# Patient Record
Sex: Female | Born: 1935 | Race: Black or African American | Hispanic: No | Marital: Single | Smoking: Former smoker
Health system: Southern US, Community
[De-identification: ages and names within clinical notes are randomized; demographics above are authoritative.]

## PROBLEM LIST (undated history)

## (undated) DIAGNOSIS — I10 Essential (primary) hypertension: Secondary | ICD-10-CM

## (undated) DIAGNOSIS — R0602 Shortness of breath: Secondary | ICD-10-CM

## (undated) DIAGNOSIS — Z862 Personal history of diseases of the blood and blood-forming organs and certain disorders involving the immune mechanism: Secondary | ICD-10-CM

## (undated) DIAGNOSIS — N189 Chronic kidney disease, unspecified: Secondary | ICD-10-CM

## (undated) DIAGNOSIS — F0280 Dementia in other diseases classified elsewhere without behavioral disturbance: Secondary | ICD-10-CM

## (undated) DIAGNOSIS — I739 Peripheral vascular disease, unspecified: Secondary | ICD-10-CM

## (undated) DIAGNOSIS — D631 Anemia in chronic kidney disease: Secondary | ICD-10-CM

## (undated) DIAGNOSIS — G309 Alzheimer's disease, unspecified: Secondary | ICD-10-CM

## (undated) DIAGNOSIS — I779 Disorder of arteries and arterioles, unspecified: Secondary | ICD-10-CM

## (undated) DIAGNOSIS — E785 Hyperlipidemia, unspecified: Secondary | ICD-10-CM

## (undated) DIAGNOSIS — N9089 Other specified noninflammatory disorders of vulva and perineum: Secondary | ICD-10-CM

## (undated) DIAGNOSIS — I6381 Other cerebral infarction due to occlusion or stenosis of small artery: Secondary | ICD-10-CM

## (undated) DIAGNOSIS — Z951 Presence of aortocoronary bypass graft: Secondary | ICD-10-CM

## (undated) DIAGNOSIS — N186 End stage renal disease: Secondary | ICD-10-CM

## (undated) DIAGNOSIS — I129 Hypertensive chronic kidney disease with stage 1 through stage 4 chronic kidney disease, or unspecified chronic kidney disease: Secondary | ICD-10-CM

## (undated) DIAGNOSIS — I255 Ischemic cardiomyopathy: Secondary | ICD-10-CM

## (undated) DIAGNOSIS — Z8709 Personal history of other diseases of the respiratory system: Secondary | ICD-10-CM

## (undated) DIAGNOSIS — I2510 Atherosclerotic heart disease of native coronary artery without angina pectoris: Secondary | ICD-10-CM

## (undated) DIAGNOSIS — I340 Nonrheumatic mitral (valve) insufficiency: Secondary | ICD-10-CM

## (undated) DIAGNOSIS — M1990 Unspecified osteoarthritis, unspecified site: Secondary | ICD-10-CM

## (undated) DIAGNOSIS — I219 Acute myocardial infarction, unspecified: Secondary | ICD-10-CM

## (undated) HISTORY — DX: Hyperlipidemia, unspecified: E78.5

## (undated) HISTORY — DX: Personal history of diseases of the blood and blood-forming organs and certain disorders involving the immune mechanism: Z86.2

## (undated) HISTORY — DX: Disorder of arteries and arterioles, unspecified: I77.9

## (undated) HISTORY — DX: End stage renal disease: N18.6

## (undated) HISTORY — PX: OTHER SURGICAL HISTORY: SHX169

## (undated) HISTORY — DX: Atherosclerotic heart disease of native coronary artery without angina pectoris: I25.10

## (undated) HISTORY — DX: Personal history of other diseases of the respiratory system: Z87.09

## (undated) HISTORY — DX: Anemia in chronic kidney disease: D63.1

## (undated) HISTORY — DX: Peripheral vascular disease, unspecified: I73.9

## (undated) HISTORY — PX: CHOLECYSTECTOMY: SHX55

## (undated) HISTORY — DX: Chronic kidney disease, unspecified: N18.9

## (undated) HISTORY — DX: Other cerebral infarction due to occlusion or stenosis of small artery: I63.81

## (undated) HISTORY — DX: Nonrheumatic mitral (valve) insufficiency: I34.0

## (undated) HISTORY — DX: Ischemic cardiomyopathy: I25.5

## (undated) HISTORY — DX: Presence of aortocoronary bypass graft: Z95.1

## (undated) HISTORY — DX: Hypertensive chronic kidney disease with stage 1 through stage 4 chronic kidney disease, or unspecified chronic kidney disease: I12.9

## (undated) HISTORY — DX: Unspecified osteoarthritis, unspecified site: M19.90

---

## 1992-05-13 DIAGNOSIS — I6381 Other cerebral infarction due to occlusion or stenosis of small artery: Secondary | ICD-10-CM

## 1992-05-13 HISTORY — DX: Other cerebral infarction due to occlusion or stenosis of small artery: I63.81

## 1995-05-14 DIAGNOSIS — Z951 Presence of aortocoronary bypass graft: Secondary | ICD-10-CM

## 1995-05-14 DIAGNOSIS — I2510 Atherosclerotic heart disease of native coronary artery without angina pectoris: Secondary | ICD-10-CM

## 1995-05-14 HISTORY — DX: Atherosclerotic heart disease of native coronary artery without angina pectoris: I25.10

## 1995-05-14 HISTORY — PX: CORONARY ARTERY BYPASS GRAFT: SHX141

## 1995-05-14 HISTORY — DX: Presence of aortocoronary bypass graft: Z95.1

## 1995-12-03 DIAGNOSIS — I219 Acute myocardial infarction, unspecified: Secondary | ICD-10-CM

## 1995-12-03 HISTORY — DX: Acute myocardial infarction, unspecified: I21.9

## 1999-05-14 HISTORY — PX: AV FISTULA PLACEMENT: SHX1204

## 2004-08-06 ENCOUNTER — Encounter: Admission: RE | Admit: 2004-08-06 | Discharge: 2004-08-06 | Payer: Self-pay | Admitting: Nephrology

## 2006-10-08 ENCOUNTER — Encounter (HOSPITAL_COMMUNITY): Admission: RE | Admit: 2006-10-08 | Discharge: 2007-01-06 | Payer: Self-pay | Admitting: Nephrology

## 2006-12-03 ENCOUNTER — Ambulatory Visit: Payer: Self-pay | Admitting: Vascular Surgery

## 2006-12-17 ENCOUNTER — Ambulatory Visit (HOSPITAL_COMMUNITY): Admission: RE | Admit: 2006-12-17 | Discharge: 2006-12-17 | Payer: Self-pay | Admitting: Vascular Surgery

## 2006-12-17 ENCOUNTER — Ambulatory Visit: Payer: Self-pay | Admitting: Vascular Surgery

## 2007-01-27 ENCOUNTER — Ambulatory Visit: Payer: Self-pay | Admitting: Vascular Surgery

## 2008-01-24 ENCOUNTER — Inpatient Hospital Stay (HOSPITAL_COMMUNITY): Admission: EM | Admit: 2008-01-24 | Discharge: 2008-01-24 | Payer: Self-pay | Admitting: Emergency Medicine

## 2008-03-10 ENCOUNTER — Encounter: Admission: RE | Admit: 2008-03-10 | Discharge: 2008-03-10 | Payer: Self-pay | Admitting: Cardiovascular Disease

## 2008-03-13 DIAGNOSIS — I739 Peripheral vascular disease, unspecified: Secondary | ICD-10-CM

## 2008-03-13 HISTORY — PX: FEMORAL ARTERY STENT: SHX1583

## 2008-03-13 HISTORY — DX: Peripheral vascular disease, unspecified: I73.9

## 2008-03-29 ENCOUNTER — Inpatient Hospital Stay (HOSPITAL_COMMUNITY): Admission: RE | Admit: 2008-03-29 | Discharge: 2008-03-30 | Payer: Self-pay | Admitting: Cardiology

## 2009-03-16 ENCOUNTER — Ambulatory Visit (HOSPITAL_COMMUNITY): Admission: RE | Admit: 2009-03-16 | Discharge: 2009-03-16 | Payer: Self-pay | Admitting: Nephrology

## 2009-04-13 ENCOUNTER — Ambulatory Visit: Payer: Self-pay | Admitting: Vascular Surgery

## 2009-05-16 ENCOUNTER — Ambulatory Visit (HOSPITAL_COMMUNITY): Admission: RE | Admit: 2009-05-16 | Discharge: 2009-05-16 | Payer: Self-pay | Admitting: Vascular Surgery

## 2009-05-16 ENCOUNTER — Ambulatory Visit: Payer: Self-pay | Admitting: Surgery

## 2009-06-15 ENCOUNTER — Ambulatory Visit: Payer: Self-pay | Admitting: Vascular Surgery

## 2010-01-23 ENCOUNTER — Ambulatory Visit (HOSPITAL_COMMUNITY): Admission: RE | Admit: 2010-01-23 | Discharge: 2010-01-23 | Payer: Self-pay | Admitting: Nephrology

## 2010-03-01 ENCOUNTER — Ambulatory Visit: Payer: Self-pay | Admitting: Vascular Surgery

## 2010-03-13 ENCOUNTER — Ambulatory Visit: Payer: Self-pay | Admitting: Vascular Surgery

## 2010-03-13 ENCOUNTER — Ambulatory Visit (HOSPITAL_COMMUNITY): Admission: RE | Admit: 2010-03-13 | Discharge: 2010-03-13 | Payer: Self-pay | Admitting: Vascular Surgery

## 2010-03-29 ENCOUNTER — Ambulatory Visit: Payer: Self-pay | Admitting: Vascular Surgery

## 2010-05-31 ENCOUNTER — Ambulatory Visit
Admission: RE | Admit: 2010-05-31 | Discharge: 2010-05-31 | Payer: Self-pay | Source: Home / Self Care | Attending: Vascular Surgery | Admitting: Vascular Surgery

## 2010-06-01 NOTE — Assessment & Plan Note (Signed)
OFFICE VISIT  Courtney, [name] DOB:  04-27-1931                                       05/31/2010 ZOXWR#:[id number]  The patient returns for followup today after placement of her left brachiocephalic AV fistula on November 1.  She reports no signs of steal in left arm.  This was originally done in preparation for ligation of the right arm AV fistula which has aneurysmal degeneration.  PHYSICAL EXAM:  Blood pressure is 186/70 in the left arm, temperature is 97, heart rate 61 and regular.  Left upper extremity incision is well- healed.  There is an easily palpable thrill in the fistula.  There is an audible bruit.  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  Overall the left arm AV fistula appears to be maturing well at this point.  She will follow up with [place] in six weeks' time.  If they are successfully cannulating her fistula at that time we will consider ligating the right arm AV fistula.    [name] Garin Mata, MD Electronically Signed  CEF/MEDQ  D:  05/31/2010  T:  06/01/2010  Job:  [id number]  cc:   Terrial Rhodes, M.D.

## 2010-07-24 LAB — POCT I-STAT 4, (NA,K, GLUC, HGB,HCT): Potassium: 3.6 mEq/L (ref 3.5–5.1)

## 2010-07-26 ENCOUNTER — Ambulatory Visit: Payer: Self-pay | Admitting: Vascular Surgery

## 2010-07-29 LAB — POCT I-STAT 4, (NA,K, GLUC, HGB,HCT)
Glucose, Bld: 105 mg/dL — ABNORMAL HIGH (ref 70–99)
HCT: 45 % (ref 36.0–46.0)
Hemoglobin: 15.3 g/dL — ABNORMAL HIGH (ref 12.0–15.0)
Potassium: 4.4 mEq/L (ref 3.5–5.1)

## 2010-09-25 NOTE — Consult Note (Signed)
NAME:  Courtney [name], Courtney [name] NO.:  [id number]   MEDICAL RECORD NO.:  [id number]          PATIENT TYPE:  EMS   LOCATION:  MAJO                         FACILITY:  [facility]   PHYSICIAN:  Renee Ramus, MD       DATE OF BIRTH:  Jan 07, 1931   DATE OF CONSULTATION:  DATE OF DISCHARGE:                                 CONSULTATION   REASON FOR CONSULT:  The patient is a 77 year old female who complained  of some chest pressure that she says was consistent with previous  episodes of chest pressure.  The patient was at a wedding today.  She  did not take my heart pills, which probably means carvedilol.  The  patient had some chest pressure, but she noticed obliquely.  She stated  at the worse it was 4-5/10.  The patient reports that her family made  her go to the emergency department and she wants to go home.  The  patient denies any dyspnea, cough, diaphoresis, fever, chills, night  sweats, or PND.  The patient states that currently her pain is gone and  that she wishes to go home.  Called to see the patient for further  evaluation and treatment.   PAST MEDICAL HISTORY:  1. Chronic renal failure, hemodialysis dependent on Monday, Wednesday,      and Friday.  2. Coronary artery disease, status post three-vessel coronary artery      bypass graft.  3. Hypertension.  4. History of anemia.  5. Secondary hyperparathyroid.  6. Hyperlipidemia.  7. Glaucoma.  8. Gout.   FAMILY HISTORY:  Not available.   SOCIAL HISTORY:  No alcohol or tobacco use.  The patient lives with  family.   DRUG ALLERGIES:  NKDA.   CURRENT MEDICATIONS:  1. Carvedilol 12.5 mg 1 p.o. b.i.d.  2. Amlodipine, which has been discontinued.  3. Colchicine and allopurinol, which she takes p.r.n. only.  4. Crestor 20 mg p.o. daily.   The patient also takes other medications.  When questioned, she admits  to taking;  1. Nephro-Vite.  2. Renagel, but does not admit to taking aspirin.   REVIEW OF SYSTEMS:  All  other comprehensive review of systems are  negative.   PHYSICAL EXAMINATION:  GENERAL:  This is a well-developed, well-  nourished elderly black female currently in no apparent distress.  VITAL SIGNS:  Temperature 97.8, pulse 68, respiratory rate 25, blood  pressure 136/61.  HEENT:  No jugular venous distention or lymphadenopathy.  Oropharynx is  clear.  Mucous membranes pink and moist.  TMs are clear bilaterally.  Pupils equal, reactive to light and accommodation.  Extraocular muscles  are intact.  CARDIOVASCULAR:  Regular rate and rhythms without murmurs, rubs, or  gallops.  PULMONARY:  Lungs are clear to auscultation bilaterally.  ABDOMEN:  Soft, nontender, and nondistended without hepatosplenomegaly.  Bowel sounds are present.  She has no rebound or guarding.  EXTREMITIES:  She has no clubbing, cyanosis, or edema.  She has good  peripheral pulses in dorsalis pedis and radial artery.  She is able to  move all extremities.  NEUROLOGIC:  Cranial nerves  II through XII are grossly intact.  She has  no focal neurological deficits.   STUDIES:  1. EKG shows normal sinus rhythm with right atrial enlargement.  2. Portable chest x-ray shows no acute cardiopulmonary disease with      evidence of previous coronary artery bypass graft.   LABORATORY DATA:  White count 4.4, H&H 17.3 and 51, MCV 87, platelets  155.  Sodium 136, potassium 4.8, chloride 106, bicarb 20, BUN 37,  creatinine 7.8, glucose 115, troponin I 0.07.   ASSESSMENT AND PLAN:  1. Chest pain.  I do not believe this represents acute coronary      syndrome, briefly discussed with Cardiology and the patient is to      return home.  Follow up with primary care for any further risks      stratification.  We will treat this problem medically and if her      symptoms recur in any degree, she will return to the emergency      department.  Discussed this at length with the patient and the      patient's family.  They both agree that  this is the course of      action that they wish to take.  They understand it.  I am not      completely certain that her heart is not involved, but this is my      best estimate and they are very comfortable with that.  2. End-stage renal disease with hemodialysis dependent.  This is      stable.  She will continue with hemodialysis.  3. Coronary artery disease as above.  Would add aspirin 81 mg p.o.      daily to regimen.  4. Hypertension, currently stable.  5. Anemia.  The patient currently has erythrocytosis, which may or may      not be secondary to erythropoietin given dialysis.  She has a      history of anemia and this may or may not be the case.  In any      case, I will defer this to her nephrologist or primary care doctor      for further evaluation and treatment.  6. Secondary hyperparathyroidism.  I believe this is adequately      covered by her nephrologist.  7. Hyperlipidemia.  The patient will continue on Crestor.   DISPOSITION:  Discharged to home.   Consult note was constructed by reviewing past medical history,  confirming with emergency medical room physician, and reviewing the  emergency room medical records.   TIME SPENT:  One hour.      Renee Ramus, MD  Electronically Signed     JF/MEDQ  D:  01/23/2008  T:  01/24/2008  Job:  [id number]

## 2010-09-25 NOTE — Consult Note (Signed)
VASCULAR SURGERY CONSULTATION   Courtney [name], Courtney [name]  DOB:  12-10-1930                                       12/03/2006  LZJQB#:[id number]   I saw the patient in the office today in consultation for hemodialysis  access.   She was referred by Dr. Kathrene Bongo.   This is a pleasant 77 year old, left handed woman, with chronic renal  insufficiency secondary to hypertension.  Her renal function has been  gradually worsening and she was sent for hemodialysis access evaluation.   PAST MEDICAL HISTORY:  1. Hypertension.  2. Anemia.  3. Secondary hyperparathyroidism.   REVIEW OF SYSTEMS:  CARDIAC:  She does admit to some dyspnea on  exertion.  She has had no recent chest pain or chest pressure. Review of  systems is otherwise documented on her medical history form and on  chart.   PHYSICAL EXAMINATION:  Vital signs:  Blood pressure is 171/78 on the  left, 180/79 on the right.  Lungs:  Clear bilaterally to auscultation.  Cardiac:  She has a regular rate and rhythm.  Abdomen:  Soft and  nontender.  She has a palpable brachial and radial pulses bilaterally.   Vein mapping shows that the upper arm cephalic vein on the right is  reasonable size and she may be a candidate for an upper arm fistula.   I have recommended that we explore upper arm cephalic vein on the right.  If this is adequate, we will place an AV fistula.  If this is not  adequate, then we would place an AV graft.  We have discussed the  indications for the procedure and the potential complications including,  but not limited to, failure of the fistula to mature, graft thrombosis,  graft infection, Steel syndrome.  All of her questions were answered and  she is agreeable to proceed.   Her surgery has been scheduled for Wednesday, August 6th.   Di Kindle. Edilia Bo, M.D.  Electronically Signed  CSD/MEDQ  D:  12/03/2006  T:  12/04/2006  Job:  [id number]

## 2010-09-25 NOTE — Procedures (Signed)
CEPHALIC VEIN MAPPING   INDICATION:  End-stage renal disease.   HISTORY:   EXAM:  Map, right upper extremity, for possible AVF.   The right cephalic vein is compressible.   Diameter measurements range from 0.17 to 0.33 cm in the forearm.  The  cephalic vein measures 0.3 cm to 0.33 cm in the upper arm.   The left cephalic vein is not evaluated.  See attached worksheet for all  measurements.   See attached worksheet for all measurements.   IMPRESSION:  Patient right cephalic vein which is of acceptable diameter  for use as a dialysis access site in the upper arm.   ___________________________________________  Di Kindle. Edilia Bo, M.D.   AR/MEDQ  D:  12/03/2006  T:  12/03/2006  Job:  [id number]

## 2010-09-25 NOTE — Assessment & Plan Note (Signed)
OFFICE VISIT   OKSANA, DEBERRY  DOB:  Mar 17, 1931                                       03/29/2010  OZHYQ#:[id number]   The patient returns for followup today.  She had a left brachiocephalic  AV fistula placed on November 1st.  This was done in preparation for a  failing fistula on the right side.  She has had some aneurysmal  degeneration of the right arm AV fistula.  She currently is dialyzing  Monday, Wednesday, and Friday.   PHYSICAL EXAM:  Today, blood pressure 180/75 in the right arm.  Heart  rate is 58 and regular.  She still has an aneurysm approximately 3.5 cm  in the proximal aspect of the right AV fistula.  Skin has not thinned.  There is no ulceration.  They are currently sticking the fistula above  this aneurysmal section.   In the left upper extremity, there is an easily palpable fistula which  is approximately 4 mm in diameter.  There is an easily palpable thrill.  There is no evidence of steal in the left hand.   Overall, the patient is doing well at this point.  The left arm fistula  is not ready for use at this point.  I will see her back in 2 months'  time.  Hopefully, this will continue to develop and be ready for use in  a few months, and then we can consider whether or not the right arm AV  fistula should be ligated.     [name] Fields, MD  Electronically Signed   CEF/MEDQ  D:  03/29/2010  T:  03/30/2010  Job:  [id number]   cc:   Terrial Rhodes, M.D.

## 2010-09-25 NOTE — Op Note (Signed)
NAME:  Courtney [name], Courtney [name]             ACCOUNT NO.:  [id number]   MEDICAL RECORD NO.:  [id number]          PATIENT TYPE:  AMB   LOCATION:  SDS                          FACILITY:  [facility]   PHYSICIAN:  Di Kindle. Edilia Bo, M.D.DATE OF BIRTH:  05-26-1930   DATE OF PROCEDURE:  12/17/2006  DATE OF DISCHARGE:                               OPERATIVE REPORT   PREOPERATIVE DIAGNOSIS:  Chronic renal failure.   POSTOPERATIVE DIAGNOSIS:  Chronic renal failure.   PROCEDURE:  Placement of new right upper arm arteriovenous fistula.   SURGEON:  Di Kindle. Edilia Bo, M.D., nurse assistant.   ANESTHESIA:  Local with sedation.   TECHNIQUE:  The patient was taken to the operating room and sedated by  anesthesia.  The right upper extremity was prepped and draped in usual  sterile fashion.  After the skin was infiltrated with 1% lidocaine, the  incision was made at the antecubital level where the cephalic vein was  dissected free.  It distended up nicely with heparinized saline after it  was ligated distally and divided.  The brachial artery was reasonable  sized.  The patient was heparinized.  The brachial artery was clamped  proximally and distally and a longitudinal arteriotomy was made.  The  vein was mobilized over and sewn end-to-side to the artery using  continuous 6-0 Prolene suture.  At the completion there was an excellent  thrill in the fistula and a palpable radial pulse.  Hemostasis was  obtained.  The wound was closed with deep layer of 3-0 Vicryl.  The skin  closed with 4-0 Vicryl.  Sterile dressing was applied.  The patient  tolerated procedure well was transferred to recovery room in  satisfactory condition.  All needle and sponge counts were correct.      Di Kindle. Edilia Bo, M.D.  Electronically Signed     CSD/MEDQ  D:  12/17/2006  T:  12/17/2006  Job:  [id number]

## 2010-09-25 NOTE — Cardiovascular Report (Signed)
NAME:  Courtney [name], Courtney [name]             ACCOUNT NO.:  [id number]   MEDICAL RECORD NO.:  [id number]          PATIENT TYPE:  INP   LOCATION:  3703                         FACILITY:  [facility]   PHYSICIAN:  Cristy Hilts. Jacinto Halim, MD       DATE OF BIRTH:  15-Nov-1930   DATE OF PROCEDURE:  03/29/2008  DATE OF DISCHARGE:                            CARDIAC CATHETERIZATION   PROCEDURES PERFORMED:  1. Abdominal aortogram.  2. Pelvic aortogram.  3. Abdominal aortogram with bifemoral runoff.  4. Crossover from the left common femoral artery into the right common      femoral artery and placement of catheter into the right common      femoral artery and right femoral arteriogram with distal runoff.  5. Percutaneous transluminal angioplasty and stenting of the right      superficial femoral artery.   INDICATIONS:  Ms. Korina Tretter is a 77 year old female with  hypertension, end-stage renal disease with severe lifestyle-limiting  claudication with severe peripheral arterial disease with ABI of 0.4 on  the right and 0.5 on the left.  She had known long segment superficial  femoral artery disease by catheterization done remotely.  She also has  end-stage renal disease and presently hemodialysis.  She has history of  known coronary disease and is status post CABG.  She is now brought to  the catheterization lab to evaluate her peripheral anatomy.   ABDOMINAL AORTOGRAM:  Abdominal aortogram revealed moderate amount of  calcification throughout the abdominal aorta.  Distal abdominal aorta  showed mild-to-moderate amount of atherosclerotic changes.   The renal arteries showed diffuse disease and right renal artery showed  a 80-90% proximal stenosis.  The renal arteries themselves were atrophic  and renal parenchyma was also atrophic.  The aortoiliac bifurcation was  widely patent with a calcific 30-40% stenosis of the right common iliac  artery.   ABDOMINAL AORTOGRAM WITH BIFEMORAL RUNOFF:  The abdominal  aortogram with  bifemoral runoff revealed the left SFA to show a focal short segment  occlusion and is collateralized.  The proximal segment of the SFA prior  to occlusion has a 40-50% stenosis.  Otherwise, there is mild  calcification noted.   Below the left knee, the anterior tibial artery is occluded and there is  2-vessel runoff noted in the left leg.   The left common and external iliac artery were widely patent.   At the right lower extremity, right common iliac and right external  iliac artery showed mild diffuse luminal irregularity with mild  calcification.   The right superficial femoral artery throughout the Hunter's canal had  high-grade tandem 80-99% stenosis.   Below the right knee, the distal popliteal artery showed a focal 70-80%  stenosis followed by 1-vessel runoff in the form of peroneal artery.  Posterior tibial and anterior tibial artery are occluded and they  reconstitute at the level of the ankle minimally.   INTERVENTION DATA:  Successful PTA and stenting of the right superficial  femoral artery with implantation of a 6.0 x 150 mm and overlapped with a  6.0 x 100 mm SMART self-expanding stent.  Overall,  the stenosis was  reduced from 99% to 0% with brisk flow noted throughout the superficial  femoral artery.  The 1-vessel runoff was well maintained at the end of  the procedure below the right knee.   A total of 135 mL of contrast was utilized for diagnostic and  interventional procedure.   TECHNICAL PROCEDURE:  Under usual sterile precautions using a 5-French  left femoral artery access, a 5-French Omniflush catheter was advanced  to the abdominal and abdominal aortogram was performed.  Then the  abdominal aortogram with bifemoral runoff was also performed.   TECHNIQUE OF INTERVENTION:  Using the Omni flush catheter, I was able to  engage the right common iliac artery from the left and using the  Versacore wire, I was able to cross from the left femoral  artery into  the right common femoral artery.  I placed the catheter into the right  common femoral artery and right femoral arteriogram with distal runoff  was also performed for selective visualization of the right lower  extremity.  Then using a 7-French Brite Tip Pinnacle sheath, I was able  to cross and place the tip of the catheter into the right common femoral  artery.  Rest of the procedure was done using the Versacore and the  distal stenosis in the right superficial femoral artery was unable to be  crossed.  Hence, I had to use a short glide 0.035-inch Glidewire to  cross into the distal superficial femoral artery.  Having performed  this, the Versacore wire was re-advanced back into the popliteal artery  and using a 4.0 x 100 mm Powerflex balloon, multiple inflations were  performed throughout the right SFA.  Then a 6.0 x 150 mm SMART was  placed distally and overlapped with a 6.0 x 100 mm SMART stent  proximally into the right superficial femoral artery.  Having performed  this, the same 4-mm balloon was utilized for post-stent implantation  balloon angioplasty which was performed throughout the stented segment  and after giving 12 mcg of intra-arterial nitroglycerin angiography was  performed.  Excellent results were noted with maintenance of 1-vessel  brisk flow into the right lower extremity.  The pinnacle sheath was then  pulled back into the left common femoral artery and exchanged to a short  sheath.  The patient tolerated the procedure well.  There was no  immediate complication noted.  ACT was maintained at greater than 200  during the procedure.      Cristy Hilts. Jacinto Halim, MD  Electronically Signed     JRG/MEDQ  D:  03/29/2008  T:  03/29/2008  Job:  [id number]   cc:   Ritta Slot, MD  Thayer Headings, M.D.

## 2010-09-25 NOTE — Assessment & Plan Note (Signed)
OFFICE VISIT   KAMALJIT, HIZER  DOB:  20-Dec-1930                                       04/13/2009  ZOXWR#:[id number]   I saw the patient in the office today to evaluate a right upper arm  fistula which has reportedly had decreased flow rates.  She had this  upper arm fistula placed in August of 2008 and it has been working well  until recently when she began having decreasing flow rates.  This  prompted a fistulogram which was done on November 4.  This showed no  evidence of central venous stenosis and no real problems with the  fistula except that the proximal aspect was aneurysmal.  Of note, they  could not evaluate the arterial anastomosis as they were unable to  reflux dye into the artery because of some competing branches  proximally.  She was sent over to have the fistula evaluated.  Of note,  she states that from her standpoint the fistula has been working well  and she had had no pain associated with the fistula.   REVIEW OF SYSTEMS:  She has had no recent chest pain, chest pressure,  palpitations or arrhythmias.   PHYSICAL EXAMINATION:  General:  This is a pleasant 77 year old woman  who appears younger than her stated age.  Vital signs:  Her blood  pressure is 148/70, heart rate is 55.  Temperature is 97.6.  Lungs:  Are  clear bilaterally to auscultation.  Cardiac:  She has a regular rate and  rhythm.  Her fistula has an excellent thrill and is not especially  pulsatile nor is the flow diminished to suggest an anastomotic stenosis.  She does have one possibly two competing branches proximally which when  these are compressed does appear to augment flow within the fistula  somewhat.   I have recommended that we ligate these competing branches as I think  this may help the flow in her fistula.  She does have an aneurysm  proximally but there really are not any great options to address this  without simply plicating the aneurysm which could  potentially cause  problems and I certainly would not recommend placing an interposition  prosthetic graft in this area given that the fistula appears to be  working reasonably well and she has had this since 2008.  She is  somewhat reluctant to proceed with surgery although I explained this is  really quite a minor procedure.  However, she is agreeable to proceed  the first of January and she has been scheduled for January 4 for  ligation of these competing branches.  Hopefully this will improve the  function of her fistula.  Again, her fistulogram did not really show any  other significant problems.   Di Kindle. Edilia Bo, M.D.  Electronically Signed   CSD/MEDQ  D:  04/13/2009  T:  04/14/2009  Job:  [id number]   cc:   Terrial Rhodes, M.D.

## 2010-09-25 NOTE — Assessment & Plan Note (Signed)
OFFICE VISIT   TABATHA, RAZZANO  DOB:  August 03, 1930                                       03/01/2010  HQION#:[id number]   CHIEF COMPLAINT:  Aneurysm right arm AV fistula.   HISTORY OF PRESENT ILLNESS:  The patient is a 77 year old female who  previously had a right brachiocephalic AV fistula placed by Dr. Edilia Bo  in August of 2008.  She had revision of this fistula in January of 2011  with ligation of side branches.  She apparently has developed aneurysmal  degeneration of the proximal aspect of the fistula over the last month  or so.  The patient denies any bleeding episodes from the fistula.  She  said it has been slowly been getting bigger but not acutely.   ROS: denies shortness of breath or chest pain   PE: 2 segments of aneurysmal degeneration right arm no thinned skin  proximal aneurysm 3.5 cm upper 2.5 cm, palpable thrill, no ulcer   Chest clear to auscultation, Cardiac regular rate   I believe the best option at this point would be to place a new left  brachiocephalic AVF since the right will probably continue to grow or  fail.  We will consider ligation of the aneurysmal fistula when the left  is mature.  Risks benefits of left fistula explained to patient.     [name] Fields, MD  Electronically Signed   CEF/MEDQ  D:  03/01/2010  T:  03/02/2010  Job:  [id number]   cc:   Terrial Rhodes, M.D.

## 2010-09-25 NOTE — Assessment & Plan Note (Signed)
OFFICE VISIT   Courtney [name], Courtney [name]  DOB:  05/11/1931                                       06/15/2009  OZHYQ#:[id number]    I saw the patient in the office today for followup after revision of her  right upper arm AV fistula.  On 05/16/2009 I ligated two competing  branches of her fistula and did a bovine pericardial patch with a vein  graft stenosis in the upper end of the graft.  She comes in for a  routine check.   PHYSICAL EXAMINATION:  On examination the fistula has a good thrill and  she states that it has been functioning adequately.  Incisions are  healed well and she has a palpable radial pulse.  Hopefully this will  improve the performance of her fistula.  If there are problems we would  have to consider new access.  We will see her back p.r.n.     Di Kindle. Edilia Bo, M.D.  Electronically Signed   CSD/MEDQ  D:  06/15/2009  T:  06/16/2009  Job:  [id number]

## 2010-09-25 NOTE — Discharge Summary (Signed)
NAME:  Courtney [name], Courtney [name]             ACCOUNT NO.:  [id number]   MEDICAL RECORD NO.:  [id number]          PATIENT TYPE:  INP   LOCATION:  4705                         FACILITY:  [facility]   PHYSICIAN:  Eduard Clos, MDDATE OF BIRTH:  1931/01/02   DATE OF ADMISSION:  01/23/2008  DATE OF DISCHARGE:  01/24/2008                               DISCHARGE SUMMARY   COURSE IN THE HOSPITAL:  A 77 year old female with the history of ESRD  on hemodialysis, CAD status post CABG, and hypertension, who presented  with complaints of chest pressure.  The patient was admitted to a  telemetry floor and serial cardiac enzymes and EKGs were done.  Her  troponin was slightly elevated around 1.6 and repeat one was 0.91.  EKG  did not show any acute finding and was comparable to the old EKG.  Nephrology was consulted for possible dialysis needs.  At this time, the  patient's symptoms have largely resolved and the patient is eager to go  home, and we advised that the patient will need a stress test and the  patient and the patient's family have agreed that they will follow up  with Dr. Aleen Campi, Cardiology, as soon as possible.  He wants to be  discharged tomorrow and call them to get an early appointment within a  week or two.  At this time, this patient is very eager to go home and is  chest pain free.  The patient will be discharged home and was advised to  follow up with the ER, if there is any recurrence of symptoms.   FINAL DIAGNOSES:  1. Chest pain, resolved.  2. History of coronary artery disease status post coronary artery      bypass graft.  3. History of end-stage renal disease on hemodialysis.  4. Hypertension.  5. History of anemia secondary to chronic kidney disease.   MEDICATIONS ON DISCHARGE:  1. Crestor 10 mg p.o. daily.  2. Carvedilol 12.5 mg p.o. b.i.d.  3. Amlodipine 10 mg p.o. daily.  4. Allopurinol.  5. Colchicine as needed.  6. PhosLo 667 mg p.o. t.i.d. with meals.  7.  Rena-Vite 1 tablet p.o. daily.   ALLERGIES:  The patient is allergic to ASPIRIN.   PLAN:  The patient is advised to follow up with her primary care  physician within a week's time to get dialysis screened by nephrologist.  To follow up with her cardiologist with in a week's time and probably  need a stress test.  To be on cardiac healthy, renal diet.  In the event  of any recurrence of symptoms, to go to the nearest ER.     Eduard Clos, MD  Electronically Signed    ANK/MEDQ  D:  01/24/2008  T:  01/25/2008  Job:  [id number]

## 2010-09-25 NOTE — Assessment & Plan Note (Signed)
OFFICE VISIT   Courtney [name], Courtney [name]  DOB:  Jul 29, 1930                                       01/27/2007  ZOXWR#:[id number]   I saw Courtney [name] in the office today for follow-up after placement of  her right upper arm AV fistula on 12/17/06.  She comes in for a routine  follow-up visit.  She has no specific complaints referable to the  fistula.  She has had no right arm pain.   On physical examination blood pressure is 152/61, heart rate is 67.  The  AV fistula in her right upper arm has an excellent thrill and is  enlarging nicely.  She has a palpable radial pulse.  I think this will  be adequate for dialysis if she requires it.  I will be happy to see her  back p.r.n.   Di Kindle. Edilia Bo, M.D.  Electronically Signed   CSD/MEDQ  D:  01/27/2007  T:  01/28/2007  Job:  [id number]

## 2010-09-28 NOTE — Discharge Summary (Signed)
NAME:  Courtney [name], Courtney [name]             ACCOUNT NO.:  [id number]   MEDICAL RECORD NO.:  [id number]          PATIENT TYPE:  INP   LOCATION:  3703                         FACILITY:  [facility]   PHYSICIAN:  Cristy Hilts. Jacinto Halim, MD       DATE OF BIRTH:  1930/06/06   DATE OF ADMISSION:  03/29/2008  DATE OF DISCHARGE:  03/30/2008                               DISCHARGE SUMMARY   DISCHARGE DIAGNOSES:  1. Lower extremity claudication, right greater than left.      a.     Peripheral vascular disease with lower extremity arterial       Dopplers revealing right superficial femoral artery with 70-99%       diameter reduction and left superficial femoral artery mid with       absent flow appearing occluded with reconstitution at the distal       segment.  2. Hypertension.  3. End-stage renal disease with hemodialysis 3 times a week.  4. Coronary artery disease with bypass grafting by Dr. Laneta Simmers, 12      years ago.  5. Dyslipidemia.  6. History of anemia secondary to chronic kidney disease.  7. Normal left ventricular function with moderate tricuspid      regurgitation.  8. Peripheral arterial disease with placement of percutaneous      transluminal angioplasty and stent to the right superficial femoral      artery by Dr. Jacinto Halim.   PROCEDURES:  1. March 29, 2008, peripheral arteriogram by Dr. Yates Decamp.  2. March 29, 2008, PTA and stent deployment to the right SFA      secondary to stenosis and symptomatic claudication.   DISCHARGE MEDICATIONS:  1. Carvedilol 12.5 mg twice daily.  2. Crestor 10 mg daily.  3. Pletal 100 mg twice a day.  4. Plavix 75 mg daily.  5. Lumigan drops 1 drop both sides 0.03% daily.  6. Calcium acetate 667 mg 3 times daily.  7. Allopurinol 100 mg as needed.  8. Colchicine 0.6 mg as needed.  9. Rena-Vite 1 daily.  10.Pepcid 20 mg every evening.   DISCHARGE INSTRUCTIONS:  1. Low-sodium, heart-healthy, renal diet.  2. Wash cath site with soap and water.  Call if any  bleeding,      swelling, or drainage.  3. Increase activity slowly.  No shower.  No lifting for 2 days.  No      driving for 2 days.  4. Follow up with Dr. Lynnea Ferrier on April 18, 2008, at 10:45 a.m.  5. She is scheduled for lower extremity arterial Dopplers at Dr.      Donavan Burnet office on the second floor on Wednesday, April 13, 2008, at 10:30 a.m.  6. Resume dialysis as before.   HISTORY OF PRESENT ILLNESS AND HOSPITAL STAY:  A 77 year old female  patient of Dr. Tresa Endo and Dr. Lynnea Ferrier was seen by Dr. Lynnea Ferrier secondary  to lower extremity claudication and abnormal Doppler studies revealing  proximal right SFA with 70-99% diameter reduction seen at the mid and  distal segments.  Other history includes hypertension, end-stage renal  disease, on  hemodialysis 3 times a week, coronary artery disease with  bypass grafting 12 years ago, dyslipidemia, anemia of chronic disease,  negative nuke study in October 2009, echo in October 2009 with normal LV  function with moderate tricuspid regurg and peripheral vascular disease  as described.  The patient also had been a smoker, but stopped 14 years  ago.   The patient was brought in to the hospital for lower extremity angiogram  and plans were to follow up with dialysis post procedure.  The patient  presented, underwent angiogram, tolerated the procedure, and received  PTA and stent to the right SFA.  She did well with one episode of 11  beats of wide complex tachycardia, ventricular tachycardia with a rate  of 170.  The patient was asymptomatic, though she did feel her heart  race a bit.   She underwent hemodialysis on March 30, 2008.  Dr. Darrick Penna saw her.  He felt she was stable.  She was also seen by Dr. Lynnea Ferrier post PV  procedure on March 30, 2008, and he felt she was stable for discharge  home after dialysis.  She was then discharged and will follow up as  previously stated.   PHYSICAL EXAMINATION AT DISCHARGE:  VITAL SIGNS:   Blood pressure prior  to dialysis was 90/40 and 95/44 prior to discharge.  Heart rate was 65,  temp 98.1, 100% saturation of oxygen on room air.  LUNGS:  Clear.  HEART:  Regular rate and rhythm with a 3/6 murmur.  NEUROLOGIC:  Alert and oriented.  EXTREMITIES:  No edema.  Cath site was stable.   LABORATORY DATA:  Hemoglobin prior to discharge 11.7, hematocrit 35.8,  WBC 4.9, and platelets 176.  Sodium 132, potassium 4.8, chloride 98, CO2  of 22, glucose 118, BUN 41, creatinine 10, magnesium 2.7, and creatinine  7.9.   The patient ambulated after dialysis without problems and was ready for  discharge.  No lightheadedness or dizziness prior to her discharge.      Darcella Gasman. [place], N.P.      Cristy Hilts. Jacinto Halim, MD  Electronically Signed    LRI/MEDQ  D:  05/11/2008  T:  05/12/2008  Job:  [id number]   cc:   Cristy Hilts. Jacinto Halim, MD  Dr. Junie Panning, MD  Dr. Darrick Penna

## 2010-11-11 DIAGNOSIS — I340 Nonrheumatic mitral (valve) insufficiency: Secondary | ICD-10-CM

## 2010-11-11 HISTORY — DX: Nonrheumatic mitral (valve) insufficiency: I34.0

## 2011-02-10 ENCOUNTER — Emergency Department (HOSPITAL_COMMUNITY): Payer: Medicare Other

## 2011-02-10 ENCOUNTER — Emergency Department (HOSPITAL_COMMUNITY)
Admission: EM | Admit: 2011-02-10 | Discharge: 2011-02-10 | Disposition: A | Discharge status: Home / Self Care | Payer: Medicare Other | Attending: Emergency Medicine | Admitting: Emergency Medicine

## 2011-02-10 DIAGNOSIS — I2510 Atherosclerotic heart disease of native coronary artery without angina pectoris: Secondary | ICD-10-CM | POA: Insufficient documentation

## 2011-02-10 DIAGNOSIS — I129 Hypertensive chronic kidney disease with stage 1 through stage 4 chronic kidney disease, or unspecified chronic kidney disease: Secondary | ICD-10-CM | POA: Insufficient documentation

## 2011-02-10 DIAGNOSIS — R5381 Other malaise: Secondary | ICD-10-CM | POA: Insufficient documentation

## 2011-02-10 DIAGNOSIS — I509 Heart failure, unspecified: Secondary | ICD-10-CM | POA: Insufficient documentation

## 2011-02-10 DIAGNOSIS — R5383 Other fatigue: Secondary | ICD-10-CM | POA: Insufficient documentation

## 2011-02-10 DIAGNOSIS — R0602 Shortness of breath: Secondary | ICD-10-CM | POA: Insufficient documentation

## 2011-02-10 DIAGNOSIS — N189 Chronic kidney disease, unspecified: Secondary | ICD-10-CM | POA: Insufficient documentation

## 2011-02-10 DIAGNOSIS — I519 Heart disease, unspecified: Secondary | ICD-10-CM | POA: Insufficient documentation

## 2011-02-10 LAB — DIFFERENTIAL
Basophils Absolute: 0 K/uL (ref 0.0–0.1)
Basophils Relative: 0 % (ref 0–1)
Eosinophils Absolute: 0 K/uL (ref 0.0–0.7)
Eosinophils Relative: 1 % (ref 0–5)
Lymphocytes Relative: 8 % — ABNORMAL LOW (ref 12–46)
Lymphs Abs: 0.4 K/uL — ABNORMAL LOW (ref 0.7–4.0)
Monocytes Absolute: 0.3 K/uL (ref 0.1–1.0)
Monocytes Relative: 6 % (ref 3–12)
Neutro Abs: 4.1 K/uL (ref 1.7–7.7)
Neutrophils Relative %: 85 % — ABNORMAL HIGH (ref 43–77)

## 2011-02-10 LAB — CBC
MCV: 80.6 fL (ref 78.0–100.0)
RBC: 3.56 MIL/uL — ABNORMAL LOW (ref 3.87–5.11)
RDW: 13.8 % (ref 11.5–15.5)

## 2011-02-10 LAB — CK TOTAL AND CKMB (NOT AT ARMC)
CK, MB: 4 ng/mL (ref 0.3–4.0)
Relative Index: 1.4 (ref 0.0–2.5)
Total CK: 294 U/L — ABNORMAL HIGH (ref 7–177)

## 2011-02-10 LAB — POCT I-STAT TROPONIN I: Troponin i, poc: 0.03 ng/mL (ref 0.00–0.08)

## 2011-02-10 LAB — OCCULT BLOOD, POC DEVICE: Fecal Occult Bld: NEGATIVE

## 2011-02-10 LAB — BASIC METABOLIC PANEL
BUN: 91 mg/dL — ABNORMAL HIGH (ref 6–23)
Calcium: 9.1 mg/dL (ref 8.4–10.5)
Chloride: 96 mEq/L (ref 96–112)
Creatinine, Ser: 10.4 mg/dL — ABNORMAL HIGH (ref 0.50–1.10)
GFR calc non Af Amer: 4 mL/min — ABNORMAL LOW (ref >60)
Potassium: 4.8 mEq/L (ref 3.5–5.1)

## 2011-02-12 LAB — CBC
HCT: 35.8 — ABNORMAL LOW
MCV: 86.8
RBC: 4.12
RDW: 19.5 — ABNORMAL HIGH

## 2011-02-12 LAB — RENAL FUNCTION PANEL
Albumin: 2.9 — ABNORMAL LOW
BUN: 41 — ABNORMAL HIGH
Chloride: 98
GFR calc non Af Amer: 4 — ABNORMAL LOW
Glucose, Bld: 118 — ABNORMAL HIGH
Phosphorus: 4.8 — ABNORMAL HIGH
Potassium: 4.8
Sodium: 132 — ABNORMAL LOW

## 2011-02-12 LAB — POCT I-STAT, CHEM 8
Potassium: 4.8
Sodium: 140
TCO2: 28

## 2011-02-13 ENCOUNTER — Encounter: Payer: Self-pay | Admitting: Gastroenterology

## 2011-02-13 LAB — APTT: aPTT: 31

## 2011-02-13 LAB — CBC
HCT: 49.3 — ABNORMAL HIGH
Hemoglobin: 16.1 — ABNORMAL HIGH
RBC: 5.61 — ABNORMAL HIGH

## 2011-02-13 LAB — CARDIAC PANEL(CRET KIN+CKTOT+MB+TROPI)
CK, MB: 5.1 — ABNORMAL HIGH
Relative Index: 2.9 — ABNORMAL HIGH
Relative Index: 3.8 — ABNORMAL HIGH
Total CK: 176
Troponin I: 0.91

## 2011-02-13 LAB — DIFFERENTIAL
Basophils Relative: 1
Eosinophils Absolute: 0.1
Lymphs Abs: 1.2
Monocytes Relative: 13 — ABNORMAL HIGH
Neutro Abs: 2.6
Neutrophils Relative %: 58

## 2011-02-13 LAB — CK TOTAL AND CKMB (NOT AT ARMC)
CK, MB: 8.2 — ABNORMAL HIGH
Relative Index: 4 — ABNORMAL HIGH
Total CK: 203 — ABNORMAL HIGH

## 2011-02-13 LAB — COMPREHENSIVE METABOLIC PANEL
CO2: 28
Calcium: 9.6
Chloride: 99
Creatinine, Ser: 8.04 — ABNORMAL HIGH

## 2011-02-13 LAB — POCT I-STAT, CHEM 8
Calcium, Ion: 1.04 — ABNORMAL LOW
Glucose, Bld: 115 — ABNORMAL HIGH
HCT: 51 — ABNORMAL HIGH
Hemoglobin: 17.3 — ABNORMAL HIGH
Potassium: 4.8

## 2011-02-13 LAB — POCT CARDIAC MARKERS
CKMB, poc: 4.7
Myoglobin, poc: >500
Myoglobin, poc: >500

## 2011-02-13 LAB — PROTIME-INR
INR: 1
Prothrombin Time: 13

## 2011-02-25 LAB — CBC
MCV: 81.4
Platelets: 194
WBC: 5.3

## 2011-02-25 LAB — POCT I-STAT 4, (NA,K, GLUC, HGB,HCT)
Glucose, Bld: 95
Operator id: 206361
Sodium: 140

## 2011-02-25 LAB — BASIC METABOLIC PANEL
BUN: 51 — ABNORMAL HIGH
Calcium: 9
Creatinine, Ser: 5.51 — ABNORMAL HIGH
GFR calc non Af Amer: 8 — ABNORMAL LOW

## 2011-03-12 ENCOUNTER — Encounter: Payer: Self-pay | Admitting: Gastroenterology

## 2011-03-12 ENCOUNTER — Ambulatory Visit (INDEPENDENT_AMBULATORY_CARE_PROVIDER_SITE_OTHER): Payer: Medicare Other | Admitting: Gastroenterology

## 2011-03-12 DIAGNOSIS — N289 Disorder of kidney and ureter, unspecified: Secondary | ICD-10-CM

## 2011-03-12 DIAGNOSIS — R630 Anorexia: Secondary | ICD-10-CM

## 2011-03-12 NOTE — Patient Instructions (Signed)
Your appetite has clearly improved since starting megace. Recent fecal occult blood testing was negative. If your appetite starts to diminish again or you have early satiety please call. We will likely proceed with EGD at that point.

## 2011-03-12 NOTE — Progress Notes (Signed)
HPI: This is a   very pleasant 77 year old woman who is here with her daughter today.  She had loss of appetite several weeks ago but no pain, no early satiety. This seems to be around the same time of the significant CHF exacerbation where her BNP level was 70,000.  Fecal occult blood testing was done one month ago and it was negative. She does have anemia but is not microcytic.  Her appetite has improved lately.   She has been on hemodialysis for 3 years.  She has been told to eat more meat by care providers due to anemia.    She was drinking a lot of boost, was in CHF from fluid overload.   She was put on megace 2 weeks ago, she has been eating great since then.  She has been gaining     Review of systems: Pertinent positive and negative review of systems were noted in the above HPI section. Complete review of systems was performed and was otherwise normal.    Past Medical History  Diagnosis Date  . HTN (hypertension)   . Anemia   . Hyperparathyroidism   . Kidney disease   . Glaucoma   . Hyperlipemia     Past Surgical History  Procedure Date  . Av fistula placement   . Cholecystectomy     Current Outpatient Prescriptions  Medication Sig Dispense Refill  . B Complex-C-Folic Acid (DIALYVITE 800 PO) Take 1 capsule by mouth daily.        . carvedilol (COREG) 12.5 MG tablet One tablet by mouth once daily       . clopidogrel (PLAVIX) 75 MG tablet Take 75 mg by mouth daily.        . fosinopril (MONOPRIL) 20 MG tablet One tablet by mouth once daily      . LUMIGAN 0.03 % ophthalmic solution Use as directed       . megestrol (MEGACE) 20 MG tablet One tablet by mouth once daily       . simvastatin (ZOCOR) 40 MG tablet One tablet by mouth once daily         Allergies as of 03/12/2011 - Review Complete 03/12/2011  Allergen Reaction Noted  . Aspirin  03/12/2011    Family History  Problem Relation Age of Onset  . Colon cancer Neg Hx   . Heart disease Sister     History    Social History  . Marital Status: Single    Spouse Name: N/A    Number of Children: 1  . Years of Education: N/A   Occupational History  . Retired    Social History Main Topics  . Smoking status: Former Games developer  . Smokeless tobacco: Never Used  . Alcohol Use: No  . Drug Use: No  . Sexually Active: Not on file   Other Topics Concern  . Not on file   Social History Narrative  . No narrative on file       Physical Exam: BP 124/62  Pulse 60  Ht 5\' 2"  (1.575 m)  Wt 83 lb (37.649 kg)  BMI 15.18 kg/m2 Constitutional: Very thin, chronically ill-appearing woman Psychiatric: alert and oriented x3 Eyes: extraocular movements intact Mouth: oral pharynx moist, no lesions Neck: supple no lymphadenopathy Cardiovascular: heart regular rate and rhythm Lungs: clear to auscultation bilaterally Abdomen: soft, nontender, nondistended, no obvious ascites, no peritoneal signs, normal bowel sounds Extremities: no lower extremity edema bilaterally Skin: no lesions on visible extremities    Assessment and plan:  77 y.o. female with  resolving anorexia  Her appetite has clearly improved since starting Megace. The problem with her appetite seems to have started around the time of the significant CHF exacerbation when her BNP was 70,000. She has had no abdominal pains, no nausea, no early satiety. Fecal occult blood testing was negative. I think it is unlikely she has any gastrointestinal neoplasm. She knows to call if her symptoms worsen again at that point I would likely proceed with EGD. For now no further testing is recommended. She should just scontinue the Megace.

## 2011-04-11 ENCOUNTER — Encounter: Payer: Self-pay | Admitting: Gynecologic Oncology

## 2011-04-11 ENCOUNTER — Ambulatory Visit: Payer: Medicare Other | Attending: Gynecology | Admitting: Gynecologic Oncology

## 2011-04-11 VITALS — HR 74 | Temp 97.6°F | Resp 12 | Ht 62.01 in | Wt 86.7 lb

## 2011-04-11 DIAGNOSIS — E785 Hyperlipidemia, unspecified: Secondary | ICD-10-CM | POA: Insufficient documentation

## 2011-04-11 DIAGNOSIS — N289 Disorder of kidney and ureter, unspecified: Secondary | ICD-10-CM | POA: Insufficient documentation

## 2011-04-11 DIAGNOSIS — D0730 Carcinoma in situ of unspecified female genital organs: Secondary | ICD-10-CM | POA: Insufficient documentation

## 2011-04-11 DIAGNOSIS — E213 Hyperparathyroidism, unspecified: Secondary | ICD-10-CM | POA: Insufficient documentation

## 2011-04-11 DIAGNOSIS — D649 Anemia, unspecified: Secondary | ICD-10-CM | POA: Insufficient documentation

## 2011-04-11 DIAGNOSIS — R634 Abnormal weight loss: Secondary | ICD-10-CM | POA: Insufficient documentation

## 2011-04-11 DIAGNOSIS — D0590 Unspecified type of carcinoma in situ of unspecified breast: Secondary | ICD-10-CM | POA: Insufficient documentation

## 2011-04-11 DIAGNOSIS — H409 Unspecified glaucoma: Secondary | ICD-10-CM | POA: Insufficient documentation

## 2011-04-11 DIAGNOSIS — D071 Carcinoma in situ of vulva: Secondary | ICD-10-CM | POA: Insufficient documentation

## 2011-04-11 DIAGNOSIS — L293 Anogenital pruritus, unspecified: Secondary | ICD-10-CM | POA: Insufficient documentation

## 2011-04-11 DIAGNOSIS — I10 Essential (primary) hypertension: Secondary | ICD-10-CM | POA: Insufficient documentation

## 2011-04-11 DIAGNOSIS — Z79899 Other long term (current) drug therapy: Secondary | ICD-10-CM | POA: Insufficient documentation

## 2011-04-11 NOTE — Progress Notes (Signed)
Consult Note: Gyn-Onc  Courtney [name] 77 y.o. female  CC:  Chief Complaint  Patient presents with  . Gynecologic Exam    New CIS Vulva    HPI: A consult was requested by Dr. Juliene Pina for evaluation of vulvar carcinoma in situ. Courtney [name] is a 77 year old G1 P1 who noted the presence of a vulvar itching over the period of one year. She states that approximately 6 months ago she noted the presence of a mass on the right vulva. She states a portion of it fell off and that it's regrown in the interval period. She was evaluated by Dr. Mitzi Hansen and on 02/21/2011 a biopsy was obtained. 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 It is [name] believes that it has increased in size since the time of the biopsy she denies any bleeding.   Past GYN history Gravida 1 para 1 menarche at the age of 58 menopause in her early 23s denies any history of abnormal Pap tests. Last Pap test October 2012 that was within normal limits  Review of Systems:  Constitutional  Feels well significant weight loss since the onset of dialysis . Denies adenopathy Skin/Breast  No rash, sores, jaundice, itching,lumps, swelling, breast pain, or nipple discharge. Declines breast examination because of the size small size of her breasts. Cardiovascular  No chest pain, shortness of breath, or edema  Pulmonary  No cough or wheeze.  Gastro Intestinal  No nausea, vomitting, or diarrhoea. No bright red blood per rectum, no abdominal pain, change in bowel movement, or constipation.  Genito Urinary  No frequency, urgency, dysuria.  Right sided vulvar mass with associated pruritis, denies bleeding Musculo Skeletal  No myalgia, arthralgia, joint swelling or pain  Neurologic  No weakness, numbness, change in gait,  Psychology  No depression, anxiety, insomnia.    Current Meds:  Outpatient Encounter Prescriptions as of 04/11/2011  Medication Sig Dispense Refill  . B Complex-C-Folic Acid  (DIALYVITE 800 PO) Take 1 capsule by mouth daily.        . calcium acetate (PHOSLO) 667 MG capsule Take 667 mg by mouth 3 (three) times daily with meals.        . carvedilol (COREG) 12.5 MG tablet One tablet by mouth once daily       . clopidogrel (PLAVIX) 75 MG tablet Take 75 mg by mouth daily.        . fosinopril (MONOPRIL) 20 MG tablet One tablet by mouth once daily      . LUMIGAN 0.03 % ophthalmic solution Use as directed       . megestrol (MEGACE) 20 MG tablet Take 30 mg by mouth daily. One tablet by mouth once daily      . simvastatin (ZOCOR) 40 MG tablet One tablet by mouth once daily         Allergy:  Allergies  Allergen Reactions  . Aspirin     Social Hx:   History   Social History  . Marital Status: Single    Spouse Name: N/A    Number of Children: 1  . Years of Education: N/A   Occupational History  . Retired    Social History Main Topics  . Smoking status: Former Games developer  . Smokeless tobacco: Never Used  . Alcohol Use: No  . Drug Use: No  . Sexually Active: Not on file   Other Topics Concern  . Not on file   Social History Narrative  . No narrative on  file    Past Surgical Hx:  Past Surgical History  Procedure Date  . Av fistula placement   . Cholecystectomy     Past Medical Hx:  Past Medical History  Diagnosis Date  . HTN (hypertension)   . Anemia   . Hyperparathyroidism   . Glaucoma   . Hyperlipemia   . Kidney disease On dialysis for 3 years    Family Hx:  Family History  Problem Relation Age of Onset  . Colon cancer Neg Hx   . Heart disease Sister     Vitals:  Pulse 74, temperature 97.6 F (36.4 C), temperature source Oral, resp. rate 12, height 5' 2.01" (1.575 m), weight 86 lb 11.2 oz (39.327 kg).  Physical Exam: Neck  Supple without any enlargements.  Cardiovascular  Pulse normal rate, regularity and rhythm. S1 and S2 normal, Lungs  Clear to auscultation bilateraly, without wheezes/crackles/rhonchi. Good air movement.  Skin    No rash/lesions/breakdown.  Extremely dry skin  Psychiatry  Alert and oriented to person, place, and time  Abdomen  Soft with notable redundant tissue Normoactive bowel sounds, no evidence of hernia.  Genito Urinary  Vulva/vagina: External genitalia notable for a 2 cm non-fixed semi-pendulous mass on the inferior aspect of the right labia majora. The biopsy site just medial to the lesion is appreciated there is no friability     Bladder/urethra:  No lesions or masses  Cervix: Normal appearing, no lesions.  Uterus: Small, mobile, no parametrial involvement or nodularity.  Adnexa: No palpable masses. Rectal  Good tone, no masses no cul de sac nodularity. Internal and external hemorrhoids appreciated Extremities  No bilateral cyanosis, clubbing or edema. No rash, lesions or petiche.  Lymph node survey No cervical supraclavicular or inguinal adenopathy   Assessment/Plan:  This is a 77 year old with a 2 cm non-fixed vulvar mass with biopsy indicating carcinoma in situ. The patient and her niece who accompanies her are aware that this may be effaced process. The plan is for wide local excision of the mass on a nondialysis day likely Tuesday, December 11 04 the 18th. They are aware that if malignancy is identified in the final specimen a repeat excision of the surgical site with bilateral inguinal lymph node dissection may be indicated.  The risks and benefits of the procedure were discussed with the patient. She is aware that there is a 50% likelihood of wound breakdown. And that this is an outpatient procedure at this time she denies any medications for discomfort associated with the mass.   Laurette Schimke, MD 04/11/2011, 10:34 AM

## 2011-04-11 NOTE — Patient Instructions (Signed)
Wide local excision of the surgical mass. Courtney [name] will be contacted regarding the date of surgery

## 2011-04-17 ENCOUNTER — Encounter (HOSPITAL_COMMUNITY): Payer: Self-pay | Admitting: Pharmacy Technician

## 2011-04-18 ENCOUNTER — Encounter (HOSPITAL_COMMUNITY): Payer: Self-pay

## 2011-04-18 ENCOUNTER — Encounter (HOSPITAL_COMMUNITY)
Admission: RE | Admit: 2011-04-18 | Discharge: 2011-04-18 | Disposition: A | Discharge status: Home / Self Care | Payer: Medicare Other | Source: Ambulatory Visit | Attending: Gynecologic Oncology | Admitting: Gynecologic Oncology

## 2011-04-18 ENCOUNTER — Ambulatory Visit (HOSPITAL_COMMUNITY)
Admission: RE | Admit: 2011-04-18 | Discharge: 2011-04-18 | Disposition: A | Discharge status: Home / Self Care | Payer: Medicare Other | Source: Ambulatory Visit | Attending: Gynecologic Oncology | Admitting: Gynecologic Oncology

## 2011-04-18 DIAGNOSIS — Z01818 Encounter for other preprocedural examination: Secondary | ICD-10-CM | POA: Insufficient documentation

## 2011-04-18 DIAGNOSIS — J4489 Other specified chronic obstructive pulmonary disease: Secondary | ICD-10-CM | POA: Insufficient documentation

## 2011-04-18 DIAGNOSIS — Z01812 Encounter for preprocedural laboratory examination: Secondary | ICD-10-CM | POA: Insufficient documentation

## 2011-04-18 DIAGNOSIS — J449 Chronic obstructive pulmonary disease, unspecified: Secondary | ICD-10-CM | POA: Insufficient documentation

## 2011-04-18 HISTORY — DX: Other specified noninflammatory disorders of vulva and perineum: N90.89

## 2011-04-18 HISTORY — DX: Shortness of breath: R06.02

## 2011-04-18 HISTORY — DX: Peripheral vascular disease, unspecified: I73.9

## 2011-04-18 NOTE — Pre-Procedure Instructions (Signed)
PT 'S EKG AND CARDIOLOGY OFFICE NOTES 12/13/10 - WITH ECHOCARDIOGRAM RESULTS ON CHART  FROM DR. HARDING WITH [facility]. LAST NUCLEAR STRESS TEST WAS 2009--REPORT ON CHART. MORE RECENT EKG REPORT 02/10/11 FROM [facility]/ED ON THIS CHART WITH ER PHYSICIAN DOCUMENTATION SHEET. CXR WAS REPEATED TODAY PREOP 04/18/11 BECAUSE PT'S LAST CXR 02/10/11 AT [facility] WAS ABNORMAL. PT'S PREOP LABS TO BE DRAWN THE DAY OF SURGERY-PT IS ON DIALYSIS M W F PT WAS INSTRUCTED TO STOP HER PLAVIX PER ORDER DR. Nelly Rout.

## 2011-04-18 NOTE — Patient Instructions (Signed)
20 DENEE BOEDER  04/18/2011   Your procedure is scheduled on:  Tuesday 12 / 11 /12  AT 10:45 AM  Report to [facility] at 8:00 AM.  Call this number if you have problems the morning of surgery: [phone]   Remember:   Do not eat food OR DRINK ANYTHING AFTER MIDNIGHT THE NIGHT BEFORE YOUR SURGERY.    Take these medicines the morning of surgery with A SIP OF WATER: COREG   Do not wear jewelry, make-up or nail polish.  Do not wear lotions, powders, or perfumes. You may wear deodorant.  Do not shave 48 hours prior to surgery.  Do not bring valuables to the hospital.  Contacts, dentures or bridgework may not be worn into surgery.  Leave suitcase in the car. After surgery it may be brought to your room.  For patients admitted to the hospital, checkout time is 11:00 AM the day of discharge.   Patients discharged the day of surgery will not be allowed to drive home.    Special Instructions: CHG Shower Use Special Wash: 1/2 bottle night before surgery and 1/2 bottle morning of surgery.   Please read over the following fact sheets that you were given: MRSA Information, INCENTIVE SPIROMETER INSTRUCTIONS.  REMEMBER AFTER SURGERY TO DEEP BREATHE, COUGH AND MOVE ABOUT WHEN IN THE BED--MOVING LEGS UP AND DOWN-TURNING FREQUENTLY.

## 2011-04-23 ENCOUNTER — Encounter (HOSPITAL_COMMUNITY): Payer: Self-pay | Admitting: *Deleted

## 2011-04-23 ENCOUNTER — Ambulatory Visit (HOSPITAL_COMMUNITY): Payer: Medicare Other | Admitting: Certified Registered Nurse Anesthetist

## 2011-04-23 ENCOUNTER — Encounter (HOSPITAL_COMMUNITY): Payer: Self-pay

## 2011-04-23 ENCOUNTER — Encounter (HOSPITAL_COMMUNITY)
Admission: RE | Disposition: A | Discharge status: Home / Self Care | Payer: Self-pay | Source: Ambulatory Visit | Attending: Gynecologic Oncology

## 2011-04-23 ENCOUNTER — Encounter (HOSPITAL_COMMUNITY): Payer: Self-pay | Admitting: Certified Registered Nurse Anesthetist

## 2011-04-23 ENCOUNTER — Other Ambulatory Visit: Payer: Self-pay | Admitting: Gynecologic Oncology

## 2011-04-23 ENCOUNTER — Ambulatory Visit (HOSPITAL_COMMUNITY)
Admission: RE | Admit: 2011-04-23 | Discharge: 2011-04-23 | Disposition: A | Discharge status: Home / Self Care | Payer: Medicare Other | Source: Ambulatory Visit | Attending: Gynecologic Oncology | Admitting: Gynecologic Oncology

## 2011-04-23 DIAGNOSIS — D071 Carcinoma in situ of vulva: Secondary | ICD-10-CM | POA: Insufficient documentation

## 2011-04-23 DIAGNOSIS — L821 Other seborrheic keratosis: Secondary | ICD-10-CM | POA: Insufficient documentation

## 2011-04-23 DIAGNOSIS — H409 Unspecified glaucoma: Secondary | ICD-10-CM | POA: Insufficient documentation

## 2011-04-23 DIAGNOSIS — R599 Enlarged lymph nodes, unspecified: Secondary | ICD-10-CM | POA: Insufficient documentation

## 2011-04-23 DIAGNOSIS — N289 Disorder of kidney and ureter, unspecified: Secondary | ICD-10-CM | POA: Insufficient documentation

## 2011-04-23 DIAGNOSIS — I10 Essential (primary) hypertension: Secondary | ICD-10-CM | POA: Insufficient documentation

## 2011-04-23 DIAGNOSIS — K6289 Other specified diseases of anus and rectum: Secondary | ICD-10-CM | POA: Insufficient documentation

## 2011-04-23 DIAGNOSIS — E785 Hyperlipidemia, unspecified: Secondary | ICD-10-CM | POA: Insufficient documentation

## 2011-04-23 DIAGNOSIS — Z79899 Other long term (current) drug therapy: Secondary | ICD-10-CM | POA: Insufficient documentation

## 2011-04-23 DIAGNOSIS — E213 Hyperparathyroidism, unspecified: Secondary | ICD-10-CM | POA: Insufficient documentation

## 2011-04-23 HISTORY — PX: VULVAR LESION REMOVAL: SHX5391

## 2011-04-23 LAB — CBC
MCH: 28.3 pg (ref 26.0–34.0)
MCHC: 33.4 g/dL (ref 30.0–36.0)
MCV: 84.8 fL (ref 78.0–100.0)
Platelets: 116 10*3/uL — ABNORMAL LOW (ref 150–400)

## 2011-04-23 LAB — BASIC METABOLIC PANEL
BUN: 37 mg/dL — ABNORMAL HIGH (ref 6–23)
CO2: 27 mEq/L (ref 19–32)
Calcium: 9.5 mg/dL (ref 8.4–10.5)
GFR calc non Af Amer: 5 mL/min — ABNORMAL LOW (ref >90)
Glucose, Bld: 83 mg/dL (ref 70–99)

## 2011-04-23 LAB — DIFFERENTIAL
Basophils Relative: 1 % (ref 0–1)
Eosinophils Absolute: 0.4 10*3/uL (ref 0.0–0.7)
Eosinophils Relative: 8 % — ABNORMAL HIGH (ref 0–5)
Monocytes Relative: 12 % (ref 3–12)
Neutrophils Relative %: 55 % (ref 43–77)

## 2011-04-23 SURGERY — VULVAR LESION
Anesthesia: General | Site: Vagina | Wound class: Clean Contaminated

## 2011-04-23 MED ORDER — ONDANSETRON HCL 4 MG/2ML IJ SOLN
INTRAMUSCULAR | Status: DC | PRN
  Administered 2011-04-23: 4 mg via INTRAVENOUS

## 2011-04-23 MED ORDER — LIDOCAINE HCL (CARDIAC) 20 MG/ML IV SOLN
INTRAVENOUS | Status: DC | PRN
  Administered 2011-04-23: 40 mg via INTRAVENOUS

## 2011-04-23 MED ORDER — LACTATED RINGERS IV SOLN: INTRAVENOUS | Status: DC

## 2011-04-23 MED ORDER — FENTANYL CITRATE 0.05 MG/ML IJ SOLN
INTRAMUSCULAR | Status: DC | PRN
  Administered 2011-04-23 (×3): 25 ug via INTRAVENOUS

## 2011-04-23 MED ORDER — CEFAZOLIN SODIUM 1-5 GM-% IV SOLN
INTRAVENOUS | Status: DC | PRN
  Administered 2011-04-23: 1 g via INTRAVENOUS

## 2011-04-23 MED ORDER — FENTANYL CITRATE 0.05 MG/ML IJ SOLN
25.0000 ug | INTRAMUSCULAR | Status: DC | PRN
  Administered 2011-04-23: 25 ug via INTRAVENOUS

## 2011-04-23 MED ORDER — SODIUM CHLORIDE 0.9 % IV SOLN
INTRAVENOUS | Status: DC
  Administered 2011-04-23: 1000 mL via INTRAVENOUS

## 2011-04-23 MED ORDER — CEFAZOLIN SODIUM 1-5 GM-% IV SOLN
INTRAVENOUS | Status: AC
  Filled 2011-04-23: qty 50

## 2011-04-23 MED ORDER — FENTANYL CITRATE 0.05 MG/ML IJ SOLN
INTRAMUSCULAR | Status: AC
  Filled 2011-04-23: qty 2

## 2011-04-23 MED ORDER — TRAMADOL HCL 50 MG PO TABS: 50.0000 mg | ORAL_TABLET | Freq: Four times a day (QID) | ORAL | Status: DC | PRN

## 2011-04-23 MED ORDER — PROMETHAZINE HCL 25 MG/ML IJ SOLN: 6.2500 mg | INTRAMUSCULAR | Status: DC | PRN

## 2011-04-23 MED ORDER — SODIUM CHLORIDE 0.9 % IV SOLN
INTRAVENOUS | Status: DC | PRN
  Administered 2011-04-23: 10:00:00 via INTRAVENOUS

## 2011-04-23 MED ORDER — ACETIC ACID 5 % SOLN
Status: DC | PRN
  Administered 2011-04-23: 1 via TOPICAL

## 2011-04-23 MED ORDER — ETOMIDATE 2 MG/ML IV SOLN
INTRAVENOUS | Status: DC | PRN
  Administered 2011-04-23: 12 mg via INTRAVENOUS

## 2011-04-23 MED ORDER — ACETIC ACID 5 % SOLN
Status: AC
  Filled 2011-04-23: qty 500

## 2011-04-23 SURGICAL SUPPLY — 29 items
APPLICATOR COTTON TIP 6IN STRL (MISCELLANEOUS) ×4 IMPLANT
BLADE SURG 15 STRL LF DISP TIS (BLADE) ×1 IMPLANT
BLADE SURG 15 STRL SS (BLADE) ×2
BLADE SURG SZ11 CARB STEEL (BLADE) IMPLANT
CANISTER SUCTION 2500CC (MISCELLANEOUS) ×2 IMPLANT
CATH ROBINSON RED A/P 16FR (CATHETERS) ×1 IMPLANT
CLOTH BEACON ORANGE TIMEOUT ST (SAFETY) ×2 IMPLANT
COVER SURGICAL LIGHT HANDLE (MISCELLANEOUS) ×3 IMPLANT
DRESSING TELFA 8X3 (GAUZE/BANDAGES/DRESSINGS) ×2 IMPLANT
ELECT NDL TIP 2.8 STRL (NEEDLE) ×1 IMPLANT
ELECT NEEDLE TIP 2.8 STRL (NEEDLE) ×2 IMPLANT
ELECT REM PT RETURN 9FT ADLT (ELECTROSURGICAL) ×2
ELECTRODE REM PT RTRN 9FT ADLT (ELECTROSURGICAL) ×1 IMPLANT
GLOVE BIO SURGEON STRL SZ7.5 (GLOVE) ×6 IMPLANT
GLOVE INDICATOR 8.0 STRL GRN (GLOVE) ×2 IMPLANT
NEEDLE HYPO 22GX1.5 SAFETY (NEEDLE) ×2 IMPLANT
NS IRRIG 1000ML POUR BTL (IV SOLUTION) ×2 IMPLANT
PACK ICE MAXI GEL EZY WRAP (MISCELLANEOUS) ×12 IMPLANT
PACK MINOR VAGINAL W LONG (CUSTOM PROCEDURE TRAY) ×2 IMPLANT
PENCIL BUTTON HOLSTER BLD 10FT (ELECTRODE) ×2 IMPLANT
SPONGE LAP 18X18 X RAY DECT (DISPOSABLE) ×2 IMPLANT
SUT MNCRL AB 4-0 PS2 18 (SUTURE) ×1 IMPLANT
SUT VIC AB 2-0 CT2 27 (SUTURE) ×4 IMPLANT
SUT VIC AB 2-0 SH 27 (SUTURE)
SUT VIC AB 2-0 SH 27X BRD (SUTURE) ×4 IMPLANT
SUT VIC AB 3-0 SH 27 (SUTURE) ×4
SUT VIC AB 3-0 SH 27X BRD (SUTURE) IMPLANT
SYR CONTROL 10ML LL (SYRINGE) ×2 IMPLANT
UNDERPAD 30X30 INCONTINENT (UNDERPADS AND DIAPERS) ×2 IMPLANT

## 2011-04-23 NOTE — Op Note (Signed)
Preoperative Diagnosis: Vulvar carcinoma in situ  Postoperative Diagnosis: Vulvar carcinoma in situ  Procedure(s) Performed: 5 cm x 4 cm excision of inferior right labia majora     1 x 1 cm right perianal excision  Surgeon: Maryclare Labrador.  Nelly Rout, M.D. PhD   Assistant: Telford Nab RN, MSN  Specimens: Right inferior labia majora excision, right perianal excision  Estimated Blood Loss: Minimal mL. Blood Replacement: None   Urine Output: None  Fluid: 75 cc  Complications: None.   Indication for Procedure: This is a 77 year old who notes the recent presence of a mass of the right labia majora. 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 3 l right perianal lesions approximately 3 mm each   Procedure: Patient was taken to operating room and placed under general endotracheal anesthesia without any difficulty. She was placed in the dorsal lithotomy position and prepped and draped in usual sterile fashion. With the surgical pen a 1 cm border was drawn in elliptical fashion around the right labia majora lesion and the right perianal lesions. The skin was excised with a scalpel and the Bovie used to level of the subcutaneous plane. This was excised and mounted onto cork and sent to pathology. The excision site was irrigated and hemostasis was achieved. The lesion was closed in 2 layers the first with interrupted 2-0 Vicryl sutures. The skin was closed with a running 4-0 Monocryl subcuticular closure. The right lesions were excised to the level subcutaneous tissue. 2 interrupted sutures were used to close the defect. The entire area was irrigated and hemostasis was assured.  Sponge, lap and needle counts were correct x 3.    The patient had sequential compression devices for VTE prophylaxis.         Disposition: PACU - hemodynamically stable.         Condition: stable

## 2011-04-23 NOTE — Anesthesia Preprocedure Evaluation (Signed)
Anesthesia Evaluation  Patient identified by MRN, date of birth, ID band Patient awake  General Assessment Comment:Increased CV risk  Reviewed: Allergy & Precautions, H&P , NPO status , Patient's Chart, lab work & pertinent test results, reviewed documented beta blocker date and time   Airway Mallampati: II TM Distance: >3 FB Neck ROM: Full    Dental  (+) Upper Dentures and Lower Dentures   Pulmonary neg pulmonary ROS,  clear to auscultation        Cardiovascular hypertension, Pt. on medications + CAD and + Peripheral Vascular Disease Regular Normal+ Systolic murmurs S/p CABG ,currently asymptomatic. Carotid artery Dz-rt Stents to femoral artery-rt   Neuro/Psych Negative Neurological ROS  Negative Psych ROS   GI/Hepatic negative GI ROS, Neg liver ROS,   Endo/Other  Negative Endocrine ROS  Renal/GU CRFRenal diseaseDialysis dependent-MWF   Vulvar lesion     Musculoskeletal negative musculoskeletal ROS (+)   Abdominal   Peds negative pediatric ROS (+)  Hematology Anemia, Hgb 10.8   Anesthesia Other Findings   Reproductive/Obstetrics negative OB ROS                           Anesthesia Physical Anesthesia Plan  ASA: III  Anesthesia Plan: General   Post-op Pain Management:    Induction: Intravenous  Airway Management Planned: LMA  Additional Equipment:   Intra-op Plan:   Post-operative Plan: Extubation in OR  Informed Consent: I have reviewed the patients History and Physical, chart, labs and discussed the procedure including the risks, benefits and alternatives for the proposed anesthesia with the patient or authorized representative who has indicated his/her understanding and acceptance.     Plan Discussed with: CRNA and Surgeon  Anesthesia Plan Comments:         Anesthesia Quick Evaluation

## 2011-04-23 NOTE — Anesthesia Procedure Notes (Signed)
Date/Time: 04/23/2011 10:22 AM Performed by: [place], Pierce Biagini C Pre-anesthesia Checklist: Patient identified, Emergency Drugs available, Suction available, Patient being monitored and Timeout performed Patient Re-evaluated:Patient Re-evaluated prior to inductionOxygen Delivery Method: Circle System Utilized Preoxygenation: Pre-oxygenation with 100% oxygen Intubation Type: IV induction LMA: LMA inserted LMA Size: 4.0 Number of attempts: 1 Tube secured with: Tape Dental Injury: Teeth and Oropharynx as per pre-operative assessment

## 2011-04-23 NOTE — Progress Notes (Signed)
Iced peri pads and peri bottle given to pt for home use. Instructed to cleanse area well after voiding and bowel movements.

## 2011-04-23 NOTE — Interval H&P Note (Signed)
History and Physical Interval Note:  04/23/2011 10:06 AM  Courtney [name]  has presented today for surgery, with the diagnosis of vulvar lesion   The various methods of treatment have been discussed with the patient and family. After consideration of risks, benefits and other options for treatment, the patient has consented to  Procedure(s): EXCISION OF VULVAR LESION as a surgical intervention .  The patients' history has been reviewed, patient examined, no change in status, stable for surgery.  I have reviewed the patients' chart and labs.  Questions were answered to the patient's satisfaction.     [name]

## 2011-04-23 NOTE — Anesthesia Postprocedure Evaluation (Signed)
  Anesthesia Post-op Note  Patient: Courtney [name]  Procedure(s) Performed:  VULVAR LESION - Wide Local Excision Of Vulvar Lesion   Patient Location: PACU  Anesthesia Type: General  Level of Consciousness: oriented and sedated  Airway and Oxygen Therapy: Patient Spontanous Breathing and Patient connected to nasal cannula oxygen  Post-op Pain: mild  Post-op Assessment: Post-op Vital signs reviewed, Patient's Cardiovascular Status Stable, Respiratory Function Stable and Patent Airway  Post-op Vital Signs: stable  Complications: No apparent anesthesia complications

## 2011-04-23 NOTE — H&P (View-Only) (Signed)
Consult Note: Gyn-Onc  Courtney [name] 77 y.o. female  CC:  Chief Complaint  Patient presents with  . Gynecologic Exam    New CIS Vulva    HPI: A consult was requested by Dr. Mody for evaluation of vulvar carcinoma in situ. Courtney [name] is a 77-year-old G1 P1 who noted the presence of a vulvar itching over the period of one year. She states that approximately 6 months ago she noted the presence of a mass on the right vulva. She states a portion of it fell off and that it's regrown in the interval period. She was evaluated by Dr. Moody and on 02/21/2011 a biopsy was obtained. 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 It is Courtney [name] believes that it has increased in size since the time of the biopsy she denies any bleeding.   Past GYN history Gravida 1 para 1 menarche at the age of 18 menopause in her early 50s denies any history of abnormal Pap tests. Last Pap test October 2012 that was within normal limits  Review of Systems:  Constitutional  Feels well significant weight loss since the onset of dialysis . Denies adenopathy Skin/Breast  No rash, sores, jaundice, itching,lumps, swelling, breast pain, or nipple discharge. Declines breast examination because of the size small size of her breasts. Cardiovascular  No chest pain, shortness of breath, or edema  Pulmonary  No cough or wheeze.  Gastro Intestinal  No nausea, vomitting, or diarrhoea. No bright red blood per rectum, no abdominal pain, change in bowel movement, or constipation.  Genito Urinary  No frequency, urgency, dysuria.  Right sided vulvar mass with associated pruritis, denies bleeding Musculo Skeletal  No myalgia, arthralgia, joint swelling or pain  Neurologic  No weakness, numbness, change in gait,  Psychology  No depression, anxiety, insomnia.    Current Meds:  Outpatient Encounter Prescriptions as of 04/11/2011  Medication Sig Dispense Refill  . B Complex-C-Folic Acid  (DIALYVITE 800 PO) Take 1 capsule by mouth daily.        . calcium acetate (PHOSLO) 667 MG capsule Take 667 mg by mouth 3 (three) times daily with meals.        . carvedilol (COREG) 12.5 MG tablet One tablet by mouth once daily       . clopidogrel (PLAVIX) 75 MG tablet Take 75 mg by mouth daily.        . fosinopril (MONOPRIL) 20 MG tablet One tablet by mouth once daily      . LUMIGAN 0.03 % ophthalmic solution Use as directed       . megestrol (MEGACE) 20 MG tablet Take 30 mg by mouth daily. One tablet by mouth once daily      . simvastatin (ZOCOR) 40 MG tablet One tablet by mouth once daily         Allergy:  Allergies  Allergen Reactions  . Aspirin     Social Hx:   History   Social History  . Marital Status: Single    Spouse Name: N/A    Number of Children: 1  . Years of Education: N/A   Occupational History  . Retired    Social History Main Topics  . Smoking status: Former Smoker  . Smokeless tobacco: Never Used  . Alcohol Use: No  . Drug Use: No  . Sexually Active: Not on file   Other Topics Concern  . Not on file   Social History Narrative  . No narrative on   file    Past Surgical Hx:  Past Surgical History  Procedure Date  . Av fistula placement   . Cholecystectomy     Past Medical Hx:  Past Medical History  Diagnosis Date  . HTN (hypertension)   . Anemia   . Hyperparathyroidism   . Glaucoma   . Hyperlipemia   . Kidney disease On dialysis for 3 years    Family Hx:  Family History  Problem Relation Age of Onset  . Colon cancer Neg Hx   . Heart disease Sister     Vitals:  Pulse 74, temperature 97.6 F (36.4 C), temperature source Oral, resp. rate 12, height 5' 2.01" (1.575 m), weight 86 lb 11.2 oz (39.327 kg).  Physical Exam: Neck  Supple without any enlargements.  Cardiovascular  Pulse normal rate, regularity and rhythm. S1 and S2 normal, Lungs  Clear to auscultation bilateraly, without wheezes/crackles/rhonchi. Good air movement.  Skin    No rash/lesions/breakdown.  Extremely dry skin  Psychiatry  Alert and oriented to person, place, and time  Abdomen  Soft with notable redundant tissue Normoactive bowel sounds, no evidence of hernia.  Genito Urinary  Vulva/vagina: External genitalia notable for a 2 cm non-fixed semi-pendulous mass on the inferior aspect of the right labia majora. The biopsy site just medial to the lesion is appreciated there is no friability     Bladder/urethra:  No lesions or masses  Cervix: Normal appearing, no lesions.  Uterus: Small, mobile, no parametrial involvement or nodularity.  Adnexa: No palpable masses. Rectal  Good tone, no masses no cul de sac nodularity. Internal and external hemorrhoids appreciated Extremities  No bilateral cyanosis, clubbing or edema. No rash, lesions or petiche.  Lymph node survey No cervical supraclavicular or inguinal adenopathy   Assessment/Plan:  This is a 77-year-old with a 2 cm non-fixed vulvar mass with biopsy indicating carcinoma in situ. The patient and her niece who accompanies her are aware that this may be effaced process. The plan is for wide local excision of the mass on a nondialysis day likely Tuesday, December 11 04 the 18th. They are aware that if malignancy is identified in the final specimen a repeat excision of the surgical site with bilateral inguinal lymph node dissection may be indicated.  The risks and benefits of the procedure were discussed with the patient. She is aware that there is a 50% likelihood of wound breakdown. And that this is an outpatient procedure at this time she denies any medications for discomfort associated with the mass.   Courtney Fertig, MD 04/11/2011, 10:34 AM   

## 2011-04-23 NOTE — Transfer of Care (Signed)
Immediate Anesthesia Transfer of Care Note  Patient: Courtney [name]  Procedure(s) Performed:  VULVAR LESION - Wide Local Excision Of Vulvar Lesion   Patient Location: PACU  Anesthesia Type: General  Level of Consciousness: awake and alert   Airway & Oxygen Therapy: Patient Spontanous Breathing and Patient connected to face mask oxygen  Post-op Assessment: Report given to PACU RN and Post -op Vital signs reviewed and stable  Post vital signs: Reviewed and stable  Complications: No apparent anesthesia complications

## 2011-04-25 ENCOUNTER — Encounter (HOSPITAL_COMMUNITY): Payer: Self-pay | Admitting: Gynecologic Oncology

## 2011-05-21 ENCOUNTER — Encounter: Payer: Self-pay | Admitting: Gynecologic Oncology

## 2011-05-22 ENCOUNTER — Encounter: Payer: Self-pay | Admitting: Gynecologic Oncology

## 2011-05-22 ENCOUNTER — Ambulatory Visit: Payer: Medicare Other | Attending: Gynecologic Oncology | Admitting: Gynecologic Oncology

## 2011-05-22 VITALS — HR 60 | Resp 14 | Ht 64.45 in | Wt 86.6 lb

## 2011-05-22 DIAGNOSIS — Z951 Presence of aortocoronary bypass graft: Secondary | ICD-10-CM | POA: Insufficient documentation

## 2011-05-22 DIAGNOSIS — L821 Other seborrheic keratosis: Secondary | ICD-10-CM | POA: Insufficient documentation

## 2011-05-22 DIAGNOSIS — D071 Carcinoma in situ of vulva: Secondary | ICD-10-CM | POA: Insufficient documentation

## 2011-05-22 DIAGNOSIS — E785 Hyperlipidemia, unspecified: Secondary | ICD-10-CM | POA: Insufficient documentation

## 2011-05-22 DIAGNOSIS — I6529 Occlusion and stenosis of unspecified carotid artery: Secondary | ICD-10-CM | POA: Insufficient documentation

## 2011-05-22 DIAGNOSIS — N189 Chronic kidney disease, unspecified: Secondary | ICD-10-CM | POA: Insufficient documentation

## 2011-05-22 DIAGNOSIS — I2510 Atherosclerotic heart disease of native coronary artery without angina pectoris: Secondary | ICD-10-CM | POA: Insufficient documentation

## 2011-05-22 DIAGNOSIS — Z79899 Other long term (current) drug therapy: Secondary | ICD-10-CM | POA: Insufficient documentation

## 2011-05-22 DIAGNOSIS — I129 Hypertensive chronic kidney disease with stage 1 through stage 4 chronic kidney disease, or unspecified chronic kidney disease: Secondary | ICD-10-CM | POA: Insufficient documentation

## 2011-05-22 NOTE — Patient Instructions (Signed)
Followup with GYN oncology in 6 months. Followup with Dr. Mitzi Hansen in 12 months for subsequent gynecologic care

## 2011-05-22 NOTE — Progress Notes (Signed)
Follow-up Note: Gyn-Onc   Courtney [name] 78 y.o. female  CC:  Chief Complaint  Patient presents with  . VIN III    Follow up    HPI:Courtney [name] is a 78 year old G1 P1 who noted the presence of a vulvar itching over the period of one year. She states that approximately 6 months ago she noted the presence of a mass on the right vulva. She states a portion of it fell off and that it's regrown in the interval period. She was evaluated by Dr. Mitzi Hansen and on 02/21/2011 a biopsy was obtained. 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   Interval History:  04/23/2011 she underwent wide local excision of the site of vulvar lesion an additional lesion near the buttocks. Pathology demonstrated1. Vulva, excision, lesion - SEVERE SQUAMOUS DYSPLASIA, VIN-III. MARGINS NOT INVOLVED. 2. Skin , right buttocks lesion - SEBORRHEIC KERATOSIS. NO EVIDENCE OF MALIGNANCY.     Current Meds:  Outpatient Encounter Prescriptions as of 05/22/2011  Medication Sig Dispense Refill  . B Complex-C-Folic Acid (DIALYVITE 800 PO) Take 1 capsule by mouth daily.       . calcium acetate (PHOSLO) 667 MG capsule Take 667 mg by mouth 3 (three) times daily with meals.       . carvedilol (COREG) 12.5 MG tablet Take 12.5 mg by mouth 2 (two) times daily with a meal. One tablet by mouth once daily      . clopidogrel (PLAVIX) 75 MG tablet Take 75 mg by mouth every morning.       . fosinopril (MONOPRIL) 20 MG tablet Take 20 mg by mouth at bedtime. One tablet by mouth once daily      . LUMIGAN 0.03 % ophthalmic solution Place 1 drop into both eyes at bedtime. Use as directed      . megestrol (MEGACE) 20 MG tablet Take 30 mg by mouth daily. One tablet by mouth once daily      . simvastatin (ZOCOR) 40 MG tablet Take 40 mg by mouth at bedtime. One tablet by mouth once daily      . traMADol (ULTRAM) 50 MG tablet Take 1 tablet (50 mg total) by mouth every 6 (six) hours as needed for pain. Maximum dose= 8  tablets per day  20 tablet  0    Allergy:  Allergies  Allergen Reactions  . Aspirin Itching  . Pletal (Cilostazol)     dizziness    Social Hx:   History   Social History  . Marital Status: Single    Spouse Name: N/A    Number of Children: 1  . Years of Education: N/A   Occupational History  . Retired    Social History Main Topics  . Smoking status: Former Games developer  . Smokeless tobacco: Never Used  . Alcohol Use: No  . Drug Use: No  . Sexually Active: No   Other Topics Concern  . Not on file   Social History Narrative  . No narrative on file    Past Surgical Hx:  Past Surgical History  Procedure Date  . Av fistula placement   . Cholecystectomy   . Coronary artery bypass graft 1997  . Vulvar lesion removal 04/23/2011    Procedure: VULVAR LESION;  Surgeon: Laurette Schimke, MD;  Location: [facility];  Service: Gynecology;  Laterality: N/A;  Wide Local Excision Of Vulvar Lesion     Past Medical Hx:  Past Medical History  Diagnosis Date  . HTN (hypertension)   .  Hyperparathyroidism   . Glaucoma   . Hyperlipemia   . Coronary artery disease   . Peripheral vascular disease     CAROTID ARTERY STENOSIS   . Anemia     DUE TO CHRONIC RENAL INSUFFIENCY  . 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 Vulvar lesion   . Shortness of breath     RELATED TO DIALYSIS -TOO MUCH FLUID    Family Hx:  Family History  Problem Relation Age of Onset  . Colon cancer Neg Hx   . Heart disease Sister     Review of Systems:  Constitutional  Feels well Genito Urinary  No frequency, urgency, dysuria, no pruritus bleeding or discomfort in the perineal area   Vitals:  Pulse 60, resp. rate 14, height 5' 4.45" (1.637 m), weight 86 lb 9.6 oz (39.282 kg).  Physical  Exam: WD female in no acute distress in very good spirits Genito Urinary  Vulva/vagina: Normal external female genitalia.  No lesions. Surgical site healing very well no erythema tenderness  Extremities  No bilateral cyanosis, clubbing or edema.    Assessment/Plan:  This is a 78 y.o. year old vessels wide local excision of a VIN-3 lesion. She was counseled that routine gynecologic evaluation will be necessary. Facet follow up with GYN oncology in 6 months and if that exam is unremarkable she will continue evaluation under Dr. Camillia Herter care   Laurette Schimke, MD., PhD. 05/22/2011, 12:37 PM

## 2011-07-10 ENCOUNTER — Encounter: Payer: Self-pay | Admitting: Vascular Surgery

## 2011-07-11 ENCOUNTER — Encounter: Payer: Self-pay | Admitting: Vascular Surgery

## 2011-07-11 ENCOUNTER — Ambulatory Visit (INDEPENDENT_AMBULATORY_CARE_PROVIDER_SITE_OTHER): Payer: Medicare Other | Admitting: Vascular Surgery

## 2011-07-11 VITALS — BP 147/61 | HR 72 | Resp 16 | Ht 62.0 in | Wt 88.0 lb

## 2011-07-11 DIAGNOSIS — N186 End stage renal disease: Secondary | ICD-10-CM

## 2011-07-11 DIAGNOSIS — T82898A Other specified complication of vascular prosthetic devices, implants and grafts, initial encounter: Secondary | ICD-10-CM | POA: Insufficient documentation

## 2011-07-11 DIAGNOSIS — M79606 Pain in leg, unspecified: Secondary | ICD-10-CM

## 2011-07-11 DIAGNOSIS — M79609 Pain in unspecified limb: Secondary | ICD-10-CM

## 2011-07-11 NOTE — Progress Notes (Addendum)
[facility] [place] HISTORY AND PHYSICAL   History of Present Illness:  Patient is a 78 y.o. year old female who presents for evaluation of 2 problems.  First, the patient has aneurysmal right brachiocephalic AV fistula. She had a left brachiocephalic AV fistula placed with the plan to ligate her right arm fistula. However the aneurysm has not degenerated further. She has had no problems from this. Secondly, she complains of left lower extremity claudication. She has a previous history of PAD. She previously had a right superficial femoral artery stent placed by Dr. Nadara Eaton several years ago. The patient currently denies rest pain. However she develops a cramping sensation in her left calf after walking approximately one block. She denies rest pain. She has had no previous ulcers on the left foot. She is a former smoker she denies any right leg symptoms.  Other medical problems include hypertension, hyperlipidemia, coronary disease, fluid overload secondary to renal failure, end-stage renal disease requiring dialysis Monday Wednesday and Friday.  These medical problems are currently stable.  She does take Plavix for antiplatelet therapy.  Past Medical History  Diagnosis Date  . HTN (hypertension)   . Hyperparathyroidism   . Glaucoma   . Hyperlipemia   . Coronary artery disease   . Peripheral vascular disease     CAROTID ARTERY STENOSIS   . Anemia     DUE TO CHRONIC RENAL INSUFFIENCY  . 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 Vulvar lesion   . Shortness of breath     RELATED TO DIALYSIS -TOO MUCH FLUID    Past Surgical History  Procedure Date  . Av fistula placement   . Cholecystectomy   . Coronary artery bypass graft 1997  . Vulvar lesion removal  04/23/2011    Procedure: VULVAR LESION;  Surgeon: Laurette Schimke, MD;  Location: [facility];  Service: Gynecology;  Laterality: N/A;  Wide Local Excision Of Vulvar Lesion      Social History History  Substance Use Topics  . Smoking status: Former Games developer  . Smokeless tobacco: Never Used  . Alcohol Use: No    Family History Family History  Problem Relation Age of Onset  . Colon cancer Neg Hx   . Heart disease Sister     Allergies  Allergies  Allergen Reactions  . Aspirin Itching  . Pletal (Cilostazol)     dizziness     Current Outpatient Prescriptions  Medication Sig Dispense Refill  . B Complex-C-Folic Acid (DIALYVITE 800 PO) Take 1 capsule by mouth daily.       . calcium acetate (PHOSLO) 667 MG capsule Take 667 mg by mouth 3 (three) times daily with meals.       . carvedilol (COREG) 12.5 MG tablet Take 12.5 mg by mouth 2 (two) times daily with a meal. One tablet by mouth once daily      . clopidogrel (PLAVIX) 75 MG tablet Take 75 mg by mouth every morning.       . fosinopril (MONOPRIL) 20 MG tablet Take 20 mg by mouth at bedtime. One tablet by mouth once daily      . LUMIGAN 0.03 % ophthalmic solution Place 1 drop into both eyes at bedtime. Use as directed      .  megestrol (MEGACE) 20 MG tablet Take 30 mg by mouth daily. One tablet by mouth once daily      . simvastatin (ZOCOR) 40 MG tablet Take 40 mg by mouth at bedtime. One tablet by mouth once daily      . traMADol (ULTRAM) 50 MG tablet Take 1 tablet (50 mg total) by mouth every 6 (six) hours as needed for pain. Maximum dose= 8 tablets per day  20 tablet  0    ROS:   General:  No weight loss, Fever, chills  HEENT: No recent headaches, no nasal bleeding, no visual changes, no sore throat  Neurologic: No dizziness, blackouts, seizures. No recent symptoms of stroke or mini- stroke. No recent episodes of slurred speech, or temporary blindness.  Cardiac: No recent episodes of chest pain/pressure, no shortness of breath at  rest.  No shortness of breath with exertion.  Denies history of atrial fibrillation or irregular heartbeat  Vascular: No history of rest pain in feet.  + history of claudication.  No history of non-healing ulcer, No history of DVT   Pulmonary: No home oxygen, no productive cough, no hemoptysis,  No asthma or wheezing  Musculoskeletal:  [ ]  Arthritis, [ ]  Low back pain,  [ ]  Joint pain  Hematologic:No history of hypercoagulable state.  No history of easy bleeding.  No history of anemia  Gastrointestinal: No hematochezia or melena,  No gastroesophageal reflux, no trouble swallowing  Urinary: [x ] chronic Kidney disease, [ ]  on HD - [x ] MWF or [ ]  TTHS, [ ]  Burning with urination, [ ]  Frequent urination, [ ]  Difficulty urinating;   Skin: No rashes  Psychological: No history of anxiety,  No history of depression   Physical Examination  Filed Vitals:   07/11/11 1501  BP: 147/61  Pulse: 72  Resp: 16  Height: 5\' 2"  (1.575 m)  Weight: 88 lb (39.917 kg)  SpO2: 97%    Body mass index is 16.10 kg/(m^2).  General:  Alert and oriented, no acute distress HEENT: Normal Neck: No bruit or JVD Pulmonary: Clear to auscultation bilaterally Cardiac: Regular Rate and Rhythm without murmur Abdomen: Soft, non-tender, non-distended, no mass Skin: No rash Extremity Pulses:  2+ radial bilateral, bilateral upper arm fistulas left side has palpable thrill and is easily palpable throughout its course. Right side is aneurysmally degenerated but no skin thinning no ulceration, 2+ femoral right side , one plus femoral left side absent dorsalis pedis, posterior tibial pulses bilaterally, left femoral artery has palpable calcification Musculoskeletal: No deformity or edema  Neurologic: Upper and lower extremity motor 5/5 and symmetric  DATA: ABIs and duplex scan from [facility] heart dated 06/18/2011 were reviewed. ABIs were not performed due to calcification. The patient had a patent right superficial  femoral artery stent with one-vessel runoff via the peroneal on the right side. On the left side she had monophasic waveforms in the external iliac and common femoral artery suggesting inflow stenosis. She also had a suggestion of a superficial femoral artery stenosis in the mid thigh. There appeared to be two-vessel runoff to the left foot.   ASSESSMENT: Claudication left lower extremity most likely external iliac common femoral and superficial femoral disease with mild tibial disease.  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   PLAN:  Aortogram with bilateral lower extremity runoff possible intervention. To avoid her dialysis have scheduled this for my partner Dr. Imogene Burn on a Thursday. If she requires operative intervention I will schedule that for her after  evaluation for a cardiac standpoint. However if Dr. Imogene Burn is able to do a percutaneous procedure to improve her flow we'll do that at the time of arteriogram. Risks benefits possible complications and procedure details were explained the patient today including but limited to bleeding infection vessel injury contrast reaction she understands and agrees to proceed.  As far as her right upper arm AV fistula aneurysm is concerned, as long as this does not continue to expand or have thin skin or ulceration we will continue to observe this for now.  Fabienne Bruns, MD [facility] [place] Office: [phone] Pager: [phone]

## 2011-07-12 ENCOUNTER — Other Ambulatory Visit: Payer: Self-pay

## 2011-07-17 ENCOUNTER — Encounter (HOSPITAL_COMMUNITY): Payer: Self-pay | Admitting: Pharmacy Technician

## 2011-07-17 MED ORDER — SODIUM CHLORIDE 0.9 % IJ SOLN: 3.0000 mL | INTRAMUSCULAR | Status: DC | PRN

## 2011-07-18 ENCOUNTER — Ambulatory Visit (HOSPITAL_COMMUNITY)
Admission: RE | Admit: 2011-07-18 | Discharge: 2011-07-18 | Disposition: A | Discharge status: Home / Self Care | Payer: Medicare Other | Source: Ambulatory Visit | Attending: Vascular Surgery | Admitting: Vascular Surgery

## 2011-07-18 ENCOUNTER — Encounter (HOSPITAL_COMMUNITY)
Admission: RE | Disposition: A | Discharge status: Home / Self Care | Payer: Self-pay | Source: Ambulatory Visit | Attending: Vascular Surgery

## 2011-07-18 DIAGNOSIS — I70219 Atherosclerosis of native arteries of extremities with intermittent claudication, unspecified extremity: Secondary | ICD-10-CM

## 2011-07-18 DIAGNOSIS — D631 Anemia in chronic kidney disease: Secondary | ICD-10-CM | POA: Insufficient documentation

## 2011-07-18 DIAGNOSIS — E785 Hyperlipidemia, unspecified: Secondary | ICD-10-CM | POA: Insufficient documentation

## 2011-07-18 DIAGNOSIS — I129 Hypertensive chronic kidney disease with stage 1 through stage 4 chronic kidney disease, or unspecified chronic kidney disease: Secondary | ICD-10-CM | POA: Insufficient documentation

## 2011-07-18 DIAGNOSIS — N039 Chronic nephritic syndrome with unspecified morphologic changes: Secondary | ICD-10-CM | POA: Insufficient documentation

## 2011-07-18 DIAGNOSIS — Z992 Dependence on renal dialysis: Secondary | ICD-10-CM | POA: Insufficient documentation

## 2011-07-18 DIAGNOSIS — I2510 Atherosclerotic heart disease of native coronary artery without angina pectoris: Secondary | ICD-10-CM | POA: Insufficient documentation

## 2011-07-18 DIAGNOSIS — N189 Chronic kidney disease, unspecified: Secondary | ICD-10-CM | POA: Insufficient documentation

## 2011-07-18 HISTORY — PX: ABDOMINAL AORTAGRAM: SHX5454

## 2011-07-18 LAB — POCT I-STAT, CHEM 8
Calcium, Ion: 1.23 mmol/L (ref 1.12–1.32)
Chloride: 104 mEq/L (ref 96–112)
HCT: 35 % — ABNORMAL LOW (ref 36.0–46.0)
Hemoglobin: 11.9 g/dL — ABNORMAL LOW (ref 12.0–15.0)
TCO2: 29 mmol/L (ref 0–100)

## 2011-07-18 SURGERY — ABDOMINAL AORTAGRAM
Anesthesia: LOCAL

## 2011-07-18 MED ORDER — OXYCODONE-ACETAMINOPHEN 5-325 MG PO TABS: 1.0000 | ORAL_TABLET | ORAL | Status: DC | PRN

## 2011-07-18 MED ORDER — ACETAMINOPHEN 325 MG PO TABS: 650.0000 mg | ORAL_TABLET | ORAL | Status: DC | PRN

## 2011-07-18 MED ORDER — HEPARIN SODIUM (PORCINE) 1000 UNIT/ML IJ SOLN
INTRAMUSCULAR | Status: AC
  Filled 2011-07-18: qty 1

## 2011-07-18 MED ORDER — HEPARIN (PORCINE) IN NACL 2-0.9 UNIT/ML-% IJ SOLN
INTRAMUSCULAR | Status: AC
  Filled 2011-07-18: qty 1000

## 2011-07-18 MED ORDER — SODIUM CHLORIDE 0.9 % IV SOLN: 250.0000 mL | INTRAVENOUS | Status: DC | PRN

## 2011-07-18 MED ORDER — ONDANSETRON HCL 4 MG/2ML IJ SOLN: 4.0000 mg | Freq: Four times a day (QID) | INTRAMUSCULAR | Status: DC | PRN

## 2011-07-18 MED ORDER — LIDOCAINE HCL (PF) 1 % IJ SOLN
INTRAMUSCULAR | Status: AC
  Filled 2011-07-18: qty 30

## 2011-07-18 MED ORDER — SODIUM CHLORIDE 0.9 % IJ SOLN: 3.0000 mL | Freq: Two times a day (BID) | INTRAMUSCULAR | Status: DC

## 2011-07-18 MED ORDER — MIDAZOLAM HCL 2 MG/2ML IJ SOLN
INTRAMUSCULAR | Status: AC
  Filled 2011-07-18: qty 2

## 2011-07-18 MED ORDER — SODIUM CHLORIDE 0.9 % IJ SOLN: 3.0000 mL | INTRAMUSCULAR | Status: DC | PRN

## 2011-07-18 MED ORDER — FENTANYL CITRATE 0.05 MG/ML IJ SOLN
INTRAMUSCULAR | Status: AC
  Filled 2011-07-18: qty 2

## 2011-07-18 MED ORDER — MORPHINE SULFATE 4 MG/ML IJ SOLN: 2.0000 mg | INTRAMUSCULAR | Status: DC | PRN

## 2011-07-18 NOTE — H&P (View-Only) (Signed)
[facility] [place] HISTORY AND PHYSICAL   History of Present Illness:  Patient is a 78 y.o. year old female who presents for evaluation of 2 problems.  First, the patient has aneurysmal right brachiocephalic AV fistula. She had a left brachiocephalic AV fistula placed with the plan to ligate her right arm fistula. However the aneurysm has not degenerated further. She has had no problems from this. Secondly, she complains of left lower extremity claudication. She has a previous history of PAD. She previously had a right superficial femoral artery stent placed by Dr. Gangi several years ago. The patient currently denies rest pain. However she develops a cramping sensation in her left calf after walking approximately one block. She denies rest pain. She has had no previous ulcers on the left foot. She is a former smoker she denies any right leg symptoms.  Other medical problems include hypertension, hyperlipidemia, coronary disease, fluid overload secondary to renal failure, end-stage renal disease requiring dialysis Monday Wednesday and Friday.  These medical problems are currently stable.  She does take Plavix for antiplatelet therapy.  Past Medical History  Diagnosis Date  . HTN (hypertension)   . Hyperparathyroidism   . Glaucoma   . Hyperlipemia   . Coronary artery disease   . Peripheral vascular disease     CAROTID ARTERY STENOSIS   . Anemia     DUE TO CHRONIC RENAL INSUFFIENCY  . 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 Vulvar lesion   . Shortness of breath     RELATED TO DIALYSIS -TOO MUCH FLUID    Past Surgical History  Procedure Date  . Av fistula placement   . Cholecystectomy   . Coronary artery bypass graft 1997  . Vulvar lesion removal  04/23/2011    Procedure: VULVAR LESION;  Surgeon: Wendy Brewster, MD;  Location: [facility];  Service: Gynecology;  Laterality: N/A;  Wide Local Excision Of Vulvar Lesion      Social History History  Substance Use Topics  . Smoking status: Former Smoker  . Smokeless tobacco: Never Used  . Alcohol Use: No    Family History Family History  Problem Relation Age of Onset  . Colon cancer Neg Hx   . Heart disease Sister     Allergies  Allergies  Allergen Reactions  . Aspirin Itching  . Pletal (Cilostazol)     dizziness     Current Outpatient Prescriptions  Medication Sig Dispense Refill  . B Complex-C-Folic Acid (DIALYVITE 800 PO) Take 1 capsule by mouth daily.       . calcium acetate (PHOSLO) 667 MG capsule Take 667 mg by mouth 3 (three) times daily with meals.       . carvedilol (COREG) 12.5 MG tablet Take 12.5 mg by mouth 2 (two) times daily with a meal. One tablet by mouth once daily      . clopidogrel (PLAVIX) 75 MG tablet Take 75 mg by mouth every morning.       . fosinopril (MONOPRIL) 20 MG tablet Take 20 mg by mouth at bedtime. One tablet by mouth once daily      . LUMIGAN 0.03 % ophthalmic solution Place 1 drop into both eyes at bedtime. Use as directed      .   megestrol (MEGACE) 20 MG tablet Take 30 mg by mouth daily. One tablet by mouth once daily      . simvastatin (ZOCOR) 40 MG tablet Take 40 mg by mouth at bedtime. One tablet by mouth once daily      . traMADol (ULTRAM) 50 MG tablet Take 1 tablet (50 mg total) by mouth every 6 (six) hours as needed for pain. Maximum dose= 8 tablets per day  20 tablet  0    ROS:   General:  No weight loss, Fever, chills  HEENT: No recent headaches, no nasal bleeding, no visual changes, no sore throat  Neurologic: No dizziness, blackouts, seizures. No recent symptoms of stroke or mini- stroke. No recent episodes of slurred speech, or temporary blindness.  Cardiac: No recent episodes of chest pain/pressure, no shortness of breath at  rest.  No shortness of breath with exertion.  Denies history of atrial fibrillation or irregular heartbeat  Vascular: No history of rest pain in feet.  + history of claudication.  No history of non-healing ulcer, No history of DVT   Pulmonary: No home oxygen, no productive cough, no hemoptysis,  No asthma or wheezing  Musculoskeletal:  [ ] Arthritis, [ ] Low back pain,  [ ] Joint pain  Hematologic:No history of hypercoagulable state.  No history of easy bleeding.  No history of anemia  Gastrointestinal: No hematochezia or melena,  No gastroesophageal reflux, no trouble swallowing  Urinary: [x ] chronic Kidney disease, [ ] on HD - [x ] MWF or [ ] TTHS, [ ] Burning with urination, [ ] Frequent urination, [ ] Difficulty urinating;   Skin: No rashes  Psychological: No history of anxiety,  No history of depression   Physical Examination  Filed Vitals:   07/11/11 1501  BP: 147/61  Pulse: 72  Resp: 16  Height: 5' 2" (1.575 m)  Weight: 88 lb (39.917 kg)  SpO2: 97%    Body mass index is 16.10 kg/(m^2).  General:  Alert and oriented, no acute distress HEENT: Normal Neck: No bruit or JVD Pulmonary: Clear to auscultation bilaterally Cardiac: Regular Rate and Rhythm without murmur Abdomen: Soft, non-tender, non-distended, no mass Skin: No rash Extremity Pulses:  2+ radial bilateral, bilateral upper arm fistulas left side has palpable thrill and is easily palpable throughout its course. Right side is aneurysmally degenerated but no skin thinning no ulceration, 2+ femoral right side , one plus femoral left side absent dorsalis pedis, posterior tibial pulses bilaterally, left femoral artery has palpable calcification Musculoskeletal: No deformity or edema  Neurologic: Upper and lower extremity motor 5/5 and symmetric  DATA: ABIs and duplex scan from [facility] dated 06/18/2011 were reviewed. ABIs were not performed due to calcification. The patient had a patent right superficial  femoral artery stent with one-vessel runoff via the peroneal on the right side. On the left side she had monophasic waveforms in the external iliac and common femoral artery suggesting inflow stenosis. She also had a suggestion of a superficial femoral artery stenosis in the mid thigh. There appeared to be two-vessel runoff to the left foot.   ASSESSMENT: Claudication left lower extremity most likely external iliac common femoral and superficial femoral disease with mild tibial disease.  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   PLAN:  Aortogram with bilateral lower extremity runoff possible intervention. To avoid her dialysis have scheduled this for my partner Dr. Chen on a Thursday. If she requires operative intervention I will schedule that for her after   evaluation for a cardiac standpoint. However if Dr. Chen is able to do a percutaneous procedure to improve her flow we'll do that at the time of arteriogram. Risks benefits possible complications and procedure details were explained the patient today including but limited to bleeding infection vessel injury contrast reaction she understands and agrees to proceed.  As far as her right upper arm AV fistula aneurysm is concerned, as long as this does not continue to expand or have thin skin or ulceration we will continue to observe this for now.  Teleshia Lemere, MD [facility] [place] Office: [phone] Pager: [phone]  

## 2011-07-18 NOTE — Progress Notes (Signed)
Pt's daughter is working until 1630 and will not have anyone at home with her.  She will stay and be discharged at 1630 with daughter who will then be able to stay with her for 24 hrs.

## 2011-07-18 NOTE — Interval H&P Note (Signed)
[facility] [place]  History and Physical Update  The patient was interviewed and re-examined.  The patient's History and Physical has been reviewed and is unchanged.  There is no change in the plan of care.  Leonides Sake, MD [facility] [place] Office: [phone] Pager: [phone]  07/18/2011, 7:08 AM

## 2011-07-18 NOTE — Op Note (Signed)
OPERATIVE NOTE   PROCEDURE: 1.  Right common femoral artery cannulation under ultrasound guidance 2.  Aortogram 3.  Left external iliac artery stenting and angioplasty (7 mm x 22 mm) 4.  Left leg runoff 5.  Right leg runoff  PRE-OPERATIVE DIAGNOSIS: Left > right leg claudication  POST-OPERATIVE DIAGNOSIS: same as above   SURGEON: Leonides Sake, MD  ANESTHESIA: conscious sedation  ESTIMATED BLOOD LOSS: 30 cc  CONTRAST: 150 cc  FINDING(S):  Aorta: patent but diseased  Superior mesenteric artery: not seen Celiac artery: not seen  Right Left  RA Patent, diseased, no nephrogram Patent, diseased, no nephrogram  CIA Patent Patent  EIA Patent Patent but >90% stenosis: resolved after stenting and angioplasty  IIA Patent Patent but severe orifical stenosis  CFA Patent Patent  SFA Patent, SFA stents patent with some in-stent restenosis Patent proximal, occludes mid-segment  PFA Patent Patent  Pop Patent Patent  Trif Patent but Heavily diseased Patent but diseased  AT [place] Occludes shortly after takeoff  Pero Patent, only runoff to foot Patent: dominant runoff to foot  PT Patent, miniscule runoff to foot Patent and small: runoff to foot   SPECIMEN(S):  none  INDICATIONS:   Courtney [name] is a 78 y.o. female who presents with bilateral claudication, Left > right.  The patient presents for diagnostic angiogram and possible intervention.  I discussed with the patient the nature of angiographic procedures, especially the limited patencies of any endovascular intervention.  The patient is aware of that the risks of an angiographic procedure include but are not limited to: bleeding, infection, access site complications, renal failure, embolization, rupture of vessel, dissection, possible need for emergent surgical intervention, possible need for surgical procedures to treat the patient's pathology, and stroke and death.  The patient is aware of the risks and agrees to  proceed.  DESCRIPTION: After full informed consent was obtained from the patient, the patient was brought back to the angiography suite.  The patient was placed supine upon the angiography table and connected to monitoring equipment.  The patient was then given conscious sedation, the amounts of which are documented in the patient's chart.  The patient was prepped and drape in the standard fashion for an angiographic procedure.  At this point, attention was turned to the right groin.  Under ultrasound guidance, the right common femoral artery was cannulated with a micropuncture needle.  The microwire was advanced into the iliac arterial system.  The needle was exchanged for a microsheath, which was loaded into the common femoral artery over the wire.  The microwire was exchanged for a [facility] wire which was advanced into the aorta.  The microsheath was then exchanged for a 5-Fr sheath which was loaded into the common femoral artery.  The Omniflush catheter was then loaded over the wire up to the level of L1.  The catheter was connected to the power injector circuit.  After de-airring and de-clotting the circuit, a power injector aortogram was completed.  Based on these images, I felt that intervention on the left external iliac artery was necessary prior to further diagnostic imaging.  The [facility] wire was replaced in the catheter, and using the [place] and Omniflush catheter, the left common iliac artery was selected.  The catheter and wire were advanced into the external iliac artery.  The catheter was exchanged for an end hole catheter and the wire was exchanged for an [employer].  The wire was exchanged for a Rosen wire.  The patient's right femoral  sheath was exchanged for a 7-Fr Pinnacle sheath, which was lodged in the left common iliac artery.  The patient was given 5000 units of Heparin intravenously to obtain therapeutic anticoagulation.  The dilator was removed.  A hand injection under magnified view  verified the location of the external iliac artery stenosis.  I selected a 7 mm x 22 mm iCast covered stent and placed it center around this external iliac artery stenosis.  It was deployed by inflating the balloon to profile.  There remained a waist, so I obtained a 8 mm x 20 mm angioplasty balloon and inflated this to profile.  This eliminated the waist in the stent and opposed the stent fully to the wall.  The balloon was deployed and removed.  The sheath was connected to the power injector circuit after a de-airring and de-clotting maneuver.  An automated left leg runoff was completed.  The findings of which are listed above.  I did not think a subintimal angioplasty was going to be successful in this case due to the extensive collateralization in this patient.  I was also concerned that a subintimal dissection might compromise the collateral reconstitution of the left above the knee popliteal artery.  I then pulled the sheath back to the right external iliac artery and then an automated right leg runoff was completed.  The sheath was aspirated.  No clots were present and the sheath was reloaded with heparinized saline.  The plan is to pull this sheath in the holding area.  COMPLICATIONS: none  CONDITION: stable   Leonides Sake, MD [facility] [place] Office: [phone] Pager: [phone]  07/18/2011, 9:46 AM

## 2011-07-19 LAB — POCT ACTIVATED CLOTTING TIME
Activated Clotting Time: 226 seconds
Activated Clotting Time: 188 seconds

## 2011-08-21 ENCOUNTER — Encounter: Payer: Self-pay | Admitting: Vascular Surgery

## 2011-08-22 ENCOUNTER — Other Ambulatory Visit: Payer: Self-pay

## 2011-08-22 ENCOUNTER — Encounter: Payer: Self-pay | Admitting: Vascular Surgery

## 2011-08-22 ENCOUNTER — Ambulatory Visit (INDEPENDENT_AMBULATORY_CARE_PROVIDER_SITE_OTHER): Payer: Medicare Other | Admitting: Vascular Surgery

## 2011-08-22 VITALS — BP 126/70 | HR 67 | Resp 18 | Ht 61.0 in | Wt 90.4 lb

## 2011-08-22 DIAGNOSIS — N186 End stage renal disease: Secondary | ICD-10-CM

## 2011-08-28 ENCOUNTER — Encounter (HOSPITAL_COMMUNITY): Payer: Self-pay | Admitting: Pharmacy Technician

## 2011-08-29 NOTE — Progress Notes (Signed)
Patient sent for evaluation today for 2 separate problems.  First she is sent for evaluation of low flow in the left brachiocephalic AV fistula. The flow so low that they occasionally her having to use her right arm aneurysmal fistula. The patient denies any steal symptoms in her hand. The fistula was originally placed in November of 2001 by myself.  She dialyzes on Monday Wednesday and Friday.  Next, the patient is also here for followup after her left external iliac artery stent. She currently does not complain of any claudication symptoms.  The iliac stent was placed on 07/18/2011 by Dr. Imogene Burn.    Review of systems: The patient denies any shortness of breath. Patient denies any chest pain.  Physical exam:  Filed Vitals:   08/22/11 1332  BP: 126/70  Pulse: 67  Resp: 18  Height: 5\' 1"  (1.549 m)  Weight: 90 lb 6.4 oz (41.005 kg)    Lower extremities: 2+ left femoral pulse with no ulcerations on the feet Right arm aneurysmal fistula with palpable thrill, left arm AV fistula with audible bruit Chest clear to auscultation Cardiac: Regular rate and rhythm  Assessment: #1 patent external iliac artery stent with resolution of claudication symptoms. #2 poor flow left arm AV fistula. We will schedule her for a fistulogram in the near future.

## 2011-09-05 ENCOUNTER — Encounter (HOSPITAL_COMMUNITY)
Admission: RE | Disposition: A | Discharge status: Home / Self Care | Payer: Self-pay | Source: Ambulatory Visit | Attending: Vascular Surgery

## 2011-09-05 ENCOUNTER — Ambulatory Visit (HOSPITAL_COMMUNITY)
Admission: RE | Admit: 2011-09-05 | Discharge: 2011-09-07 | Disposition: A | Discharge status: Home / Self Care | Payer: Medicare Other | Source: Ambulatory Visit | Attending: Vascular Surgery | Admitting: Vascular Surgery

## 2011-09-05 DIAGNOSIS — T82898A Other specified complication of vascular prosthetic devices, implants and grafts, initial encounter: Secondary | ICD-10-CM

## 2011-09-05 DIAGNOSIS — T82598A Other mechanical complication of other cardiac and vascular devices and implants, initial encounter: Secondary | ICD-10-CM | POA: Insufficient documentation

## 2011-09-05 DIAGNOSIS — N186 End stage renal disease: Secondary | ICD-10-CM | POA: Insufficient documentation

## 2011-09-05 DIAGNOSIS — Z992 Dependence on renal dialysis: Secondary | ICD-10-CM | POA: Insufficient documentation

## 2011-09-05 DIAGNOSIS — D631 Anemia in chronic kidney disease: Secondary | ICD-10-CM | POA: Insufficient documentation

## 2011-09-05 DIAGNOSIS — Y849 Medical procedure, unspecified as the cause of abnormal reaction of the patient, or of later complication, without mention of misadventure at the time of the procedure: Secondary | ICD-10-CM | POA: Insufficient documentation

## 2011-09-05 DIAGNOSIS — I120 Hypertensive chronic kidney disease with stage 5 chronic kidney disease or end stage renal disease: Secondary | ICD-10-CM | POA: Insufficient documentation

## 2011-09-05 HISTORY — PX: SHUNTOGRAM: SHX5510

## 2011-09-05 LAB — POCT I-STAT, CHEM 8
BUN: 25 mg/dL — ABNORMAL HIGH (ref 6–23)
Chloride: 101 mEq/L (ref 96–112)
Sodium: 142 mEq/L (ref 135–145)

## 2011-09-05 SURGERY — SHUNTOGRAM
Anesthesia: LOCAL

## 2011-09-05 MED ORDER — SODIUM CHLORIDE 0.9 % IJ SOLN: 3.0000 mL | INTRAMUSCULAR | Status: DC | PRN

## 2011-09-05 MED ORDER — LIDOCAINE HCL (PF) 1 % IJ SOLN
INTRAMUSCULAR | Status: AC
  Filled 2011-09-05: qty 30

## 2011-09-05 MED ORDER — HEPARIN (PORCINE) IN NACL 2-0.9 UNIT/ML-% IJ SOLN
INTRAMUSCULAR | Status: AC
  Filled 2011-09-05: qty 1000

## 2011-09-05 NOTE — Discharge Instructions (Signed)
  CALL FOR ANY UNCONTROLLED BLEEDING.  CALL FOR ANY SIGNS OR SYMPTOMS OF INFECTION: FEVER, REDNESS, OOZING.

## 2011-09-05 NOTE — H&P (Signed)
[facility]   Brief Access History and Physical  History of Present Illness  Courtney [name] is a 78 y.o. female who presents with chief complaint: malfunctioning L BC AVF.  The patient presents today for L arm fistulogram, possible itnervention.    Past Medical History  Diagnosis Date  . HTN (hypertension)   . Hyperparathyroidism   . Glaucoma   . Hyperlipemia   . Coronary artery disease   . Peripheral vascular disease     CAROTID ARTERY STENOSIS   . Anemia     DUE TO CHRONIC RENAL INSUFFIENCY  . 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 Vulvar lesion   . Shortness of breath     RELATED TO DIALYSIS -TOO MUCH FLUID    Past Surgical History  Procedure Date  . Av fistula placement   . Cholecystectomy   . Coronary artery bypass graft 1997  . Vulvar lesion removal 04/23/2011    Procedure: VULVAR LESION;  Surgeon: Wendy Brewster, MD;  Location: [facility];  Service: Gynecology;  Laterality: N/A;  Wide Local Excision Of Vulvar Lesion   . Iliac artery stenting and angioplasty     History   Social History  . Marital Status: Single    Spouse Name: N/A    Number of Children: 1  . Years of Education: N/A   Occupational History  . Retired    Social History Main Topics  . Smoking status: Former Smoker  . Smokeless tobacco: Never Used  . Alcohol Use: No  . Drug Use: No  . Sexually Active: No   Other Topics Concern  . Not on file   Social History Narrative  . No narrative on file    Family History  Problem Relation Age of Onset  . Colon cancer Neg Hx   . Heart disease Sister     No current facility-administered medications on file prior to encounter.   Current Outpatient Prescriptions on File Prior to Encounter    Medication Sig Dispense Refill  . B Complex-C-Folic Acid (DIALYVITE 800 PO) Take 1 capsule by mouth daily.       . calcium acetate (PHOSLO) 667 MG capsule Take 667 mg by mouth 3 (three) times daily with meals.       . carvedilol (COREG) 12.5 MG tablet Take 12.5 mg by mouth 2 (two) times daily with a meal.       . clopidogrel (PLAVIX) 75 MG tablet Take 75 mg by mouth every morning.       . fosinopril (MONOPRIL) 20 MG tablet Take 20 mg by mouth at bedtime.       . LUMIGAN 0.03 % ophthalmic solution Place 1 drop into both eyes at bedtime.       . megestrol (MEGACE) 20 MG tablet Take 30 mg by mouth daily.       . polyvinyl alcohol (LIQUIFILM TEARS) 1.4 % ophthalmic solution Place 2 drops into both eyes daily as needed. For dry eyes.      . simvastatin (ZOCOR) 40 MG tablet Take 40 mg by mouth at bedtime.         Allergies  Allergen Reactions  . Aspirin Itching  . Pletal (Cilostazol)     dizziness      Review of Systems: Kidney Disease, As listed above, otherwise negative.  Physical Examination  Filed Vitals:   09/05/11 0550  BP: 141/73  Pulse: 66  Temp: 97.1 F (36.2 C)  TempSrc: Oral  Resp: 18  Height: 5' 1.5" (1.562 m)  Weight: 90 lb (40.824 kg)  SpO2: 97%   Body mass index is 16.73 kg/(m^2).  General: A&O x 3, WDWN, cachectic  Pulmonary: Sym exp, good air movt, CTAB, no rales, rhonchi, & wheezing  Cardiac: RRR, Nl S1, S2, no Murmurs, rubs or gallops  Gastrointestinal: soft, NTND, -G/R, - HSM, - masses, - CVAT B  Musculoskeletal: M/S 5/5 throughout , Extremities without ischemic changes , L BC AVF with palpable thrill, +bruit Laboratory See iStat  Medical Decision Making  Courtney [name] is a 78 y.o. female who presents with: malfunctioning L BC AVF.   The patient is scheduled for: L arm fistulogram, possible intervention I discussed with the patient the nature of angiographic procedures, especially the limited patencies of any endovascular intervention.  The  patient is aware of that the risks of an angiographic procedure include but are not limited to: bleeding, infection, access site complications, renal failure, embolization, rupture of vessel, dissection, possible need for emergent surgical intervention, possible need for surgical procedures to treat the patient's pathology, and stroke and death.    The patient is aware of the risks and agrees to proceed.  [name], MD [facility] [place] Office: [phone] Pager: [phone]  09/05/2011, 7:04 AM    

## 2011-09-05 NOTE — Op Note (Signed)
OPERATIVE NOTE   PROCEDURE: 1. left brachiocephalic arteriovenous fistula cannulation under ultrasound guidance 2. left arm fistulogram  PRE-OPERATIVE DIAGNOSIS: Malfunctioning left arteriovenous fistula  POST-OPERATIVE DIAGNOSIS: same as above   SURGEON: Leonides Sake, MD  ANESTHESIA: local  ESTIMATED BLOOD LOSS: 5 cc  FINDING(S): 1. Widely patent venous outflow without any central venous stenosis 2. High grade stenosis near arterial anastomosis, better revised surgically  SPECIMEN(S):  None  CONTRAST: 25 cc  INDICATIONS: Courtney [name] is a 78 y.o. female who  presents with malfunctioning left brachiocephalic arteriovenous fistula.  The patient is scheduled for left arm fistulogram.  The patient is aware the risks include but are not limited to: bleeding, infection, thrombosis of the cannulated access, and possible anaphylactic reaction to the contrast.  The patient is aware of the risks of the procedure and elects to proceed forward.  DESCRIPTION: After full informed written consent was obtained, the patient was brought back to the angiography suite and placed supine upon the angiography table.  The patient was connected to monitoring equipment.  The left arm was prepped and draped in the standard fashion for a left arm fistulogram.  Under ultrasound guidance, the left brachiocephalic arteriovenous fistula was cannulated with a micropuncture needle.  The microwire was advanced into the fistula and the needle was exchanged for the a microsheath, which was lodged 2 cm into the access.  The wire was removed and the sheath was connected to the IV extension tubing.  Hand injections were completed to image the access from the antecubitum up to the level of axilla.  The central venous structures were also imaged by hand injections.  Based on the images, this patient will need: surgical revision of the arterial anastomosis.  Given the cephalic vein is widely patent, I believe surgical  revision of the arterial anastomosis will result in longer long-term patency.  A 4-0 Monocryl purse-string suture was sewn around the sheath.  The sheath was removed while tying down the suture.  A sterile bandage was applied to the puncture site.  COMPLICATIONS: none  CONDITION: stable  Leonides Sake, MD [facility] [place] Office: [phone] Pager: [phone]  09/05/2011 7:27 AM

## 2011-09-17 ENCOUNTER — Encounter (HOSPITAL_COMMUNITY): Payer: Self-pay | Admitting: Pharmacy Technician

## 2011-09-19 ENCOUNTER — Other Ambulatory Visit: Payer: Self-pay

## 2011-09-23 ENCOUNTER — Encounter (HOSPITAL_COMMUNITY): Payer: Self-pay | Admitting: *Deleted

## 2011-09-23 MED ORDER — CEFAZOLIN SODIUM 1-5 GM-% IV SOLN
1.0000 g | Freq: Once | INTRAVENOUS | Status: AC
  Administered 2011-09-24: 1 g via INTRAVENOUS
  Filled 2011-09-23: qty 50

## 2011-09-24 ENCOUNTER — Encounter (HOSPITAL_COMMUNITY): Payer: Self-pay | Admitting: *Deleted

## 2011-09-24 ENCOUNTER — Encounter (HOSPITAL_COMMUNITY): Payer: Self-pay | Admitting: Anesthesiology

## 2011-09-24 ENCOUNTER — Encounter (HOSPITAL_COMMUNITY)
Admission: RE | Disposition: A | Discharge status: Home / Self Care | Payer: Self-pay | Source: Ambulatory Visit | Attending: Vascular Surgery

## 2011-09-24 ENCOUNTER — Telehealth: Payer: Self-pay | Admitting: Vascular Surgery

## 2011-09-24 ENCOUNTER — Ambulatory Visit (HOSPITAL_COMMUNITY)
Admission: RE | Admit: 2011-09-24 | Discharge: 2011-09-24 | Disposition: A | Discharge status: Home / Self Care | Payer: Medicare Other | Source: Ambulatory Visit | Attending: Vascular Surgery | Admitting: Vascular Surgery

## 2011-09-24 ENCOUNTER — Ambulatory Visit (HOSPITAL_COMMUNITY): Payer: Medicare Other | Admitting: Anesthesiology

## 2011-09-24 DIAGNOSIS — I120 Hypertensive chronic kidney disease with stage 5 chronic kidney disease or end stage renal disease: Secondary | ICD-10-CM | POA: Insufficient documentation

## 2011-09-24 DIAGNOSIS — T82898A Other specified complication of vascular prosthetic devices, implants and grafts, initial encounter: Secondary | ICD-10-CM | POA: Insufficient documentation

## 2011-09-24 DIAGNOSIS — E785 Hyperlipidemia, unspecified: Secondary | ICD-10-CM | POA: Insufficient documentation

## 2011-09-24 DIAGNOSIS — Z8673 Personal history of transient ischemic attack (TIA), and cerebral infarction without residual deficits: Secondary | ICD-10-CM | POA: Insufficient documentation

## 2011-09-24 DIAGNOSIS — D638 Anemia in other chronic diseases classified elsewhere: Secondary | ICD-10-CM | POA: Insufficient documentation

## 2011-09-24 DIAGNOSIS — I2510 Atherosclerotic heart disease of native coronary artery without angina pectoris: Secondary | ICD-10-CM | POA: Insufficient documentation

## 2011-09-24 DIAGNOSIS — Y832 Surgical operation with anastomosis, bypass or graft as the cause of abnormal reaction of the patient, or of later complication, without mention of misadventure at the time of the procedure: Secondary | ICD-10-CM | POA: Insufficient documentation

## 2011-09-24 DIAGNOSIS — R0602 Shortness of breath: Secondary | ICD-10-CM | POA: Insufficient documentation

## 2011-09-24 DIAGNOSIS — H409 Unspecified glaucoma: Secondary | ICD-10-CM | POA: Insufficient documentation

## 2011-09-24 DIAGNOSIS — N186 End stage renal disease: Secondary | ICD-10-CM

## 2011-09-24 DIAGNOSIS — I252 Old myocardial infarction: Secondary | ICD-10-CM | POA: Insufficient documentation

## 2011-09-24 DIAGNOSIS — I739 Peripheral vascular disease, unspecified: Secondary | ICD-10-CM | POA: Insufficient documentation

## 2011-09-24 HISTORY — DX: Acute myocardial infarction, unspecified: I21.9

## 2011-09-24 LAB — POCT I-STAT 4, (NA,K, GLUC, HGB,HCT): Potassium: 3.5 mEq/L (ref 3.5–5.1)

## 2011-09-24 LAB — SURGICAL PCR SCREEN
MRSA, PCR: NEGATIVE
Staphylococcus aureus: NEGATIVE

## 2011-09-24 SURGERY — REVISON OF ARTERIOVENOUS FISTULA
Anesthesia: Monitor Anesthesia Care | Site: Arm Upper | Laterality: Left | Wound class: Clean

## 2011-09-24 MED ORDER — MIDAZOLAM HCL 2 MG/2ML IJ SOLN: 1.0000 mg | INTRAMUSCULAR | Status: DC | PRN

## 2011-09-24 MED ORDER — BUPIVACAINE HCL (PF) 0.5 % IJ SOLN
INTRAMUSCULAR | Status: DC | PRN
  Administered 2011-09-24: 25 mL

## 2011-09-24 MED ORDER — THROMBIN 20000 UNITS EX KIT
PACK | CUTANEOUS | Status: DC | PRN
  Administered 2011-09-24: 09:00:00 via TOPICAL

## 2011-09-24 MED ORDER — 0.9 % SODIUM CHLORIDE (POUR BTL) OPTIME
TOPICAL | Status: DC | PRN
  Administered 2011-09-24: 1000 mL

## 2011-09-24 MED ORDER — LORAZEPAM 2 MG/ML IJ SOLN: 1.0000 mg | Freq: Once | INTRAMUSCULAR | Status: DC | PRN

## 2011-09-24 MED ORDER — PROPOFOL 10 MG/ML IV EMUL
INTRAVENOUS | Status: DC | PRN
  Administered 2011-09-24: 25 ug/kg/min via INTRAVENOUS

## 2011-09-24 MED ORDER — FENTANYL CITRATE 0.05 MG/ML IJ SOLN: 50.0000 ug | INTRAMUSCULAR | Status: DC | PRN

## 2011-09-24 MED ORDER — OXYCODONE HCL 5 MG PO TABS: 5.0000 mg | ORAL_TABLET | ORAL | Status: AC | PRN

## 2011-09-24 MED ORDER — MIDAZOLAM HCL 5 MG/5ML IJ SOLN
INTRAMUSCULAR | Status: DC | PRN
  Administered 2011-09-24: .5 mg via INTRAVENOUS

## 2011-09-24 MED ORDER — SODIUM CHLORIDE 0.9 % IR SOLN
Status: DC | PRN
  Administered 2011-09-24: 08:00:00

## 2011-09-24 MED ORDER — FENTANYL CITRATE 0.05 MG/ML IJ SOLN
INTRAMUSCULAR | Status: DC | PRN
  Administered 2011-09-24: 50 ug via INTRAVENOUS
  Administered 2011-09-24: 25 ug via INTRAVENOUS

## 2011-09-24 MED ORDER — FENTANYL CITRATE 0.05 MG/ML IJ SOLN
25.0000 ug | INTRAMUSCULAR | Status: DC | PRN
  Administered 2011-09-24: 25 ug via INTRAVENOUS

## 2011-09-24 MED ORDER — MUPIROCIN 2 % EX OINT
TOPICAL_OINTMENT | CUTANEOUS | Status: AC
  Administered 2011-09-24: 1 via NASAL
  Filled 2011-09-24: qty 22

## 2011-09-24 MED ORDER — SODIUM CHLORIDE 0.9 % IV SOLN
INTRAVENOUS | Status: DC | PRN
  Administered 2011-09-24 (×2): via INTRAVENOUS

## 2011-09-24 MED ORDER — SODIUM CHLORIDE 0.9 % IV SOLN: INTRAVENOUS | Status: DC

## 2011-09-24 MED ORDER — LIDOCAINE-EPINEPHRINE (PF) 1 %-1:200000 IJ SOLN
INTRAMUSCULAR | Status: DC | PRN
  Administered 2011-09-24: 25 mL

## 2011-09-24 SURGICAL SUPPLY — 40 items
ADH SKN CLS APL DERMABOND .7 (GAUZE/BANDAGES/DRESSINGS) ×1
CANISTER SUCTION 2500CC (MISCELLANEOUS) ×2 IMPLANT
CLIP TI MEDIUM 6 (CLIP) ×2 IMPLANT
CLIP TI WIDE RED SMALL 6 (CLIP) ×2 IMPLANT
CLOTH BEACON ORANGE TIMEOUT ST (SAFETY) ×2 IMPLANT
COVER PROBE W GEL 5X96 (DRAPES) IMPLANT
COVER SURGICAL LIGHT HANDLE (MISCELLANEOUS) ×4 IMPLANT
DECANTER SPIKE VIAL GLASS SM (MISCELLANEOUS) ×2 IMPLANT
DERMABOND ADVANCED (GAUZE/BANDAGES/DRESSINGS) ×1
DERMABOND ADVANCED .7 DNX12 (GAUZE/BANDAGES/DRESSINGS) ×1 IMPLANT
DRAIN PENROSE 1/2X12 LTX STRL (WOUND CARE) IMPLANT
ELECT REM PT RETURN 9FT ADLT (ELECTROSURGICAL) ×2
ELECTRODE REM PT RTRN 9FT ADLT (ELECTROSURGICAL) ×1 IMPLANT
GLOVE BIO SURGEON STRL SZ 6.5 (GLOVE) ×1 IMPLANT
GLOVE BIO SURGEON STRL SZ7 (GLOVE) ×2 IMPLANT
GLOVE BIO SURGEON STRL SZ7.5 (GLOVE) ×1 IMPLANT
GLOVE BIOGEL PI IND STRL 6.5 (GLOVE) IMPLANT
GLOVE BIOGEL PI IND STRL 7.0 (GLOVE) IMPLANT
GLOVE BIOGEL PI IND STRL 7.5 (GLOVE) ×1 IMPLANT
GLOVE BIOGEL PI INDICATOR 6.5 (GLOVE) ×1
GLOVE BIOGEL PI INDICATOR 7.0 (GLOVE) ×2
GLOVE BIOGEL PI INDICATOR 7.5 (GLOVE) ×1
GLOVE ECLIPSE 6.5 STRL STRAW (GLOVE) ×1 IMPLANT
GOWN PREVENTION PLUS XXLARGE (GOWN DISPOSABLE) ×1 IMPLANT
GOWN STRL NON-REIN LRG LVL3 (GOWN DISPOSABLE) ×4 IMPLANT
KIT BASIN OR (CUSTOM PROCEDURE TRAY) ×2 IMPLANT
KIT ROOM TURNOVER OR (KITS) ×2 IMPLANT
NS IRRIG 1000ML POUR BTL (IV SOLUTION) ×2 IMPLANT
PACK CV ACCESS (CUSTOM PROCEDURE TRAY) ×2 IMPLANT
PAD ARMBOARD 7.5X6 YLW CONV (MISCELLANEOUS) ×4 IMPLANT
SPONGE SURGIFOAM ABS GEL 100 (HEMOSTASIS) IMPLANT
SUT MNCRL AB 4-0 PS2 18 (SUTURE) ×2 IMPLANT
SUT PROLENE 6 0 BV (SUTURE) ×1 IMPLANT
SUT PROLENE 7 0 BV 1 (SUTURE) ×2 IMPLANT
SUT VIC AB 3-0 SH 27 (SUTURE) ×2
SUT VIC AB 3-0 SH 27X BRD (SUTURE) ×1 IMPLANT
TOWEL OR 17X24 6PK STRL BLUE (TOWEL DISPOSABLE) ×3 IMPLANT
TOWEL OR 17X26 10 PK STRL BLUE (TOWEL DISPOSABLE) ×2 IMPLANT
UNDERPAD 30X30 INCONTINENT (UNDERPADS AND DIAPERS) ×2 IMPLANT
WATER STERILE IRR 1000ML POUR (IV SOLUTION) ×2 IMPLANT

## 2011-09-24 NOTE — Interval H&P Note (Signed)
[facility] [place]  History and Physical Update  The patient was interviewed and re-examined.  The patient's previous History and Physical has been reviewed and is unchanged except for: interval fistulogram.  The plan is revision of L arm fistulogram anastomosis.  Leonides Sake, MD [facility] [place] Office: [phone] Pager: [phone]  09/24/2011, 7:31 AM

## 2011-09-24 NOTE — Transfer of Care (Signed)
Immediate Anesthesia Transfer of Care Note  Patient: Courtney [name]  Procedure(s) Performed: Procedure(s) (LRB): REVISON OF ARTERIOVENOUS FISTULA (Left) LIGATION OF COMPETING BRANCHES OF ARTERIOVENOUS FISTULA (Left)  Patient Location: PACU  Anesthesia Type: MAC  Level of Consciousness: awake, alert  and oriented  Airway & Oxygen Therapy: Patient Spontanous Breathing and Patient connected to nasal cannula oxygen  Post-op Assessment: Report given to PACU RN, Post -op Vital signs reviewed and stable and Patient moving all extremities  Post vital signs: Reviewed and stable  Complications: No apparent anesthesia complications

## 2011-09-24 NOTE — H&P (View-Only) (Signed)
[facility] [place]  Brief Access History and Physical  History of Present Illness  Courtney [name] is a 78 y.o. female who presents with chief complaint: malfunctioning L BC AVF.  The patient presents today for L arm fistulogram, possible itnervention.    Past Medical History  Diagnosis Date  . HTN (hypertension)   . Hyperparathyroidism   . Glaucoma   . Hyperlipemia   . Coronary artery disease   . Peripheral vascular disease     CAROTID ARTERY STENOSIS   . Anemia     DUE TO CHRONIC RENAL INSUFFIENCY  . 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 Vulvar lesion   . Shortness of breath     RELATED TO DIALYSIS -TOO MUCH FLUID    Past Surgical History  Procedure Date  . Av fistula placement   . Cholecystectomy   . Coronary artery bypass graft 1997  . Vulvar lesion removal 04/23/2011    Procedure: VULVAR LESION;  Surgeon: Laurette Schimke, MD;  Location: [facility];  Service: Gynecology;  Laterality: N/A;  Wide Local Excision Of Vulvar Lesion   . Iliac artery stenting and angioplasty     History   Social History  . Marital Status: Single    Spouse Name: N/A    Number of Children: 1  . Years of Education: N/A   Occupational History  . Retired    Social History Main Topics  . Smoking status: Former Games developer  . Smokeless tobacco: Never Used  . Alcohol Use: No  . Drug Use: No  . Sexually Active: No   Other Topics Concern  . Not on file   Social History Narrative  . No narrative on file    Family History  Problem Relation Age of Onset  . Colon cancer Neg Hx   . Heart disease Sister     No current facility-administered medications on file prior to encounter.   Current Outpatient Prescriptions on File Prior to Encounter    Medication Sig Dispense Refill  . B Complex-C-Folic Acid (DIALYVITE 800 PO) Take 1 capsule by mouth daily.       . calcium acetate (PHOSLO) 667 MG capsule Take 667 mg by mouth 3 (three) times daily with meals.       . carvedilol (COREG) 12.5 MG tablet Take 12.5 mg by mouth 2 (two) times daily with a meal.       . clopidogrel (PLAVIX) 75 MG tablet Take 75 mg by mouth every morning.       . fosinopril (MONOPRIL) 20 MG tablet Take 20 mg by mouth at bedtime.       Marland Kitchen LUMIGAN 0.03 % ophthalmic solution Place 1 drop into both eyes at bedtime.       . megestrol (MEGACE) 20 MG tablet Take 30 mg by mouth daily.       . polyvinyl alcohol (LIQUIFILM TEARS) 1.4 % ophthalmic solution Place 2 drops into both eyes daily as needed. For dry eyes.      . simvastatin (ZOCOR) 40 MG tablet Take 40 mg by mouth at bedtime.         Allergies  Allergen Reactions  . Aspirin Itching  . Pletal (Cilostazol)     dizziness  Review of Systems: Kidney Disease, As listed above, otherwise negative.  Physical Examination  Filed Vitals:   09/05/11 0550  BP: 141/73  Pulse: 66  Temp: 97.1 F (36.2 C)  TempSrc: Oral  Resp: 18  Height: 5' 1.5" (1.562 m)  Weight: 90 lb (40.824 kg)  SpO2: 97%   Body mass index is 16.73 kg/(m^2).  General: A&O x 3, WDWN, cachectic  Pulmonary: Sym exp, good air movt, CTAB, no rales, rhonchi, & wheezing  Cardiac: RRR, Nl S1, S2, no Murmurs, rubs or gallops  Gastrointestinal: soft, NTND, -G/R, - HSM, - masses, - CVAT B  Musculoskeletal: M/S 5/5 throughout , Extremities without ischemic changes , L BC AVF with palpable thrill, +bruit Laboratory See iStat  Medical Decision Making  Courtney [name] is a 78 y.o. female who presents with: malfunctioning L BC AVF.   The patient is scheduled for: L arm fistulogram, possible intervention I discussed with the patient the nature of angiographic procedures, especially the limited patencies of any endovascular intervention.  The  patient is aware of that the risks of an angiographic procedure include but are not limited to: bleeding, infection, access site complications, renal failure, embolization, rupture of vessel, dissection, possible need for emergent surgical intervention, possible need for surgical procedures to treat the patient's pathology, and stroke and death.    The patient is aware of the risks and agrees to proceed.  Leonides Sake, MD [facility] [place] Office: [phone] Pager: [phone]  09/05/2011, 7:04 AM

## 2011-09-24 NOTE — Op Note (Signed)
OPERATIVE NOTE   PROCEDURE: 1. Revision of left brachiocephalic arteriovenous fistula anastomosis  2. Ligation of competing side branch  PRE-OPERATIVE DIAGNOSIS: Anastomotic stenosis  POST-OPERATIVE DIAGNOSIS: same as above   SURGEON: Leonides Sake, MD  ASSISTANT(S): Della Goo, [facility]   ANESTHESIA: local and MAC  ESTIMATED BLOOD LOSS: 30 cc  FINDING(S): 1. Stenosis near anastomosis found to be due to valves 2. Arteriovenous fistula is actually a radiocephalic due to high brachial bifurcation 3. Dopplerable radial signal at end of case.  Stronger ulnar signal at end of case. 4. Palpable thrill in fistula at end of case  SPECIMEN(S):  none  INDICATIONS:   Courtney [name] is a 78 y.o. female who presents with anastomotic stenosis in left brachiocephalic arteriovenous fistula on fistulogram.   Based on the these images, I felt this was an anastomotic issue with the vein, possibly a sclerotic valve so surgical revision would have longer term patency.  The patient is scheduled for left brachiocephalic arteriovenous fistula anastomosis revision.  The patient is aware the risks include but are not limited to: bleeding, infection, steal syndrome, nerve damage, ischemic monomelic neuropathy, failure to mature, and need for additional procedures.  The patient is aware of the risks of the procedure and elects to proceed forward.  DESCRIPTION: After full informed written consent was obtained from the patient, the patient was brought back to the operating room and placed supine upon the operating table.  Prior to induction, the patient received IV antibiotics.   After obtaining adequate anesthesia, the patient was then prepped and draped in the standard fashion for a left arm access procedure.  At this point, I injected local anesthetic at the area of the anastomosis to obtain a field block of the antecubitum.  In total, I injected about 20 mL of a 1:1 mixture of 0.5% Marcaine without epinephrine  and 1% lidocaine with epinephrine.  I made a longitudinal incision at the over the anastomosis and dissected through the subcutaneous tissue and fascia to gain exposure of the brachial artery.  With additional dissection, I found that the anastomosis was in fact to the radial artery and this patient has a high brachial bifurcation.  I dissected out the anastomosis completely and then placed the radial artery under tension proximally and distally with vessel loops.  I clamped the cephalic vein and then sharply took the vein off the artery, leaving a small cuff of vein on the artery.  Inspecting the vein verified the presence of multiple sclerotic valves.  I transected the distal vein to removed this segment of vein.  I inspected the artery and it appeared widely patent without evidence of neointimal hyperplasia.  I then instilled the heparinized saline into the vein and clamped it.  I injected heparinized saline proximal and distal to this arteriotomy.  The vein was then sewn to the radial artery or vein cuff (depending of adequacy of vein cuff) in an end-to-side configuration with a running stitch of 6-0 Prolene.  I Prior to completing this anastomosis, I allowed the vein and artery to backbleed.  There was no evidence of clot from any vessels.  I completed the anastomosis in the usual fashion and then released all vessel loops and clamps.  There was a weakly palpable  thrill in the venous outflow, and there was a dopplerable radial signal which did not augment with compression of venous outflow.  There was also a strong ulnar signal.  I placed thrombin and gelfoam in the incision.  I turned  my attention to a large sidebranch in the upper arm.  After injecting 2 cc of the local anesthetic mixture, I made a stab incision with a 11-blade.  I dissected out the side branch and ligated it was 3-0 Silk and transected it.  This incision was repaired with a 4-0 Monocryl.  I turned my attention back to the anastomosis  exposure.  I irrigated out the surgical wound.  Bleeding points were controlled with electrocautery.  I applied another round of thrombin and gelfoam.  After waiting a few minutes, there was no further active bleeding.  The subcutaneous tissue was reapproximated with a running stitch of 3-0 Vicryl.  The skin was then reapproximated with a running subcuticular stitch of 4-0 Vicryl.  The skin was then cleaned, dried, and reinforced with Dermabond.  The patient tolerated this procedure well.   COMPLICATIONS: none  CONDITION: stable  Leonides Sake, MD [facility] [place] Office: [phone] Pager: [phone]  09/24/2011, 9:17 AM

## 2011-09-24 NOTE — Telephone Encounter (Addendum)
Message copied by Shari Prows on Tue Sep 24, 2011 10:00 AM ------      Message from: Melene Plan      Created: Tue Sep 24, 2011  9:53 AM                   ----- Message -----         From: Fransisco Hertz, MD         Sent: 09/24/2011   9:38 AM           To: Almetta Lovely, RN            SHAKITA KEIR      [id number]      1930/06/29            PROCEDURE:      1. Revision of left brachiocephalic arteriovenous fistula anastomosis        2. Ligation of competing side branch            Asst: Della Goo, [facility]            Follow-up: 4 weeks I scheduled an appt for pt on Friday 10/25/11 at 9am. I left message at pt's hm ph # and mailed appt letter. Jacklyn Shell

## 2011-09-24 NOTE — Anesthesia Postprocedure Evaluation (Signed)
  Anesthesia Post-op Note  Patient: Courtney [name]  Procedure(s) Performed: Procedure(s) (LRB): REVISON OF ARTERIOVENOUS FISTULA (Left) LIGATION OF COMPETING BRANCHES OF ARTERIOVENOUS FISTULA (Left)  Patient Location: PACU  Anesthesia Type: MAC  Level of Consciousness: awake  Airway and Oxygen Therapy: Patient Spontanous Breathing  Post-op Pain: mild  Post-op Assessment: Post-op Vital signs reviewed, Patient's Cardiovascular Status Stable, Respiratory Function Stable, Patent Airway, No signs of Nausea or vomiting and Pain level controlled  Post-op Vital Signs: stable  Complications: No apparent anesthesia complications

## 2011-09-24 NOTE — Preoperative (Signed)
Beta Blockers   Reason not to administer Beta Blockers:Not Applicable 

## 2011-09-24 NOTE — Discharge Instructions (Signed)
Instructions Following General Anesthetic, Adult A nurse specialized in giving anesthesia (anesthetist) or a doctor specialized in giving anesthesia (anesthesiologist) gave you a medicine that made you sleep while a procedure was performed. For as long as 24 hours following this procedure, you may feel:  Dizzy.   Weak.   Drowsy.  AFTER THE PROCEDURE After surgery, you will be taken to the recovery area where a nurse will monitor your progress. You will be allowed to go home when you are awake, stable, taking fluids well, and without complications. For the first 24 hours following an anesthetic:  Have a responsible person with you.   Do not drive a car. If you are alone, do not take public transportation.   Do not drink alcohol.   Do not take medicine that has not been prescribed by your caregiver.   Do not sign important papers or make important decisions.   You may resume normal diet and activities as directed.   Change bandages (dressings) as directed.   Only take over-the-counter or prescription medicines for pain, discomfort, or fever as directed by your caregiver.  If you have questions or problems that seem related to the anesthetic, call the hospital and ask for the anesthetist or anesthesiologist on call. SEEK IMMEDIATE MEDICAL CARE IF:   You develop a rash.   You have difficulty breathing.   You have chest pain.   You develop any allergic problems.  Document Released: 08/05/2000 Document Revised: 04/18/2011 Document Reviewed: 03/16/2007 Exit[employer] Patient Information 2012 [employer]. 

## 2011-09-24 NOTE — Anesthesia Preprocedure Evaluation (Addendum)
Anesthesia Evaluation  Patient identified by MRN, date of birth, ID band Patient awake    Reviewed: Allergy & Precautions, H&P , NPO status , Patient's Chart, lab work & pertinent test results  Airway Mallampati: I TM Distance: >3 FB Neck ROM: Full    Dental  (+) Dental Advidsory Given   Pulmonary shortness of breath,      rales    Cardiovascular hypertension, + CAD and + Past MI     Neuro/Psych CVA    GI/Hepatic   Endo/Other    Renal/GU ESRFRenal disease     Musculoskeletal   Abdominal   Peds  Hematology   Anesthesia Other Findings cachectic  Reproductive/Obstetrics Vulvar neoplasm                          Anesthesia Physical Anesthesia Plan  ASA: IV  Anesthesia Plan: MAC   Post-op Pain Management:    Induction: Intravenous  Airway Management Planned: Simple Face Mask  Additional Equipment:   Intra-op Plan:   Post-operative Plan:   Informed Consent: I have reviewed the patients History and Physical, chart, labs and discussed the procedure including the risks, benefits and alternatives for the proposed anesthesia with the patient or authorized representative who has indicated his/her understanding and acceptance.   Dental Advisory Given  Plan Discussed with: CRNA, Surgeon and Anesthesiologist  Anesthesia Plan Comments:        Anesthesia Quick Evaluation

## 2011-09-24 NOTE — Addendum Note (Signed)
Addendum  created 09/24/11 1012 by Carmela Rima, CRNA   Modules edited:Anesthesia Medication Administration

## 2011-09-24 NOTE — Progress Notes (Signed)
Rt wrist PIV removed with no complications, dry drsg placed

## 2011-09-24 NOTE — Addendum Note (Signed)
Addendum  created 09/24/11 1012 by Naydeen Speirs F Tyrique Sporn, CRNA   Modules edited:Anesthesia Medication Administration    

## 2011-10-25 ENCOUNTER — Ambulatory Visit: Payer: Medicare Other | Admitting: Vascular Surgery

## 2011-10-31 ENCOUNTER — Encounter: Payer: Self-pay | Admitting: Vascular Surgery

## 2011-11-01 ENCOUNTER — Encounter: Payer: Self-pay | Admitting: Vascular Surgery

## 2011-11-01 ENCOUNTER — Ambulatory Visit (INDEPENDENT_AMBULATORY_CARE_PROVIDER_SITE_OTHER): Payer: Medicare Other | Admitting: Vascular Surgery

## 2011-11-01 VITALS — BP 137/62 | HR 68 | Temp 98.4°F | Resp 16 | Ht 62.0 in | Wt 91.6 lb

## 2011-11-01 DIAGNOSIS — N186 End stage renal disease: Secondary | ICD-10-CM

## 2011-11-01 DIAGNOSIS — T82898A Other specified complication of vascular prosthetic devices, implants and grafts, initial encounter: Secondary | ICD-10-CM

## 2011-11-01 NOTE — Progress Notes (Signed)
[facility] [place]  Postoperative Access Visit  History of Present Illness  Courtney [name] is a 78 y.o. year old female who presents for postoperative follow-up for: revision of L BC AVF anastomosis, ligation of side branch  (Date: 09/24/11).  The patient's wounds are healed.  The patient notes no steal symptoms.  The patient is able to complete their activities of daily living.  The patient's current symptoms are: none.  HD is currently via a very aneurysmal R BC AVF.  Physical Examination  Filed Vitals:   11/01/11 1511  BP: 137/62  Pulse: 68  Temp: 98.4 F (36.9 C)  Resp: 16   LUE: Incisions are healed, skin feels warm, hand grip is 5/5, sensation in digits is intact, palpable thrill, bruit can be auscultated, fistula is obvious  Medical Decision Making  Courtney [name] is a 78 y.o. year old female who presents s/p revision of L BC AVF anastomosis, ligation of side branch .   The patient's access is ready for use.  Once the pt is successfully undergoing hemodialysis via the left [facility] AVF we can consider either ligating the R BC AVF or plicating the pseudoaneurysms in this access.  Thank you for allowing [place] to participate in this patient's care.  Leonides Sake, MD [facility] [place] Office: [phone] Pager: [phone]

## 2011-11-28 ENCOUNTER — Encounter: Payer: Self-pay | Admitting: Gynecologic Oncology

## 2011-11-28 ENCOUNTER — Ambulatory Visit: Payer: Medicare Other | Attending: Gynecologic Oncology | Admitting: Gynecologic Oncology

## 2011-11-28 VITALS — HR 60 | Temp 98.1°F | Resp 14 | Ht 61.18 in | Wt 92.8 lb

## 2011-11-28 DIAGNOSIS — Z951 Presence of aortocoronary bypass graft: Secondary | ICD-10-CM | POA: Insufficient documentation

## 2011-11-28 DIAGNOSIS — N189 Chronic kidney disease, unspecified: Secondary | ICD-10-CM | POA: Insufficient documentation

## 2011-11-28 DIAGNOSIS — I129 Hypertensive chronic kidney disease with stage 1 through stage 4 chronic kidney disease, or unspecified chronic kidney disease: Secondary | ICD-10-CM | POA: Insufficient documentation

## 2011-11-28 DIAGNOSIS — I252 Old myocardial infarction: Secondary | ICD-10-CM | POA: Insufficient documentation

## 2011-11-28 DIAGNOSIS — E785 Hyperlipidemia, unspecified: Secondary | ICD-10-CM | POA: Insufficient documentation

## 2011-11-28 DIAGNOSIS — D071 Carcinoma in situ of vulva: Secondary | ICD-10-CM | POA: Insufficient documentation

## 2011-11-28 DIAGNOSIS — I6529 Occlusion and stenosis of unspecified carotid artery: Secondary | ICD-10-CM | POA: Insufficient documentation

## 2011-11-28 DIAGNOSIS — H409 Unspecified glaucoma: Secondary | ICD-10-CM | POA: Insufficient documentation

## 2011-11-28 DIAGNOSIS — Z992 Dependence on renal dialysis: Secondary | ICD-10-CM | POA: Insufficient documentation

## 2011-11-28 NOTE — Patient Instructions (Addendum)
  F/U in 12 weeks. Will contact with the results of the Bx  Thank you very much Ms. [name] for allowing me to provide care for you today.  I appreciate your confidence in choosing our Gynecologic Oncology team.  If you have any questions about your visit today please call our office and we will get back to you as soon as possible.  Maryclare Labrador. Lorrie Gargan MD., PhD Gynecologic Oncology

## 2011-11-28 NOTE — Progress Notes (Signed)
Follow-up Note: Gyn-Onc   Courtney [name] 78 y.o. female  CC:  Chief Complaint  Patient presents with  . VIN III    Follow up    HPI:Courtney [name] is a 78 year old G1 P1 who noted the presence of a vulvar itching over the period of one year. She states that approximately 6 months ago she noted the presence of a mass on the right vulva. She states a portion of it fell off and that it's regrown in the interval period. She was evaluated by Dr. Mitzi Hansen and on 02/21/2011 a biopsy was obtained. 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   04/23/2011 she underwent wide local excision of the site of vulvar lesion an additional lesion near the buttocks. Pathology demonstrated1. Vulva, excision, lesion - SEVERE SQUAMOUS DYSPLASIA, VIN-III. MARGINS NOT INVOLVED. 2. Skin , right buttocks lesion - SEBORRHEIC KERATOSIS. NO EVIDENCE OF MALIGNANCY.   Interval History: Reports occasional periurethreal itching  Allergy:  Allergies  Allergen Reactions  . Aspirin Itching  . Pletal (Cilostazol)     dizziness    Social Hx:  Dialysis three times per week. Her daughter lives with her.  Past Surgical Hx:  Past Surgical History  Procedure Date  . Av fistula placement   . Cholecystectomy   . Coronary artery bypass graft 1997  . Vulvar lesion removal 04/23/2011    Procedure: VULVAR LESION;  Surgeon: Laurette Schimke, MD;  Location: [facility];  Service: Gynecology;  Laterality: N/A;  Wide Local Excision Of Vulvar Lesion   . Iliac artery stenting and angioplasty   . Moles      from thigh s removed.    Past Medical Hx:  Past Medical History  Diagnosis Date  . HTN (hypertension)   . Hyperparathyroidism   . Glaucoma   . Hyperlipemia   . Coronary artery disease   . Peripheral vascular disease     CAROTID ARTERY STENOSIS   . Anemia     DUE TO CHRONIC RENAL INSUFFIENCY  . Kidney disease On dialysis for 3 years    [facility]    using ffistula  in left upper arm--also has functioning  fisutla in rt upper arm--which has aneurysm and not being used--pt states blood draws from rt hand and b/p's in legs  . Vulvar lesion   . Shortness of breath     RELATED TO DIALYSIS -TOO MUCH FLUID  . Myocardial infarction   . Stroke     mini - years ago  . Arthritis     Family Hx:  Family History  Problem Relation Age of Onset  . Colon cancer Neg Hx   . Anesthesia problems Neg Hx   . Heart disease Sister     Review of Systems:  Constitutional  Feels well Genito Urinary  No frequency, urgency, dysuria, reports occasional  pruritus on the right.  Reports occasional urination.  No vulvar bleeding  Vitals:  Pulse 60, temperature 98.1 F (36.7 C), temperature source Oral, resp. rate 14, height 5' 1.18" (1.554 m), weight 92 lb 12.8 oz (42.094 kg).  Physical Exam: WD female in no acute distress in very good spirits Genito Urinary  Vulva/vagina: Normal external female genitalia.  With colposcopic examination of the vulva  a 7mm x 8mm acetowhite lesion was noted.  Area prepped anesthetized and biopsied Abdomen:  Soft NT no masses.  2cm reducible left inguinal hernia appreciated. Extremities  No bilateral cyanosis, clubbing or edema. Shunts palpable  in BUE   Assessment/Plan:  This is a 78 y.o. 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  Bx collected.  Patient declines surgical intervention if necessary.  If the biopsy demonstrates VIN  will Rx with Aldara x3/week for 12 weeks, reassessment then 4 additional weeks of Rx. The effected area was shown to the patient using a mirror.  He feels comfortable she will be able to access the area to provide treatment.  F/U in 12 weeks. Will contact with the results of the Bx  Laurette Schimke, MD., PhD. 11/28/2011, 10:04 AM

## 2011-12-04 ENCOUNTER — Telehealth: Payer: Self-pay | Admitting: Gynecologic Oncology

## 2011-12-04 ENCOUNTER — Other Ambulatory Visit: Payer: Self-pay | Admitting: Gynecologic Oncology

## 2011-12-04 DIAGNOSIS — D071 Carcinoma in situ of vulva: Secondary | ICD-10-CM

## 2011-12-04 MED ORDER — IMIQUIMOD 5 % EX CREA: TOPICAL_CREAM | CUTANEOUS | Status: AC

## 2011-12-04 NOTE — Telephone Encounter (Signed)
Spoke with patient about biopsy results and aldara cream.  Instructed to use the cream three times a week to the area biopsied on the vulva.  Instructed to wash the cream off in the am.  Reportable signs and symptoms reviewed.  Follow up appointment with Dr. Nelly Rout on Feb 20, 2012.  Instructed to call for any questions or concerns.

## 2011-12-04 NOTE — Telephone Encounter (Signed)
Message left with family member to have patient call back to the clinic to discuss aldara use and biopsy results.

## 2012-02-20 ENCOUNTER — Ambulatory Visit: Payer: Medicare Other | Admitting: Gynecologic Oncology

## 2012-02-25 ENCOUNTER — Encounter: Payer: Self-pay | Admitting: Gynecologic Oncology

## 2012-02-25 ENCOUNTER — Ambulatory Visit: Payer: Medicare Other | Attending: Gynecologic Oncology | Admitting: Gynecologic Oncology

## 2012-02-25 VITALS — HR 80 | Temp 98.3°F | Resp 16 | Ht 61.18 in | Wt 90.9 lb

## 2012-02-25 DIAGNOSIS — I10 Essential (primary) hypertension: Secondary | ICD-10-CM | POA: Insufficient documentation

## 2012-02-25 DIAGNOSIS — D071 Carcinoma in situ of vulva: Secondary | ICD-10-CM

## 2012-02-25 DIAGNOSIS — Z951 Presence of aortocoronary bypass graft: Secondary | ICD-10-CM | POA: Insufficient documentation

## 2012-02-25 DIAGNOSIS — D0739 Carcinoma in situ of other female genital organs: Secondary | ICD-10-CM | POA: Insufficient documentation

## 2012-02-25 DIAGNOSIS — H409 Unspecified glaucoma: Secondary | ICD-10-CM | POA: Insufficient documentation

## 2012-02-25 DIAGNOSIS — E213 Hyperparathyroidism, unspecified: Secondary | ICD-10-CM | POA: Insufficient documentation

## 2012-02-25 DIAGNOSIS — I2510 Atherosclerotic heart disease of native coronary artery without angina pectoris: Secondary | ICD-10-CM | POA: Insufficient documentation

## 2012-02-25 DIAGNOSIS — I739 Peripheral vascular disease, unspecified: Secondary | ICD-10-CM | POA: Insufficient documentation

## 2012-02-25 DIAGNOSIS — I252 Old myocardial infarction: Secondary | ICD-10-CM | POA: Insufficient documentation

## 2012-02-25 DIAGNOSIS — N289 Disorder of kidney and ureter, unspecified: Secondary | ICD-10-CM | POA: Insufficient documentation

## 2012-02-25 DIAGNOSIS — E785 Hyperlipidemia, unspecified: Secondary | ICD-10-CM | POA: Insufficient documentation

## 2012-02-25 DIAGNOSIS — Z992 Dependence on renal dialysis: Secondary | ICD-10-CM | POA: Insufficient documentation

## 2012-02-25 NOTE — Progress Notes (Signed)
Follow-up Note: Courtney [name]   Courtney [name] 78 y.o. female  CC:  Chief Complaint  Patient presents with  . VIN III    Follow up    HPI:[name] is a 78 year old G1 P1 who noted the presence of a vulvar itching over the period of one year. She states that approximately 6 months ago she noted the presence of a mass on the right vulva. She states a portion of it fell off and that it's regrown in the interval period. She was evaluated by Dr. Mitzi [name] and on 02/21/2011 a biopsy was obtained. 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   04/23/2011 she underwent wide local excision of the site of vulvar lesion an additional lesion near the buttocks. Pathology demonstrated1. Vulva, excision, lesion - SEVERE SQUAMOUS DYSPLASIA, VIN-III. MARGINS NOT INVOLVED. 2. Skin , right buttocks lesion - SEBORRHEIC KERATOSIS. NO EVIDENCE OF MALIGNANCY.   Interval History:  Patient seen 11/28/2011 with c/o occasional periurethreal itching.   With colposcopic examination of the vulva  a 7mm x 8mm acetowhite lesion was noted. Biopsy was c/w VIN III.  Patient declined surgical intervention if necessary. Was Rx with Aldara x3/week for 2-3 months.  Patient denies any itching.  Presents today for colposcopic examination of the vulva.     Allergy:  Allergies  Allergen Reactions  . Aspirin Itching  . Pletal (Cilostazol)     dizziness    Social Hx:  Dialysis three times per week. Her daughter lives with her.  Past Surgical Hx:  Past Surgical History  Procedure Date  . Av fistula placement   . Cholecystectomy   . Coronary artery bypass graft 1997  . Vulvar lesion removal 04/23/2011    Procedure: VULVAR LESION;  Surgeon: Courtney Schimke, MD;  Location: [facility];  Service: Gynecology;  Laterality: N/A;  Wide Local Excision Of Vulvar Lesion   . Iliac artery stenting and angioplasty   . Moles      from thigh s removed.    Past Medical Hx:  Past Medical History  Diagnosis Date    . HTN (hypertension)   . Hyperparathyroidism   . Glaucoma(365)   . Hyperlipemia   . Coronary artery disease   . Peripheral vascular disease     CAROTID ARTERY STENOSIS   . Anemia     DUE TO CHRONIC RENAL INSUFFIENCY  . 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 Vulvar lesion   . Shortness of breath     RELATED TO DIALYSIS -TOO MUCH FLUID  . Myocardial infarction   . Stroke     mini - years ago  . Arthritis     Family Hx:  Family History  Problem Relation Age of Onset  . Colon cancer Neg Hx   . Anesthesia problems Neg Hx   . Heart disease Sister     Review of Systems:  Constitutional  Feels well Genito Urinary  No frequency, urgency, dysuria,no pruritis or and the vulvar bleeding  Vitals:  Pulse 80, temperature 98.3 F (36.8 C), temperature source Oral, resp. rate 16, height 5' 1.18" (1.554 m), weight 90 lb 14.4 oz (41.232 kg).  Physical Exam: WD female in no acute distress in very good spirits Genito Urinary  Vulva/vagina: Normal external female genitalia.  With colposcopic examination of the vulva no lesions were identified.   No bilateral cyanosis, clubbing or edema.    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  Rx with Aldara x3/week for less than three mon patient's her that the standard treatment recommendation 16 weeks. At this time she wishes to proceed only with observation. I've asked her to  followup in 6 monthsths. Courtney Schimke, MD., PhD. 02/25/2012, 3:00 PM

## 2012-02-25 NOTE — Patient Instructions (Addendum)
VIN III No evidence of disease  Followup in 6 months   Thank you very much Ms. [name] for allowing me to provide care for you today.  I appreciate your confidence in choosing our Gynecologic Oncology team.  If you have any questions about your visit today please call our office and we will get back to you as soon as possible.  Maryclare Labrador. Fredonia Casalino MD., PhD Gynecologic Oncology

## 2012-06-11 ENCOUNTER — Other Ambulatory Visit: Payer: Self-pay

## 2012-06-17 ENCOUNTER — Other Ambulatory Visit (HOSPITAL_COMMUNITY): Payer: Self-pay | Admitting: Cardiology

## 2012-06-17 DIAGNOSIS — I739 Peripheral vascular disease, unspecified: Secondary | ICD-10-CM

## 2012-06-17 DIAGNOSIS — R0989 Other specified symptoms and signs involving the circulatory and respiratory systems: Secondary | ICD-10-CM

## 2012-06-18 ENCOUNTER — Encounter (HOSPITAL_COMMUNITY): Admission: RE | Payer: Self-pay | Source: Ambulatory Visit

## 2012-06-18 ENCOUNTER — Ambulatory Visit (HOSPITAL_COMMUNITY): Admission: RE | Admit: 2012-06-18 | Payer: Medicare Other | Source: Ambulatory Visit | Admitting: Surgery

## 2012-06-18 SURGERY — ASSESSMENT, SHUNT FUNCTION, WITH CONTRAST RADIOGRAPHIC STUDY
Anesthesia: LOCAL | Laterality: Left

## 2012-07-14 ENCOUNTER — Encounter (HOSPITAL_COMMUNITY): Payer: Medicare Other

## 2012-07-16 ENCOUNTER — Encounter: Payer: Self-pay | Admitting: Cardiology

## 2012-08-05 ENCOUNTER — Encounter: Payer: Self-pay | Admitting: Cardiology

## 2012-08-13 ENCOUNTER — Ambulatory Visit (HOSPITAL_COMMUNITY)
Admission: RE | Admit: 2012-08-13 | Discharge: 2012-08-13 | Disposition: A | Discharge status: Home / Self Care | Payer: Medicare Other | Source: Ambulatory Visit | Attending: Cardiology | Admitting: Cardiology

## 2012-08-13 DIAGNOSIS — I739 Peripheral vascular disease, unspecified: Secondary | ICD-10-CM | POA: Insufficient documentation

## 2012-08-13 DIAGNOSIS — R0989 Other specified symptoms and signs involving the circulatory and respiratory systems: Secondary | ICD-10-CM

## 2012-08-13 NOTE — Progress Notes (Signed)
Aorta Duplex Completed. Courtney [name] D  

## 2012-08-13 NOTE — Progress Notes (Signed)
Carotid Duplex Completed. Shanik Brookshire D  

## 2012-08-13 NOTE — Progress Notes (Signed)
Arterial Lower Ext Duplex Completed. Courtney [name]  

## 2012-11-18 ENCOUNTER — Other Ambulatory Visit: Payer: Self-pay | Admitting: *Deleted

## 2012-11-18 DIAGNOSIS — T82598A Other mechanical complication of other cardiac and vascular devices and implants, initial encounter: Secondary | ICD-10-CM

## 2012-11-23 ENCOUNTER — Encounter: Payer: Self-pay | Admitting: Vascular Surgery

## 2012-11-24 ENCOUNTER — Ambulatory Visit (INDEPENDENT_AMBULATORY_CARE_PROVIDER_SITE_OTHER): Payer: Medicare Other | Admitting: Vascular Surgery

## 2012-11-24 ENCOUNTER — Encounter: Payer: Self-pay | Admitting: Vascular Surgery

## 2012-11-24 ENCOUNTER — Encounter (INDEPENDENT_AMBULATORY_CARE_PROVIDER_SITE_OTHER): Payer: Medicare Other | Admitting: *Deleted

## 2012-11-24 VITALS — BP 185/81 | HR 60 | Temp 98.1°F | Resp 16 | Ht 62.0 in | Wt 90.6 lb

## 2012-11-24 DIAGNOSIS — T82598A Other mechanical complication of other cardiac and vascular devices and implants, initial encounter: Secondary | ICD-10-CM | POA: Insufficient documentation

## 2012-11-24 DIAGNOSIS — N186 End stage renal disease: Secondary | ICD-10-CM

## 2012-11-24 DIAGNOSIS — Z4931 Encounter for adequacy testing for hemodialysis: Secondary | ICD-10-CM

## 2012-11-24 NOTE — Progress Notes (Addendum)
[facility] [place] [facility]  Postoperative Visit hemodialysis access  CC: Pseudoaneurysms right arm AVF   Left Brachiocephalic AVF presently being used for HD HD Center: [facility] Nephrologist: Dr Arrie Aran  HPI: Courtney [name] is a 79 y.o. female who has BUE AVF both of which have good flow. She is being dialyzed through the LUE AVF which was placed approx 2 years ago.    The right Brachiocephalic AVF placed in 2001 had developed large pseudoaneurysms in it and this is no longer being used. The patient became concerned that these areas would rupture.  She states they may be a little bigger but not noticably so. She was sent for evaluation.  The patient denies symptoms of numbness, tingling, weakness and denies pain in the operative limb.   Physical Examination  Filed Vitals:   11/24/12 1441  BP: 185/81  Pulse: 60  Temp: 98.1 F (36.7 C)  Resp: 16    WDWN female in NAD.  right upper extremity and left upper extremity Incision is healed Skin color is normal   Hand grip is 5/5 and sensation in digits is intact; There is a good thrill and good bruit in the both UA AVF. There are no ulcers or discoloration of the skin over the areas of pseudoaneurysm on the right. There is one spherical area at the distal end of the fistula which is prominent but does not appear to be in imminent danger of rupture. The graft/fistula is easily palpable and of adequate size  Assessment/Plan Courtney [name] is a 79 y.o. year old who is s/p creation of right upper extremity Hemodialysis access with multiple pseudoaneurysms in the right AVF which is no longer being used. There is no ulcers or thinning of skin over these areas and the pt was reassured that these areas were in no imminent danger of rupture. Pt will return if these areas enlarge or there is any bleeding.  HD using LUE AVF at this time Clinic MD: Andres Labrum  Agree with above assessment Right upper extremity  AV fistula is not being utilized and is not appear in imminent danger of bleeding. No steel syndrome Do not see any indication to ligate the fistula at this time. He does appear to be functioning well and could potentially be utilized in the future if left upper arm fistula occludes

## 2013-06-29 ENCOUNTER — Ambulatory Visit: Payer: Medicare Other | Admitting: Cardiology

## 2013-07-17 ENCOUNTER — Other Ambulatory Visit: Payer: Self-pay | Admitting: Cardiology

## 2013-07-19 NOTE — Telephone Encounter (Signed)
Rx was sent to pharmacy electronically. 

## 2013-07-22 ENCOUNTER — Encounter: Payer: Self-pay | Admitting: Cardiology

## 2013-07-22 ENCOUNTER — Ambulatory Visit (INDEPENDENT_AMBULATORY_CARE_PROVIDER_SITE_OTHER): Payer: Medicare Other | Admitting: Cardiology

## 2013-07-22 VITALS — BP 120/0 | HR 55 | Ht 62.0 in | Wt 88.3 lb

## 2013-07-22 DIAGNOSIS — N186 End stage renal disease: Secondary | ICD-10-CM

## 2013-07-22 DIAGNOSIS — I25810 Atherosclerosis of coronary artery bypass graft(s) without angina pectoris: Secondary | ICD-10-CM

## 2013-07-22 DIAGNOSIS — I2589 Other forms of chronic ischemic heart disease: Secondary | ICD-10-CM

## 2013-07-22 DIAGNOSIS — I779 Disorder of arteries and arterioles, unspecified: Secondary | ICD-10-CM

## 2013-07-22 DIAGNOSIS — N189 Chronic kidney disease, unspecified: Secondary | ICD-10-CM

## 2013-07-22 DIAGNOSIS — I739 Peripheral vascular disease, unspecified: Secondary | ICD-10-CM

## 2013-07-22 DIAGNOSIS — E785 Hyperlipidemia, unspecified: Secondary | ICD-10-CM

## 2013-07-22 DIAGNOSIS — I129 Hypertensive chronic kidney disease with stage 1 through stage 4 chronic kidney disease, or unspecified chronic kidney disease: Secondary | ICD-10-CM

## 2013-07-22 DIAGNOSIS — I255 Ischemic cardiomyopathy: Secondary | ICD-10-CM

## 2013-07-22 DIAGNOSIS — I10 Essential (primary) hypertension: Secondary | ICD-10-CM

## 2013-07-22 NOTE — Patient Instructions (Signed)
No changes in medication  Your physician wants you to follow-up in 7812 month Dr Gar GibbonHARDING  You will receive a reminder letter in the mail two months in advance. If you don't receive a letter, please call our office to schedule the follow-up appointment.

## 2013-07-24 ENCOUNTER — Encounter: Payer: Self-pay | Admitting: Cardiology

## 2013-07-24 DIAGNOSIS — I25810 Atherosclerosis of coronary artery bypass graft(s) without angina pectoris: Secondary | ICD-10-CM | POA: Insufficient documentation

## 2013-07-24 DIAGNOSIS — I255 Ischemic cardiomyopathy: Secondary | ICD-10-CM | POA: Insufficient documentation

## 2013-07-24 DIAGNOSIS — I129 Hypertensive chronic kidney disease with stage 1 through stage 4 chronic kidney disease, or unspecified chronic kidney disease: Secondary | ICD-10-CM | POA: Insufficient documentation

## 2013-07-24 DIAGNOSIS — I779 Disorder of arteries and arterioles, unspecified: Secondary | ICD-10-CM | POA: Insufficient documentation

## 2013-07-24 DIAGNOSIS — E785 Hyperlipidemia, unspecified: Secondary | ICD-10-CM | POA: Insufficient documentation

## 2013-07-24 DIAGNOSIS — I739 Peripheral vascular disease, unspecified: Secondary | ICD-10-CM

## 2013-07-24 NOTE — Assessment & Plan Note (Addendum)
She continues to carry on her life without any major problems. No significant symptoms. She is on Plavix as well as statin and carvedilol. PCP/nephrologist stopped her Monopril. She has voiced a desire to not have noninvasive evaluation done in the absence of symptoms. We will honor that wish.

## 2013-07-24 NOTE — Assessment & Plan Note (Signed)
She is on a statin. Has been followed by her nephrologist.

## 2013-07-24 NOTE — Progress Notes (Signed)
PATIENT: Courtney [name] MRN: [id number]  DOB: 08-17-1930   DOV:07/24/2013 PCP: [name], MD  Clinic Note: Chief Complaint  Patient presents with  . Annual Exam    no chest pain ,sob, no edema, KIDNEY MD  STOPPED monopril    HPI: Courtney [name] is a 80 y.o.  female with a PMH below who presents today for annual followup of CAD, PVD and ischemic cardiomyopathy. She is because of very thin and frail appearing 80 year old African woman with a complex medical history as noted below. He is currently on hemodialysis through a left upper arm fistula, right fistula is no longer functioning is extremely ectatic and dilated up to where it is almost as big as her arm is itself..  Interval History: , Cardiology standpoint, she is very stable without any significant symptoms of chest tightness or pressure with exertion. No claudication symptoms from her PAD. She says that her legs get higher it for a while indication for doing her routine activity. She denies any heart failure symptoms his PND, orthopnea or edema, although sometimes she gets a little short of breath when she has not had dialysis over long weekend.  No chest pain or shortness of breath with rest or exertion. No PND, orthopnea or edema.  No palpitations, lightheadedness, dizziness, weakness or syncope/near syncope. No TIA/amaurosis fugax symptoms. No melena, hematochezia hematuria.   Past Medical History  Diagnosis Date  . Myocardial infarction 12-03-1995  . CAD in native artery 1997    CABG 1997, Last stress test 10/09 no ischemia  . S/P CABG x 5 1997    LIMA-LAD, SVG-OM, Seq SVG-RPDA-RPL  . Ischemic cardiomyopathy     EF by echo 11/2010 EF 35-45%, LA is moderately dilated, mod. MR, mod. to severe TR, RV systolic pressure 40-50 mmHg  . Moderate mitral regurgitation 11/2010  . Stroke, lacunar 1994  . Carotid disease, bilateral     Most recent Dopplers April 2014: Right carotid 50-69% (lower range);  lleft less than 50%.    . Hypertension, renal disease   . Dyslipidemia   . 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 Peripheral vascular disease 03/2008    Stents to rt SFA, Dopplers from April 2014: No significant change, last PV cath 03/29/08  -- fairly widely patent R. SFA stent. Left SFA less than 50%. Proximal but occluded in the mid vessel with reconstitution distally. Right PT and AP occluded as described previously an angiography. Left AP occluded also noted angiographically   . Anemia due to chronic renal failure treated with erythropoietin   . Vulvar lesion   . Osteoarthritis   . Hx of sickle cell trait   . H/O pulmonary fibrosis   . Shortness of breath     RELATED TO DIALYSIS -TOO MUCH FLUID    Prior Cardiac Evaluation and Past Surgical History: Past Surgical History  Procedure Laterality Date  . Cholecystectomy    . Coronary artery bypass graft  1997    LIMA-LAD; seq VG-PDA&PLA; VG-OM  . Vulvar lesion removal  04/23/2011    Procedure: VULVAR LESION;  Surgeon: Laurette Schimke, MD;  Location: [facility];  Service: Gynecology;  Laterality: N/A;  Wide Local Excision Of Vulvar Lesion   .  Femoral artery stent Right 03/2008    6 mm X 150 mm + 6 mm x 100 mm SMART stent;;   . Moles       from thigh s removed.  Marland Kitchen. Av fistula placement Right 2001    AVF    Allergies  Allergen Reactions  . Aspirin Itching  . Pletal [Cilostazol]     dizziness    Current Outpatient Prescriptions  Medication Sig Dispense Refill  . atorvastatin (LIPITOR) 80 MG tablet Take 80 mg by mouth daily.      . B Complex-C-Folic Acid (DIALYVITE 800 PO) Take 1 capsule by mouth daily.       . calcium acetate (PHOSLO) 667 MG capsule Take 667 mg by mouth 3 (three) times daily with meals.       . carvedilol  (COREG) 12.5 MG tablet Take 12.5 mg by mouth 2 (two) times daily with a meal.       . clopidogrel (PLAVIX) 75 MG tablet TAKE ONE TABLET BY MOUTH EVERY DAY  15 tablet  0  . polyvinyl alcohol (LIQUIFILM TEARS) 1.4 % ophthalmic solution Place 2 drops into both eyes daily as needed. For dry eyes.      . Travoprost, BAK Free, (TRAVATAN) 0.004 % SOLN ophthalmic solution Place 1 drop into both eyes at bedtime.       No current facility-administered medications for this visit.    History   Social History Narrative   Single mother of one, grandmother of one. She is not exercising as much as he used to. She used to dance and a lot of walking. But of late she has not been as much. She states that when she turned [age] she became lazy.   She does not smoke and does not drink.   Again we discussed her goals of care and desires as far as resuscitation go. She continues to state that she wants to be DO NOT RESUSCITATE DO NOT INTUBATE, but does not have the form to state that. She also is not interested in invasive evaluation unless directed by symptoms. With that in mind, she is not in favor of continued noninvasive evaluation.    ROS: A comprehensive Review of Systems - Negative except overall decreased exercise level, but no significant fatigue or dyspnea. No melena, hematochezia,or hematuria  PHYSICAL EXAM BP 120/0  Pulse 55  Ht 5\' 2"  (1.575 m)  Wt 88 lb 4.8 oz (40.053 kg)  BMI 16.15 kg/m2 General appearance: alert, cooperative, appears stated age, no distress and Quite thin and frail. Normal mood and affect. Answers questions appropriately. Neck: no adenopathy, no carotid bruit, no JVD, supple, symmetrical, trachea midline and thyroid not enlarged, symmetric, no tenderness/mass/nodules Lungs: clear to auscultation bilaterally, normal percussion bilaterally and Nonlabored, good movement. Heart: RRR, normal S1 with split S2. 1/6 HSM at apex with a 2/6 SEM at left sternal border radiating to  carotids. Abdomen: soft, non-tender; bowel sounds normal; no masses,  no organomegaly Extremities: extremities normal, atraumatic, no cyanosis or edema and no ulcers, gangrene or trophic changes Pulses: Definitely barely palpable bilateral foot pulses. Neurologic: Grossly normal Left upper arm fistula with good thrill. We dilated but not that extensive ectasia or aneurysmal dilation of the right upper February fistula.   Adult ECG Report  Rate: 58 ;  Rhythm: sinus tachycardia and sinus bradycardia  QRS Axis: 71 MS ;  PR Interval: 156 MS ;  QRS Duration: 112 MS ; QTc: 41 MS  Voltages: Borderline elevated and lateral leads  Conduction Disturbances: none  Other Abnormalities: Voltage criteria for LVH with Q. in V2 and R. in V5 based on age. Inferior T wave inversions no significant change from previous ECG ssuggestive of possible ischemia.   Narrative Interpretation: essentially stable EKG  Recent Labs: none available  ASSESSMENT / PLAN: Quite stable from a cardiac standpoint and simply persisting on dialysis. Still maintains her wish to be DO NOT RESUSCITATE. CAD (coronary artery disease) of artery bypass graft She continues to carry on her life without any major problems. No significant symptoms. She is on Plavix as well as statin and carvedilol. PCP/nephrologist stopped her Monopril. She has voiced a desire to not have noninvasive evaluation done in the absence of symptoms. We will honor that wish.  Dyslipidemia She is on a statin. Has been followed by her nephrologist.  Ischemic cardiomyopathy Despite having moderately reduced EF, she is really without any significant symptoms. As long as she is getting her dialysis routinely, she doesn't have much the way of heart failure concerns.  Peripheral vascular disease Severe, diffuse CAD with most recent Dopplers showing patent stent in the right SFA only 1 vessel runoff with occluded in the mid LF SFA and two-vessel runoff distally. No  significant change. Therefore does not need to continue following up Doppler studies in the absence of symptoms. She is on a stable cardiac regimen that will also help PAD.  Carotid disease, bilateral Moderate disease or it will less notable on the left. No real loud bruit heard. At this point of a communicating of following this up as she just does not seem interested in surgery at her age  Hypertension, renal disease This is the first time seeing her her blood pressure actually seems to be relatively normal. Unfortunately with the check blood work on the low lower legs which is not the best of options in the setting of her PAD. However based on the available time,    Orders Placed This Encounter  Procedures  . EKG 12-Lead   Meds ordered this encounter  Medications  . atorvastatin (LIPITOR) 80 MG tablet    Sig: Take 80 mg by mouth daily.    Followup: 1 year  DAVID W. Herbie Baltimore, M.D., M.S. Interventional Cardiology [facility]

## 2013-07-24 NOTE — Assessment & Plan Note (Signed)
Despite having moderately reduced EF, she is really without any significant symptoms. As long as she is getting her dialysis routinely, she doesn't have much the way of heart failure concerns.

## 2013-07-24 NOTE — Assessment & Plan Note (Signed)
Severe, diffuse CAD with most recent Dopplers showing patent stent in the right SFA only 1 vessel runoff with occluded in the mid LF SFA and two-vessel runoff distally. No significant change. Therefore does not need to continue following up Doppler studies in the absence of symptoms. She is on a stable cardiac regimen that will also help PAD.

## 2013-07-24 NOTE — Assessment & Plan Note (Signed)
This is the first time seeing her her blood pressure actually seems to be relatively normal. Unfortunately with the check blood work on the low lower legs which is not the best of options in the setting of her PAD. However based on the available time,

## 2013-07-24 NOTE — Assessment & Plan Note (Signed)
Moderate disease or it will less notable on the left. No real loud bruit heard. At this point of a communicating of following this up as she just does not seem interested in surgery at her age

## 2013-08-07 ENCOUNTER — Other Ambulatory Visit: Payer: Self-pay | Admitting: Cardiology

## 2013-08-09 NOTE — Telephone Encounter (Signed)
Rx was sent to pharmacy electronically. 

## 2013-08-10 ENCOUNTER — Ambulatory Visit: Payer: Medicare Other | Admitting: Cardiology

## 2013-08-12 ENCOUNTER — Other Ambulatory Visit (HOSPITAL_COMMUNITY): Payer: Self-pay | Admitting: Cardiology

## 2013-08-12 DIAGNOSIS — I6529 Occlusion and stenosis of unspecified carotid artery: Secondary | ICD-10-CM

## 2013-08-17 ENCOUNTER — Ambulatory Visit (HOSPITAL_COMMUNITY)
Admission: RE | Admit: 2013-08-17 | Discharge: 2013-08-17 | Disposition: A | Discharge status: Home / Self Care | Payer: Medicare Other | Source: Ambulatory Visit | Attending: Internal Medicine | Admitting: Internal Medicine

## 2013-08-17 DIAGNOSIS — I6529 Occlusion and stenosis of unspecified carotid artery: Secondary | ICD-10-CM | POA: Insufficient documentation

## 2013-08-17 NOTE — Progress Notes (Signed)
Carotid Duplex Completed. °Brianna L Mazza,RVT °

## 2013-08-19 ENCOUNTER — Telehealth: Payer: Self-pay | Admitting: *Deleted

## 2013-08-19 NOTE — Progress Notes (Signed)
Quick Note:  Carotid Doppler Results Reviewed.  There has been some slight worsening of moderate stenoses in both carotids. Not yet in the range to act upon them. Will need recheck in 6 months to ensure stability.  Marykay Lexavid W Tavis Kring, MD  ______

## 2013-08-19 NOTE — Telephone Encounter (Signed)
Spoke to patient. Result given . Verbalized understanding Recheck in 6 months

## 2013-08-19 NOTE — Telephone Encounter (Signed)
Message copied by Tobin ChadMARTIN, SHARON V. on Thu Aug 19, 2013  4:06 PM ------      Message from: Marykay LexHARDING, DAVID W      Created: Thu Aug 19, 2013  3:22 PM       Carotid Doppler Results Reviewed.            There has been some slight worsening of moderate stenoses in both carotids.  Not yet in the range to act upon them.  Will need recheck in 6 months to ensure stability.            Marykay Lexavid W Harding, MD       ------

## 2014-02-11 ENCOUNTER — Other Ambulatory Visit (HOSPITAL_COMMUNITY): Payer: Self-pay | Admitting: Cardiology

## 2014-02-11 DIAGNOSIS — I779 Disorder of arteries and arterioles, unspecified: Secondary | ICD-10-CM

## 2014-02-17 ENCOUNTER — Ambulatory Visit (HOSPITAL_COMMUNITY)
Admission: RE | Admit: 2014-02-17 | Discharge: 2014-02-17 | Disposition: A | Discharge status: Home / Self Care | Payer: Medicare Other | Source: Ambulatory Visit | Attending: Cardiology | Admitting: Cardiology

## 2014-02-17 DIAGNOSIS — I779 Disorder of arteries and arterioles, unspecified: Secondary | ICD-10-CM | POA: Diagnosis not present

## 2014-02-17 NOTE — Progress Notes (Signed)
Carotid Duplex Completed. Elmo Rio, BS, RDMS, RVT  

## 2014-02-23 ENCOUNTER — Telehealth: Payer: Self-pay | Admitting: *Deleted

## 2014-02-23 NOTE — Telephone Encounter (Signed)
Left message to call back  

## 2014-02-23 NOTE — Telephone Encounter (Signed)
Message copied by Tobin ChadMARTIN, SHARON V. on Wed Feb 23, 2014  8:24 AM ------      Message from: Marykay LexHARDING, DAVID W      Created: Tue Feb 22, 2014 12:55 PM       Stable carotid doppleer - mild stenosis on L & moderate on R, but no flow limiting stenosis.            Marykay LexHARDING,DAVID W, MD       ------

## 2014-02-24 NOTE — Telephone Encounter (Signed)
Spoke to patient. Result given . Verbalized understanding  

## 2014-04-12 ENCOUNTER — Telehealth: Payer: Self-pay | Admitting: *Deleted

## 2014-04-12 NOTE — Telephone Encounter (Signed)
FAXED CLEARANCE FOR [facility] ------PHONE [phone], FAX ----[phone] FOR CATARACT EXTRACTION W/INTRAOCULAR LENS IMPLANTATION OF THE RIGHT EYE FOLLOWED BY THE EYE. PER DR HARDING - OKAY TO HOLD PLAVIX 5 DAYS PRE-OP IF NEEDED.

## 2014-04-21 ENCOUNTER — Encounter (HOSPITAL_COMMUNITY): Payer: Self-pay | Admitting: Vascular Surgery

## 2014-07-28 ENCOUNTER — Encounter: Payer: Self-pay | Admitting: Cardiology

## 2014-07-28 ENCOUNTER — Ambulatory Visit (INDEPENDENT_AMBULATORY_CARE_PROVIDER_SITE_OTHER): Payer: Medicare Other | Admitting: Cardiology

## 2014-07-28 VITALS — HR 57 | Ht 62.0 in | Wt 84.7 lb

## 2014-07-28 DIAGNOSIS — N189 Chronic kidney disease, unspecified: Secondary | ICD-10-CM

## 2014-07-28 DIAGNOSIS — N182 Chronic kidney disease, stage 2 (mild): Secondary | ICD-10-CM

## 2014-07-28 DIAGNOSIS — N184 Chronic kidney disease, stage 4 (severe): Secondary | ICD-10-CM

## 2014-07-28 DIAGNOSIS — N183 Chronic kidney disease, stage 3 (moderate): Secondary | ICD-10-CM

## 2014-07-28 DIAGNOSIS — I255 Ischemic cardiomyopathy: Secondary | ICD-10-CM

## 2014-07-28 DIAGNOSIS — E785 Hyperlipidemia, unspecified: Secondary | ICD-10-CM

## 2014-07-28 DIAGNOSIS — N186 End stage renal disease: Secondary | ICD-10-CM

## 2014-07-28 DIAGNOSIS — I739 Peripheral vascular disease, unspecified: Secondary | ICD-10-CM

## 2014-07-28 DIAGNOSIS — I25810 Atherosclerosis of coronary artery bypass graft(s) without angina pectoris: Secondary | ICD-10-CM

## 2014-07-28 DIAGNOSIS — N185 Chronic kidney disease, stage 5: Secondary | ICD-10-CM

## 2014-07-28 DIAGNOSIS — I779 Disorder of arteries and arterioles, unspecified: Secondary | ICD-10-CM

## 2014-07-28 DIAGNOSIS — N181 Chronic kidney disease, stage 1: Secondary | ICD-10-CM

## 2014-07-28 DIAGNOSIS — I120 Hypertensive chronic kidney disease with stage 5 chronic kidney disease or end stage renal disease: Secondary | ICD-10-CM | POA: Diagnosis not present

## 2014-07-28 NOTE — Patient Instructions (Signed)
NO CHANGE INMEDICATIONS   Your physician wants you to follow-up in 3412 MONTH Dr Herbie BaltimoreHARDING.  You will receive a reminder letter in the mail two months in advance. If you don't receive a letter, please call our office to schedule the follow-up appointment.

## 2014-07-30 ENCOUNTER — Encounter: Payer: Self-pay | Admitting: Cardiology

## 2014-07-30 NOTE — Assessment & Plan Note (Signed)
Despite having reduced EF, she has no heart failure symptoms to speak of. Is euvolemic on exam. On stable medical regimen as noted.  Plan - no change.

## 2014-07-30 NOTE — Assessment & Plan Note (Signed)
Moderate disease. No significant bruit. As per above. We will not continue to evaluate.

## 2014-07-30 NOTE — Progress Notes (Signed)
PCP: [name], MD  Clinic Note: Chief Complaint  Patient presents with  . Annual Exam    pt stated she's been doing great  . Coronary Artery Disease  . PAD    HPI: Courtney [name] is a 81 y.o. female with a PMH below who presents today for annual followup of CAD, PVD and ischemic cardiomyopathy. She is because of very thin and frail appearing 81 y.o.-year-old African woman with a complex medical history as noted below. She is currently on hemodialysis through a left upper arm fistula, right fistula is no longer functioning is extremely ectatic and dilated up to where it is almost as big as her arm is itself. She had been a long-term patient of this practice, formerly a patient of Dr. [name].  She does have a long-standing history of CAD and PVD. She has end-stage renal disease on dialysis. She has made it quite clear that she would prefer not to have any further evaluations. In the absence of symptoms she doesn't want any more screening tests as he really does not see herself going through any more invasive studies. She is DO NOT RESUSCITATE/DO NOT INTUBATE.  Past Medical History  Diagnosis Date  . Myocardial infarction 12-03-1995  . CAD in native artery 1997    CABG 1997, Last stress test 10/09 no ischemia  . S/P CABG x 5 1997    LIMA-LAD, SVG-OM, Seq SVG-RPDA-RPL  . Ischemic cardiomyopathy     EF by echo 11/2010 EF 35-45%, LA is moderately dilated, mod. MR, mod. to severe TR, RV systolic pressure 40-50 mmHg  . Moderate mitral regurgitation 11/2010  . Stroke, lacunar 1994  . Carotid disease, bilateral     Most recent Dopplers April 2014: Right carotid 50-69% (lower range);  lleft less than 50%.  . Hypertension, renal disease   . Dyslipidemia   . 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 Peripheral vascular disease 03/2008    Stents to rt SFA, Dopplers from April 2014: No significant change, last PV cath 03/29/08  -- fairly widely patent R. SFA stent. Left SFA less than 50%. Proximal but occluded in the mid vessel with reconstitution distally. Right PT and AP occluded as described previously an angiography. Left AP occluded also noted angiographically   . Anemia due to chronic renal failure treated with erythropoietin   . Vulvar lesion   . Osteoarthritis   . Hx of sickle cell trait   . H/O pulmonary fibrosis   . Shortness of breath     RELATED TO DIALYSIS -TOO MUCH FLUID    Prior Cardiac Evaluation and Past Surgical History: Past Surgical History  Procedure Laterality Date  . Cholecystectomy    . Coronary artery bypass graft  1997    LIMA-LAD; seq VG-PDA&PLA; VG-OM  . Vulvar lesion removal  04/23/2011    Procedure: VULVAR LESION;  Surgeon: Laurette Schimke, MD;  Location: [facility];  Service: Gynecology;  Laterality: N/A;  Wide Local Excision Of Vulvar Lesion   . Femoral artery stent Right 03/2008    6 mm X 150 mm + 6 mm x 100 mm SMART stent;;   . Moles       from thigh s removed.  Marland Kitchen  Av fistula placement Right 2001    AVF  . Abdominal aortagram N/A 07/18/2011    Procedure: ABDOMINAL Ronny Flurry;  Surgeon: Fransisco Hertz, MD;  Location: [facility] CATH LAB;  Service: Cardiovascular;  Laterality: N/A;  . Shuntogram N/A 09/05/2011    Procedure: Betsey Amen;  Surgeon: Fransisco Hertz, MD;  Location: [facility] CATH LAB;  Service: Cardiovascular;  Laterality: N/A;   Interval History: she comes in today "feeling great", with no complaints. She denies any chest pain or shortness of breath with rest or exertion. No PND, orthopnea or edema. No palpitations, lightheadedness, dizziness, weakness or syncope/near syncope.  No TIA/amaurosis fugax symptoms. She denies any claudication symptoms. She is as active as she wants me for her age, "always on the go ".  She  doesn't do routine exercise, but is active. No arthritis symptoms no angina symptoms. Her biggest outings are usually short trips to the store. She then also has her 3 times a week dialysis visits.  ROS: A comprehensive was performed. Review of Systems  Constitutional:       Doesn't exercise as much as she used to. A little bit fatigued after dialysis.  HENT: Negative for nosebleeds.   Respiratory: Negative for cough, sputum production and wheezing.   Cardiovascular: Negative.        Per history of present illness  Gastrointestinal: Negative for blood in stool and melena.  Genitourinary: Negative for dysuria and hematuria.  Musculoskeletal: Positive for joint pain (Chronic arthritis).  Neurological: Negative for headaches.  Endo/Heme/Allergies: Does not bruise/bleed easily.  Psychiatric/Behavioral: Negative for depression and memory loss. The patient is not nervous/anxious and does not have insomnia.   All other systems reviewed and are negative.   Current Outpatient Prescriptions on File Prior to Visit  Medication Sig Dispense Refill  . atorvastatin (LIPITOR) 80 MG tablet Take 80 mg by mouth daily.    . B Complex-C-Folic Acid (DIALYVITE 800 PO) Take 1 capsule by mouth daily.     . calcium acetate (PHOSLO) 667 MG capsule Take 667 mg by mouth 3 (three) times daily with meals.     . carvedilol (COREG) 12.5 MG tablet Take 12.5 mg by mouth 2 (two) times daily with a meal.     . clopidogrel (PLAVIX) 75 MG tablet TAKE ONE TABLET BY MOUTH ONCE DAILY. 30 tablet 11  . polyvinyl alcohol (LIQUIFILM TEARS) 1.4 % ophthalmic solution Place 2 drops into both eyes daily as needed. For dry eyes.    . Travoprost, BAK Free, (TRAVATAN) 0.004 % SOLN ophthalmic solution Place 1 drop into both eyes at bedtime.     No current facility-administered medications on file prior to visit.   Allergies  Allergen Reactions  . Aspirin Itching  . Pletal [Cilostazol]     dizziness     History  Substance Use  Topics  . Smoking status: Former Smoker    Types: Cigarettes    Quit date: 11/01/1991  . Smokeless tobacco: Never Used  . Alcohol Use: No   Family History  Problem Relation Age of Onset  . Colon cancer Neg Hx   . Anesthesia problems Neg Hx   . Heart disease Sister   . Sudden death Sister 24  . Heart disease Mother   . Sudden death Mother 39  . Sudden death Father 12  . Stroke Father   . Sudden death Brother 57  . Heart disease Brother     Wt Readings from Last 3 Encounters:  07/28/14 84 lb 11.2 oz (38.42 kg)  07/22/13 88 lb 4.8 oz (40.053 kg)  11/24/12 90 lb 9.6 oz (41.096 kg)    PHYSICAL EXAM Pulse 57  Ht 5\' 2"  (1.575 m)  Wt 84 lb 11.2 oz (38.42 kg)  BMI 15.49 kg/m2 -- unable to obtain accurate BP due to Upper Extremity Fistulae -- pt unwilling to remove pants to allow for Leg check. General appearance: alert, cooperative, appears stated age, no distress and Quite thin and frail. Normal mood and affect. Answers questions appropriately. Neck: no adenopathy, no carotid bruit, no JVD, supple, symmetrical, trachea midline and thyroid not enlarged, symmetric, no tenderness/mass/nodules Lungs: clear to auscultation bilaterally, normal percussion bilaterally and Nonlabored, good movement. Heart: RRR, normal S1 with split S2. 1/6 HSM at apex with a 2/6 SEM at left sternal border radiating to carotids. Abdomen: soft, non-tender; bowel sounds normal; no masses, no organomegaly Extremities: extremities normal, atraumatic, no cyanosis or edema and no ulcers, gangrene or trophic changes Pulses: Definitely barely palpable bilateral foot pulses. Neurologic: Grossly normal Left upper arm fistula with good thrill. We dilated but not that extensive ectasia or aneurysmal dilation of the right upper February fistula.   Adult ECG Report  Rate: 57 ;  Rhythm: sinus bradycardia, premature ventricular contractions (PVC) and voltage criteria for LVH - with repolarization abnormalities, possible  LAE; normal axis & intervals   Narrative Interpretation: stable ECG  Recent Labs:  Followed by Nephrology, None available   ASSESSMENT / PLAN: Problem List Items Addressed This Visit    CAD (coronary artery disease) of artery bypass graft - Primary (Chronic)    No active anginal symptoms with her day-to-day life. No cardiac symptoms to speak of. He is stable on Plavix and BB dose. ACE inhibitor - stopped by her nephrologist prior to last visit. No further testing in the absence of symptoms.  No desire for invasive studies unless having symptoms. -- We have honored that wish.       Relevant Orders   EKG 12-Lead   Carotid disease, bilateral (Chronic)    Moderate disease. No significant bruit. As per above. We will not continue to evaluate.      Relevant Orders   EKG 12-Lead   Dyslipidemia (Chronic)    Remained stable on statin. Monitored by PCP/nephrologist      Hypertension, renal disease (Chronic)    We were not able to check her blood pressure today. Her pressures are being monitored by nephrology. We'll defer to them. She remains on stable dose of beta blocker.      Ischemic cardiomyopathy (Chronic)    Despite having reduced EF, she has no heart failure symptoms to speak of. Is euvolemic on exam. On stable medical regimen as noted.  Plan - no change.        Peripheral vascular disease (Chronic)    Moderate residual disease noted on her last Dopplers in 2014. No active claudication. She does have anterior and posterior tibial arteries occluded. No active claudication she complains of. No longer following Dopplers.      Relevant Orders   EKG 12-Lead      Orders Placed This Encounter  Procedures  . EKG 12-Lead    Followup: 1 yr    Marykay LexHARDING, Daryn Pisani W, M.D., M.S. Interventional Cardiologist   Pager # [phone]

## 2014-07-30 NOTE — Assessment & Plan Note (Signed)
We were not able to check her blood pressure today. Her pressures are being monitored by nephrology. We'll defer to them. She remains on stable dose of beta blocker.

## 2014-07-30 NOTE — Assessment & Plan Note (Signed)
Remained stable on statin. Monitored by PCP/nephrologist

## 2014-07-30 NOTE — Assessment & Plan Note (Signed)
No active anginal symptoms with her day-to-day life. No cardiac symptoms to speak of. He is stable on Plavix and BB dose. ACE inhibitor - stopped by her nephrologist prior to last visit. No further testing in the absence of symptoms.  No desire for invasive studies unless having symptoms. -- We have honored that wish.

## 2014-07-30 NOTE — Assessment & Plan Note (Addendum)
Moderate residual disease noted on her last Dopplers in 2014. No active claudication. She does have anterior and posterior tibial arteries occluded. No active claudication she complains of. No longer following Dopplers.

## 2014-11-12 ENCOUNTER — Other Ambulatory Visit: Payer: Self-pay | Admitting: Cardiology

## 2014-11-15 NOTE — Telephone Encounter (Signed)
REFILL 

## 2015-01-09 ENCOUNTER — Telehealth: Payer: Self-pay | Admitting: Cardiology

## 2015-01-09 NOTE — Telephone Encounter (Signed)
Thayer Ohm an RN called in stating that the pt had some chest pain over the weekend and her doctor [name] wanted her to be seen by the cardiologist at some point this week  Thank you

## 2015-01-09 NOTE — Telephone Encounter (Signed)
Returned call to [facility] with [facility].He stated while patient on dialysis this morning she had chest pain.He stated Dr.Powell wanted her seen this week.Patient was called she is not having chest pain at present.She stated she had slight chest pain this morning at dialysis.Advised Dr.Powell requested appointment this week.Appointment scheduled with Robbie Lis PA tomorrow 01/10/15 at 1:45 pm at [facility] office.Advised to go to ER if needed.

## 2015-01-10 ENCOUNTER — Encounter: Payer: Self-pay | Admitting: Cardiology

## 2015-01-10 ENCOUNTER — Ambulatory Visit (INDEPENDENT_AMBULATORY_CARE_PROVIDER_SITE_OTHER): Payer: Medicare Other | Admitting: Cardiology

## 2015-01-10 VITALS — BP 140/78 | HR 75 | Ht 62.0 in | Wt 84.0 lb

## 2015-01-10 DIAGNOSIS — I25810 Atherosclerosis of coronary artery bypass graft(s) without angina pectoris: Secondary | ICD-10-CM

## 2015-01-10 DIAGNOSIS — I255 Ischemic cardiomyopathy: Secondary | ICD-10-CM

## 2015-01-10 NOTE — Patient Instructions (Signed)
Medication Instructions:  -No Change  Labwork: -None  Testing/Procedures: -None  Follow-Up: Your physician wants you to follow-up in: 7 months with [name].  You will receive a reminder letter in the mail two months in advance. If you don't receive a letter, please call our office to schedule the follow-up appointment.     Any Other Special Instructions Will Be Listed Below (If Applicable).

## 2015-01-10 NOTE — Progress Notes (Signed)
01/10/2015 Courtney [name]   Oct 06, 1930  [id number]  Primary Physician [name], MD Primary Cardiologist:  [name]   Reason for Visit/CC: Atypical chest Pain during HD  HPI:  Patient is 38 African American female who presents to clinic for follow-up for CAD, peripheral vascular disease and ischemic cardiomyopathy. She was previously followed by Dr. [name] but is now followed by [name]. She does have a long-standing history of CAD. In 1997 she underwent CABG 5 with a LIMA to LAD, saphenous vein graft to the OM, sequential saphenous vein graft to the RPDA-RPL. Last echocardiogram July 2012 revealed an EF of 35-45%. She has bilateral carotid artery disease. Most recent Dopplers April 2014 showed right carotid artery stenosis in the 50-69% range. The left had less than 50% stenosis. She has also had bilateral SFA stenting in the past. She also has end-stage renal disease on hemodialysis, as well as hypertension and dyslipidemia. She has made it quite clear that she would prefer not to have any further evaluations. In the absence of symptoms she doesn't want any more screening tests as she really does not see herself going through any more invasive studies. She is DO NOT RESUSCITATE/DO NOT INTUBATE. Her lipid profiles monitored by her PCP/neurologist.  Her last office visit with [name] was 07/28/2014. The point she was felt to be fairly stable from a cardiac standpoint was instructed to follow-up with [name] in one year. However, the patient now presents back to clinic for evaluation after being referred back to our office by her nephrologist [name]. Yesterday, during a session of hemodialysis she developed slight chest discomfort. Subsequently she was scheduled to be evaluated in our office.   The patient denies any significant chest discomfort during dialysis yesterday. She notes that she, demented on slight sharp discomfort that lasted only a split second or 2. She  reports that the hemodialysis tach at very concerned and called our office to schedule an appointment. Out of hemodialysis, she denies any symptoms of chest discomfort or dyspnea. He reports that she has been fully compliant with her medications  EKG today shows sinus rhythm with left particular purchase E and repolarization abnormalities.     Current Outpatient Prescriptions  Medication Sig Dispense Refill  . atorvastatin (LIPITOR) 80 MG tablet Take 80 mg by mouth daily.    Marland Kitchen atropine 1 % ophthalmic solution Place 1 drop into the right eye 2 (two) times daily.     . B Complex-C-Folic Acid (DIALYVITE 800 PO) Take 1 capsule by mouth daily.     . calcium acetate (PHOSLO) 667 MG capsule Take 667 mg by mouth 3 (three) times daily with meals.     . carvedilol (COREG) 12.5 MG tablet Take 12.5 mg by mouth 2 (two) times daily with a meal.     . clopidogrel (PLAVIX) 75 MG tablet TAKE ONE TABLET BY MOUTH ONCE DAILY 30 tablet 11  . dorzolamide-timolol (COSOPT) 22.3-6.8 MG/ML ophthalmic solution Place 1 drop into the right eye daily.    . polyvinyl alcohol (LIQUIFILM TEARS) 1.4 % ophthalmic solution Place 2 drops into both eyes daily as needed. For dry eyes.    . Travoprost, BAK Free, (TRAVATAN) 0.004 % SOLN ophthalmic solution Place 1 drop into both eyes at bedtime.     No current facility-administered medications for this visit.    Allergies  Allergen Reactions  . Aspirin Itching  . Pletal [Cilostazol]     dizziness    Social History  Social History  . Marital Status: Single    Spouse Name: N/A  . Number of Children: 1  . Years of Education: N/A   Occupational History  . Retired-dietary     from [employer]   Social History Main Topics  . Smoking status: Former Smoker    Types: Cigarettes    Quit date: 11/01/1991  . Smokeless tobacco: Never Used  . Alcohol Use: No  . Drug Use: No  . Sexual Activity: No   Other Topics Concern  . Not on file   Social History Narrative    Single mother of one, grandmother of one. She is not exercising as much as he used to. She used to dance and a lot of walking. But of late she has not been as much. She states that when she turned 22 she became lazy.   She does not smoke and does not drink.   Again we discussed her goals of care and desires as far as resuscitation go. She continues to state that she wants to be DO NOT RESUSCITATE DO NOT INTUBATE, but does not have the form to state that. She also is not interested in invasive evaluation unless directed by symptoms. With that in mind, she is not in favor of continued noninvasive evaluation.     Review of Systems: General: negative for chills, fever, night sweats or weight changes.  Cardiovascular: negative for chest pain, dyspnea on exertion, edema, orthopnea, palpitations, paroxysmal nocturnal dyspnea or shortness of breath Dermatological: negative for rash Respiratory: negative for cough or wheezing Urologic: negative for hematuria Abdominal: negative for nausea, vomiting, diarrhea, bright red blood per rectum, melena, or hematemesis Neurologic: negative for visual changes, syncope, or dizziness All other systems reviewed and are otherwise negative except as noted above.    Blood pressure 140/78, pulse 75, height 5\' 2"  (1.575 m), weight 84 lb (38.102 kg), SpO2 88 %.  General appearance: alert, cooperative and frail appearing Neck: no JVD and + bilateral bruits Lungs: clear to auscultation bilaterally Heart: regular rate and rhythm and + gallop Extremities: bilatera fistulas in upper arms, + thrill and bruits Pulses: 2+ and symmetric Skin: warm and dry Neurologic: Grossly normal  EKG sinus rhythm with left ventricular hypertrophy and repolarization abnormalities.  ASSESSMENT AND PLAN:   1. CAD: S/p CABG 5 in 1997. Patient denies any significant chest pain or dyspnea. She reports that the slight discomfort she comment on yesterday during dialysis was atypical and very  brief. She denies any further recurrence. Her EKG today demonstrates normal sinus rhythm with repolarization abnormalities. This was reviewed also by Dr. Eldridge Dace, DOD, who also agrees that her EKG is not ischemic. He does not recommend any further workup or medication titration.  2. Peripheral vascular disease: Status post bilateral SFA stenting remotely. Denies any recent issues with claudication.  3. Bilateral carotid artery disease: Doppler studies in 2014 revealed 50-69% right RCA stenosis and less than 50% stenosis on the left. She has been fairly asymptomatic with this.  4. End-stage renal disease: On hemodialysis.  5. Hypertension: stable at 140/78. Will not adjust meds given that she gets HD 3x/week and wants to avoid hypotension  6. Hyperlipidemia: Followed by PCP  7. Ischemic cardiomyopathy: Last assessment of left ventricular systolic function by echo in 2012 revealed an ejection fraction of 35-40%. Euvolemic on exam. Volume-controlled through hemodialysis. Continue Coreg. No ACE/ARB given CKD.    PLAN  No further w/u indicated. Continue routine follow-up with [name]. Follow-up with [name]  in March 2017.  SIMMONS, BRITTAINY PA-C 01/10/2015 2:07 PM

## 2015-03-13 ENCOUNTER — Other Ambulatory Visit: Payer: Self-pay | Admitting: Cardiology

## 2015-03-13 DIAGNOSIS — I6523 Occlusion and stenosis of bilateral carotid arteries: Secondary | ICD-10-CM

## 2015-03-14 ENCOUNTER — Ambulatory Visit (HOSPITAL_COMMUNITY): Admission: RE | Admit: 2015-03-14 | Payer: Medicare Other | Source: Ambulatory Visit

## 2015-08-07 ENCOUNTER — Encounter (HOSPITAL_COMMUNITY): Payer: Self-pay | Admitting: Cardiology

## 2015-09-06 ENCOUNTER — Other Ambulatory Visit: Payer: Self-pay

## 2015-09-06 DIAGNOSIS — I770 Arteriovenous fistula, acquired: Secondary | ICD-10-CM

## 2015-09-07 ENCOUNTER — Encounter: Payer: Self-pay | Admitting: Vascular Surgery

## 2015-09-12 ENCOUNTER — Other Ambulatory Visit: Payer: Self-pay

## 2015-09-12 ENCOUNTER — Ambulatory Visit (HOSPITAL_COMMUNITY)
Admission: RE | Admit: 2015-09-12 | Discharge: 2015-09-12 | Disposition: A | Discharge status: Home / Self Care | Payer: Medicare Other | Source: Ambulatory Visit | Attending: Vascular Surgery | Admitting: Vascular Surgery

## 2015-09-12 ENCOUNTER — Ambulatory Visit (INDEPENDENT_AMBULATORY_CARE_PROVIDER_SITE_OTHER): Payer: Medicare Other | Admitting: Vascular Surgery

## 2015-09-12 VITALS — BP 146/67 | HR 62 | Temp 95.7°F | Resp 16 | Ht 62.0 in | Wt 83.0 lb

## 2015-09-12 DIAGNOSIS — I770 Arteriovenous fistula, acquired: Secondary | ICD-10-CM | POA: Diagnosis not present

## 2015-09-12 DIAGNOSIS — E785 Hyperlipidemia, unspecified: Secondary | ICD-10-CM | POA: Insufficient documentation

## 2015-09-12 DIAGNOSIS — Z992 Dependence on renal dialysis: Secondary | ICD-10-CM | POA: Diagnosis not present

## 2015-09-12 DIAGNOSIS — I120 Hypertensive chronic kidney disease with stage 5 chronic kidney disease or end stage renal disease: Secondary | ICD-10-CM | POA: Diagnosis not present

## 2015-09-12 DIAGNOSIS — N186 End stage renal disease: Secondary | ICD-10-CM | POA: Diagnosis not present

## 2015-09-12 NOTE — Progress Notes (Signed)
Subjective:     Patient ID: Courtney [name], female   DOB: January 06, 1931, 82 y.o.   MRN: [id number]  HPI this 82 year old female with end-stage renal disease is on hemodialysis Monday Wednesday and Friday. She has a functioning left upper arm brachial-cephalic AV fistula which is working well. She also has a right upper arm fistula which has an aneurysmal area which is very thin and there is concern about this. It was thought to be nonfunctional and has not been utilized in a few years. She denies any pain in either hand.  Past Medical History  Diagnosis Date  . Myocardial infarction (HCC) 12-03-1995  . CAD in native artery 1997    CABG 1997, Last stress test 10/09 no ischemia  . S/P CABG x 5 1997    LIMA-LAD, SVG-OM, Seq SVG-RPDA-RPL  . Ischemic cardiomyopathy     EF by echo 11/2010 EF 35-45%, LA is moderately dilated, mod. MR, mod. to severe TR, RV systolic pressure 40-50 mmHg  . Moderate mitral regurgitation 11/2010  . Stroke, lacunar 1994  . Carotid disease, bilateral     Most recent Dopplers April 2014: Right carotid 50-69% (lower range);  lleft less than 50%.  . Hypertension, renal disease   . Dyslipidemia   . 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 Peripheral vascular disease 03/2008    Stents to rt SFA, Dopplers from April 2014: No significant change, last PV cath 03/29/08  -- fairly widely patent R. SFA stent. Left SFA less than 50%. Proximal but occluded in the mid vessel with reconstitution distally. Right PT and AP occluded as described previously an angiography. Left AP occluded also noted angiographically   . Anemia due to chronic renal failure treated with erythropoietin   . Vulvar lesion   . Osteoarthritis   . Hx of sickle cell trait   . H/O  pulmonary fibrosis   . Shortness of breath     RELATED TO DIALYSIS -TOO MUCH FLUID    Social History  Substance Use Topics  . Smoking status: Former Smoker    Types: Cigarettes    Quit date: 11/01/1991  . Smokeless tobacco: Never Used  . Alcohol Use: No    Family History  Problem Relation Age of Onset  . Colon cancer Neg Hx   . Anesthesia problems Neg Hx   . Heart disease Sister   . Sudden death Sister 29  . Heart disease Mother   . Sudden death Mother 65  . Sudden death Father 20  . Stroke Father   . Sudden death Brother 61  . Heart disease Brother     Allergies  Allergen Reactions  . Aspirin Itching  . Pletal [Cilostazol]     dizziness     Current outpatient prescriptions:  .  atorvastatin (LIPITOR) 80 MG tablet, Take 80 mg by mouth daily., Disp: , Rfl:  .  atropine 1 % ophthalmic solution, Place 1 drop into the right eye 2 (two) times daily. , Disp: , Rfl:  .  B Complex-C-Folic Acid (DIALYVITE 800 PO), Take 1 capsule by mouth daily. , Disp: , Rfl:  .  calcium acetate (PHOSLO) 667 MG capsule, Take 667 mg by mouth 3 (three) times daily with meals. ,  Disp: , Rfl:  .  carvedilol (COREG) 12.5 MG tablet, Take 12.5 mg by mouth 2 (two) times daily with a meal. , Disp: , Rfl:  .  clopidogrel (PLAVIX) 75 MG tablet, TAKE ONE TABLET BY MOUTH ONCE DAILY, Disp: 30 tablet, Rfl: 11 .  dorzolamide-timolol (COSOPT) 22.3-6.8 MG/ML ophthalmic solution, Place 1 drop into the right eye daily., Disp: , Rfl:  .  polyvinyl alcohol (LIQUIFILM TEARS) 1.4 % ophthalmic solution, Place 2 drops into both eyes daily as needed. For dry eyes., Disp: , Rfl:  .  Travoprost, BAK Free, (TRAVATAN) 0.004 % SOLN ophthalmic solution, Place 1 drop into both eyes at bedtime., Disp: , Rfl:   Filed Vitals:   09/12/15 0837  BP: 146/67  Pulse: 62  Temp: 95.7 F (35.4 C)  Resp: 16  Height: 5\' 2"  (1.575 m)  Weight: 83 lb (37.649 kg)    Body mass index is 15.18 kg/(m^2).           Review of  Systems denies chest pain but does have mild dyspnea on exertion. Has history of ischemic cardiomyopathy, coronary artery bypass grafting, pulmonary fibrosis,. Patient has been quite stable on hemodialysis for the last few years.     Objective:   Physical Exam BP 146/67 mmHg  Pulse 62  Temp(Src) 95.7 F (35.4 C)  Resp 16  Ht 5\' 2"  (1.575 m)  Wt 83 lb (37.649 kg)  BMI 15.18 kg/m2    Gen.-alert and oriented x3 in no apparent distress-very thin  HEENT normal for age Lungs no rhonchi or wheezing Cardiovascular regular rhythm no murmurs carotid pulses 3+ palpable no bruits audible Abdomen soft nontender no palpable masses Musculoskeletal free of  major deformities Skin clear -no rashes Neurologic normal Lower extremities 3+ femoral and dorsalis pedis pulses palpable bilaterally with no edema Right upper extremity with patent brachial-cephalic AV fistula with aneurysmal segment near arterial anastomosis measuring approximate 5 x 3 cm in diameter. One area of skin is quite thin the distal aspect of the aneurysm. Does have good pulse and palpable thrill. Left upper arm brachial-cephalic AV fistula is functional with dilated area near arterial anastomosis but no focal aneurysmal area noted and no infection or thinning of the skin.  Duplex scan was ordered today of the left brachial cephalic fistula which has good flow and nothing out of the ordinary other than the dilatation of the distal half of the fistula as noted       Assessment:     Aneurysmal changes with very thin scan and right upper arm brachial-cephalic AV fistula which is no longer utilized because of nicely functioning left upper arm brachial-cephalic AV fistula History of coronary artery disease and coronary artery bypass grafting History of lacunar stroke History of pulmonary fibrosis End-stage renal disease chronic    Plan:     Plan ligation of right upper arm AV fistula under local anesthesia with mild sedation on  Thursday, May 11 to eliminate possibility of rupture Discuss this with patient and her daughter and they would like to proceed

## 2015-09-19 ENCOUNTER — Encounter: Payer: Self-pay | Admitting: Cardiology

## 2015-09-19 ENCOUNTER — Ambulatory Visit (INDEPENDENT_AMBULATORY_CARE_PROVIDER_SITE_OTHER): Payer: Medicare Other | Admitting: Cardiology

## 2015-09-19 VITALS — BP 140/60 | HR 64 | Ht 62.0 in | Wt 83.2 lb

## 2015-09-19 DIAGNOSIS — I255 Ischemic cardiomyopathy: Secondary | ICD-10-CM

## 2015-09-19 DIAGNOSIS — I739 Peripheral vascular disease, unspecified: Secondary | ICD-10-CM | POA: Diagnosis not present

## 2015-09-19 DIAGNOSIS — E785 Hyperlipidemia, unspecified: Secondary | ICD-10-CM

## 2015-09-19 DIAGNOSIS — I25810 Atherosclerosis of coronary artery bypass graft(s) without angina pectoris: Secondary | ICD-10-CM

## 2015-09-19 DIAGNOSIS — I779 Disorder of arteries and arterioles, unspecified: Secondary | ICD-10-CM

## 2015-09-19 DIAGNOSIS — I2510 Atherosclerotic heart disease of native coronary artery without angina pectoris: Secondary | ICD-10-CM

## 2015-09-19 NOTE — Patient Instructions (Signed)
No changes with current medications   Your physician wants you to follow-up in 12 months with DR HARDING.You will receive a reminder letter in the mail two months in advance. If you don't receive a letter, please call our office to schedule the follow-up appointment.   If you need a refill on your cardiac medications before your next appointment, please call your pharmacy.  

## 2015-09-19 NOTE — Progress Notes (Signed)
PCP: [name], MD  Clinic Note: Chief Complaint  Patient presents with  . Follow-up    CAD - CABG, PAD    HPI: Courtney [name] is a 82 y.o. female with a PMH below who presents today for ~9 month f/u for CAD-CABG, PVD & ICM.  Has ESRD on HD.  She has declined any ischemic evaluation as she would not want any invasive procedures. She is quite clearly DO NOT RESUSCITATE/DO NOT INTUBATE She is very thin and frail appearing 83 y.o.-year-old African woman with a complex medical history as noted below. She is currently on hemodialysis through a left upper arm fistula, right fistula is no longer functioning is extremely ectatic and dilated up to where it is almost as big as her arm is itself.  She had been a long-term patient of this practice, formerly a patient of Dr. [name].   Courtney [name] was last seen in Aug 016 by Robbie Lis, PA for CP during HD. No further w/u.  Recent Hospitalizations: none  Studies Reviewed:   None  She has a aneurysmal dilation of her right upper extremity fistula that is reportedly can be worked on with a ligation procedure.  Interval History: Courtney [name] presents today for follow-up. She really as usual denies any significant symptoms. She has not had any further episodes of chest pain with or without dialysis. She has no PND, orthopnea or edema suggest heart failure. Has not had any exertional chest pain or dyspnea. She is limited by claudication and arthritis. Despite this, she says she is always on the go and denies any resting or exertional chest tightness/pressure. No rapid irregular heartbeat/palpitations.  No syncope/near syncope. No TIA/amaurosis fugax symptoms. No claudication.  ROS: A comprehensive was performed. Review of Systems  Constitutional: Negative for malaise/fatigue.       Doesn't really exercise very much. Limited by claudication and arthritis.  HENT: Negative for congestion and nosebleeds.   Respiratory: Negative  for cough, sputum production and wheezing.   Cardiovascular: Positive for claudication. Negative for leg swelling.  Gastrointestinal: Negative for blood in stool and melena.  Genitourinary: Negative for hematuria.  Musculoskeletal: Positive for joint pain. Negative for myalgias and falls.  Neurological: Negative for dizziness and weakness.  Endo/Heme/Allergies: Does not bruise/bleed easily.  Psychiatric/Behavioral: Negative for depression and memory loss. The patient is not nervous/anxious and does not have insomnia.   All other systems reviewed and are negative.   Past Medical History  Diagnosis Date  . Myocardial infarction (HCC) 12-03-1995  . CAD in native artery 1997    CABG 1997, Last stress test 10/09 no ischemia  . S/P CABG x 5 1997    LIMA-LAD, SVG-OM, Seq SVG-RPDA-RPL  . Ischemic cardiomyopathy     EF by echo 11/2010 EF 35-45%, LA is moderately dilated, mod. MR, mod. to severe TR, RV systolic pressure 40-50 mmHg  . Moderate mitral regurgitation 11/2010  . Stroke, lacunar (HCC) 1994  . Carotid disease, bilateral (HCC)     Most recent Dopplers April 2014: Right carotid 50-69% (lower range);  lleft less than 50%.  . Hypertension, renal disease   . Dyslipidemia   . 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 Peripheral vascular disease (  HCC) 03/2008    Stents to rt SFA, Dopplers from April 2014: No significant change, last PV cath 03/29/08  -- fairly widely patent R. SFA stent. Left SFA less than 50%. Proximal but occluded in the mid vessel with reconstitution distally. Right PT and AP occluded as described previously an angiography. Left AP occluded also noted angiographically   . Anemia due to chronic renal failure treated with erythropoietin   .  Vulvar lesion   . Osteoarthritis   . Hx of sickle cell trait   . H/O pulmonary fibrosis   . Shortness of breath     RELATED TO DIALYSIS -TOO MUCH FLUID  . Alzheimer's dementia   . Hypertension     Past Surgical History  Procedure Laterality Date  . Cholecystectomy    . Coronary artery bypass graft  1997    LIMA-LAD; seq VG-PDA&PLA; VG-OM  . Vulvar lesion removal  04/23/2011    Procedure: VULVAR LESION;  Surgeon: Laurette SchimkeWendy Brewster, MD;  Location: [facility];  Service: Gynecology;  Laterality: N/A;  Wide Local Excision Of Vulvar Lesion   . Femoral artery stent Right 03/2008    6 mm X 150 mm + 6 mm x 100 mm SMART stent;;   . Moles       from thigh s removed.  Marland Kitchen. Av fistula placement Right 2001    AVF  . Abdominal aortagram N/A 07/18/2011    Procedure: ABDOMINAL Ronny FlurryAORTAGRAM;  Surgeon: Fransisco HertzBrian L Chen, MD;  [facility];  Service: Cardiovascular;  Laterality: N/A;  . Shuntogram N/A 09/05/2011    Procedure: Betsey AmenSHUNTOGRAM;  Surgeon: Fransisco HertzBrian L Chen, MD;  [facility];  Service: Cardiovascular;  Laterality: N/A;   Prior to Admission medications   Medication Sig Start Date End Date Taking? Authorizing Provider  atorvastatin (LIPITOR) 80 MG tablet Take 80 mg by mouth daily.   Yes Historical Provider, MD  B Complex-C-Folic Acid (DIALYVITE 800 PO) Take 1 capsule by mouth daily.    Yes Historical Provider, MD  calcium acetate (PHOSLO) 667 MG capsule Take 667 mg by mouth 3 (three) times daily with meals.    Yes Historical Provider, MD  carvedilol (COREG) 12.5 MG tablet Take 12.5 mg by mouth 2 (two) times daily with a meal.    Yes Historical Provider, MD  clopidogrel (PLAVIX) 75 MG tablet TAKE ONE TABLET BY MOUTH ONCE DAILY Patient taking differently: TAKE ONE TABLET (75 mg) BY MOUTH ONCE DAILY 11/15/14  Yes Marykay Lexavid W Harding, MD  Travoprost, BAK Free, (TRAVATAN) 0.004 % SOLN ophthalmic solution Place 1 drop into both eyes at bedtime.   Yes Historical Provider, MD  Artificial Tear Ointment (DRY EYES  OP) Place 1 drop into both eyes daily as needed (for dry eyes).     Historical Provider, MD  latanoprost (XALATAN) 0.005 % ophthalmic solution Place 1 drop into both eyes at bedtime.    Historical Provider, MD  oxyCODONE-acetaminophen (PERCOCET/ROXICET) 5-325 MG tablet Take 1 tablet by mouth every 6 (six) hours as needed. 09/21/15   Lars MageEmma M Collins, PA-C   Allergies  Allergen Reactions  . Aspirin Itching  . Pletal [Cilostazol]     dizziness    Social History   Social History  . Marital Status: Single    Spouse Name: N/A  . Number of Children: 1  . Years of Education: N/A   Occupational History  . Retired-dietary     from [employer]   Social History Main Topics  . Smoking status: Former Smoker  Types: Cigarettes    Quit date: 11/01/1991  . Smokeless tobacco: Never Used  . Alcohol Use: No  . Drug Use: No  . Sexual Activity: No   Other Topics Concern  . None   Social History Narrative   Single mother of one, grandmother of one. She is not exercising as much as he used to. She used to dance and a lot of walking. But of late she has not been as much. She states that when she turned 67 she became lazy.   She does not smoke and does not drink.   Again we discussed her goals of care and desires as far as resuscitation go. She continues to state that she wants to be DO NOT RESUSCITATE DO NOT INTUBATE, but does not have the form to state that. She also is not interested in invasive evaluation unless directed by symptoms. With that in mind, she is not in favor of continued noninvasive evaluation.    family history includes Heart disease in her brother, mother, and sister; Stroke in her father; Sudden death (age of onset: 62) in her sister; Sudden death (age of onset: 54) in her father; Sudden death (age of onset: 79) in her brother; Sudden death (age of onset: 60) in her mother. There is no history of Colon cancer or Anesthesia problems.   Wt Readings from Last 3 Encounters:    09/21/15 83 lb (37.649 kg)  09/19/15 83 lb 3.2 oz (37.739 kg)  09/12/15 83 lb (37.649 kg)    PHYSICAL EXAM BP 140/60 mmHg  Pulse 64  Ht 5\' 2"  (1.575 m)  Wt 83 lb 3.2 oz (37.739 kg)  BMI 15.21 kg/m2 General appearance: alert, cooperative, appears stated age, no distress and Quite thin and frail. Normal mood and affect. Answers questions appropriately. Neck: no adenopathy, no carotid bruit, no JVD, supple, symmetrical, trachea midline and thyroid not enlarged, symmetric, no tenderness/mass/nodules Lungs: clear to auscultation bilaterally, normal percussion bilaterally and Nonlabored, good movement. Heart: RRR, normal S1 with split S2. 1/6 HSM at apex with a 2/6 SEM at left sternal border radiating to carotids. Abdomen: soft, non-tender; bowel sounds normal; no masses, no organomegaly Extremities: extremities normal, atraumatic, no cyanosis or edema and no ulcers, gangrene or trophic changes Pulses: Definitely barely palpable bilateral foot pulses. Neurologic: Grossly normal Left upper arm fistula with good thrill. Dilated /ectatic R UE fisutla with small aneurysmal dilation     Adult ECG Report  Rate: 64 ;  Rhythm: normal sinus rhythm and LVH with repolarization abnormality. Likely left atrial enlargement. Otherwise normal axis, intervals and durations.;   Narrative Interpretation: Stable EKG  Other studies Reviewed: Additional studies/ records that were reviewed today include:  Recent Labs:  No recent labs. PCP follows.   ASSESSMENT / PLAN: Problem List Items Addressed This Visit    Peripheral vascular disease (HCC) (Chronic)   Relevant Orders   EKG 12-Lead (Completed)   Ischemic cardiomyopathy (Chronic)    She has a reduced EF on echo, but has no heart failure symptoms. With dialysis she maintained euvolemic.  On stable dose of carvedilol. Not requiring any additional afterload reduction.  No change      Relevant Orders   EKG 12-Lead (Completed)   Dyslipidemia -  Primary (Chronic)    Maintain on stable dose of statin. Followed by her PCP and nephrologist.      Relevant Orders   EKG 12-Lead (Completed)   Coronary artery disease involving native coronary artery without angina pectoris (Chronic)  Stable. No anginal symptoms. On carvedilol, Plavix and statin.      Carotid disease, bilateral (HCC) (Chronic)   Relevant Orders   EKG 12-Lead (Completed)   CAD (coronary artery disease) of artery bypass graft (Chronic)    Again no active anginal symptoms and a day life. She had one episode of sharp pain at dialysis that was evaluated. No more of that symptom. She has declined any stress test or echocardiogram evaluations as she would not want any invasive studies unless she is having significant symptoms. Therefore no further testing. On stable medications. ACE inhibitor no longer being used. Was stopped by her nephrologist.      Relevant Orders   EKG 12-Lead (Completed)      Current medicines are reviewed at length with the patient today. (+/- concerns) None The following changes have been made: None Studies Ordered:   Orders Placed This Encounter  Procedures  . EKG 12-Lead   Follow-up: One year   Bryan Lemma, M.D., M.S. Interventional Cardiologist   Pager # [phone] Phone # [phone] [address] [place], [place] [address]

## 2015-09-20 ENCOUNTER — Encounter (HOSPITAL_COMMUNITY): Payer: Self-pay | Admitting: *Deleted

## 2015-09-20 MED ORDER — SODIUM CHLORIDE 0.9 % IV SOLN: INTRAVENOUS | Status: DC

## 2015-09-20 MED ORDER — DEXTROSE 5 % IV SOLN
1.5000 g | INTRAVENOUS | Status: AC
  Administered 2015-09-21: 1.5 g via INTRAVENOUS
  Filled 2015-09-20: qty 1.5

## 2015-09-20 NOTE — Progress Notes (Signed)
Spoke with pt's daughter, Derl BarrowCheryl Kennedy for pre-op call. She states pt has been diagnosed with Alzheimer's. Pt has long history of CAD with CABG x 5 in the late 1990's. Daughter states they saw Dr. Herbie BaltimoreHarding yesterday and he is aware that pt is having surgery. She states pt does not complain of chest pain or sob.

## 2015-09-21 ENCOUNTER — Ambulatory Visit (HOSPITAL_COMMUNITY): Payer: Medicare Other | Admitting: Anesthesiology

## 2015-09-21 ENCOUNTER — Encounter: Payer: Self-pay | Admitting: Cardiology

## 2015-09-21 ENCOUNTER — Ambulatory Visit (HOSPITAL_COMMUNITY)
Admission: RE | Admit: 2015-09-21 | Discharge: 2015-09-21 | Disposition: A | Discharge status: Home / Self Care | Payer: Medicare Other | Source: Ambulatory Visit | Attending: Vascular Surgery | Admitting: Vascular Surgery

## 2015-09-21 ENCOUNTER — Encounter (HOSPITAL_COMMUNITY)
Admission: RE | Disposition: A | Discharge status: Home / Self Care | Payer: Self-pay | Source: Ambulatory Visit | Attending: Vascular Surgery

## 2015-09-21 ENCOUNTER — Encounter (HOSPITAL_COMMUNITY): Payer: Self-pay | Admitting: *Deleted

## 2015-09-21 DIAGNOSIS — Z79899 Other long term (current) drug therapy: Secondary | ICD-10-CM | POA: Diagnosis not present

## 2015-09-21 DIAGNOSIS — I252 Old myocardial infarction: Secondary | ICD-10-CM | POA: Insufficient documentation

## 2015-09-21 DIAGNOSIS — I120 Hypertensive chronic kidney disease with stage 5 chronic kidney disease or end stage renal disease: Secondary | ICD-10-CM | POA: Diagnosis not present

## 2015-09-21 DIAGNOSIS — I739 Peripheral vascular disease, unspecified: Secondary | ICD-10-CM | POA: Diagnosis not present

## 2015-09-21 DIAGNOSIS — I255 Ischemic cardiomyopathy: Secondary | ICD-10-CM | POA: Diagnosis not present

## 2015-09-21 DIAGNOSIS — N186 End stage renal disease: Secondary | ICD-10-CM | POA: Diagnosis not present

## 2015-09-21 DIAGNOSIS — I2510 Atherosclerotic heart disease of native coronary artery without angina pectoris: Secondary | ICD-10-CM | POA: Insufficient documentation

## 2015-09-21 DIAGNOSIS — E785 Hyperlipidemia, unspecified: Secondary | ICD-10-CM | POA: Diagnosis not present

## 2015-09-21 DIAGNOSIS — T82898A Other specified complication of vascular prosthetic devices, implants and grafts, initial encounter: Secondary | ICD-10-CM | POA: Insufficient documentation

## 2015-09-21 DIAGNOSIS — D631 Anemia in chronic kidney disease: Secondary | ICD-10-CM | POA: Insufficient documentation

## 2015-09-21 DIAGNOSIS — Z992 Dependence on renal dialysis: Secondary | ICD-10-CM | POA: Diagnosis not present

## 2015-09-21 DIAGNOSIS — Z8673 Personal history of transient ischemic attack (TIA), and cerebral infarction without residual deficits: Secondary | ICD-10-CM | POA: Diagnosis not present

## 2015-09-21 DIAGNOSIS — Y832 Surgical operation with anastomosis, bypass or graft as the cause of abnormal reaction of the patient, or of later complication, without mention of misadventure at the time of the procedure: Secondary | ICD-10-CM | POA: Insufficient documentation

## 2015-09-21 DIAGNOSIS — I729 Aneurysm of unspecified site: Secondary | ICD-10-CM | POA: Diagnosis not present

## 2015-09-21 DIAGNOSIS — Z7902 Long term (current) use of antithrombotics/antiplatelets: Secondary | ICD-10-CM | POA: Diagnosis not present

## 2015-09-21 DIAGNOSIS — Z951 Presence of aortocoronary bypass graft: Secondary | ICD-10-CM | POA: Insufficient documentation

## 2015-09-21 DIAGNOSIS — Z95820 Peripheral vascular angioplasty status with implants and grafts: Secondary | ICD-10-CM | POA: Insufficient documentation

## 2015-09-21 DIAGNOSIS — Z87891 Personal history of nicotine dependence: Secondary | ICD-10-CM | POA: Diagnosis not present

## 2015-09-21 HISTORY — DX: Alzheimer's disease, unspecified: G30.9

## 2015-09-21 HISTORY — DX: Essential (primary) hypertension: I10

## 2015-09-21 HISTORY — DX: Dementia in other diseases classified elsewhere, unspecified severity, without behavioral disturbance, psychotic disturbance, mood disturbance, and anxiety: F02.80

## 2015-09-21 HISTORY — PX: LIGATION OF ARTERIOVENOUS  FISTULA: SHX5948

## 2015-09-21 LAB — GLUCOSE, CAPILLARY
GLUCOSE-CAPILLARY: 54 mg/dL — AB (ref 65–99)
GLUCOSE-CAPILLARY: 76 mg/dL (ref 65–99)
Glucose-Capillary: 65 mg/dL (ref 65–99)

## 2015-09-21 LAB — POCT I-STAT 4, (NA,K, GLUC, HGB,HCT)
GLUCOSE: 85 mg/dL (ref 65–99)
HCT: 48 % — ABNORMAL HIGH (ref 36.0–46.0)
Hemoglobin: 16.3 g/dL — ABNORMAL HIGH (ref 12.0–15.0)
Potassium: 3.4 mmol/L — ABNORMAL LOW (ref 3.5–5.1)
Sodium: 140 mmol/L (ref 135–145)

## 2015-09-21 SURGERY — LIGATION OF ARTERIOVENOUS  FISTULA
Anesthesia: Monitor Anesthesia Care | Site: Arm Upper | Laterality: Right

## 2015-09-21 MED ORDER — PHENYLEPHRINE HCL 10 MG/ML IJ SOLN
INTRAMUSCULAR | Status: DC | PRN
  Administered 2015-09-21: 80 ug via INTRAVENOUS
  Administered 2015-09-21: 40 ug via INTRAVENOUS
  Administered 2015-09-21: 80 ug via INTRAVENOUS

## 2015-09-21 MED ORDER — LIDOCAINE HCL (CARDIAC) 20 MG/ML IV SOLN
INTRAVENOUS | Status: DC | PRN
  Administered 2015-09-21: 40 mg via INTRATRACHEAL

## 2015-09-21 MED ORDER — LIDOCAINE-EPINEPHRINE (PF) 1 %-1:200000 IJ SOLN
INTRAMUSCULAR | Status: AC
  Filled 2015-09-21: qty 30

## 2015-09-21 MED ORDER — OXYCODONE HCL 5 MG/5ML PO SOLN: 5.0000 mg | Freq: Once | ORAL | Status: DC | PRN

## 2015-09-21 MED ORDER — PROPOFOL 10 MG/ML IV BOLUS
INTRAVENOUS | Status: AC
  Filled 2015-09-21: qty 20

## 2015-09-21 MED ORDER — LIDOCAINE 2% (20 MG/ML) 5 ML SYRINGE
INTRAMUSCULAR | Status: AC
  Filled 2015-09-21: qty 5

## 2015-09-21 MED ORDER — FENTANYL CITRATE (PF) 100 MCG/2ML IJ SOLN
INTRAMUSCULAR | Status: DC | PRN
  Administered 2015-09-21 (×2): 50 ug via INTRAVENOUS

## 2015-09-21 MED ORDER — ONDANSETRON HCL 4 MG/2ML IJ SOLN
INTRAMUSCULAR | Status: AC
  Filled 2015-09-21: qty 2

## 2015-09-21 MED ORDER — SODIUM CHLORIDE 0.9 % IV SOLN
INTRAVENOUS | Status: DC
  Administered 2015-09-21: 10:00:00 via INTRAVENOUS

## 2015-09-21 MED ORDER — FENTANYL CITRATE (PF) 250 MCG/5ML IJ SOLN
INTRAMUSCULAR | Status: AC
  Filled 2015-09-21: qty 5

## 2015-09-21 MED ORDER — PROPOFOL 10 MG/ML IV BOLUS
INTRAVENOUS | Status: DC | PRN
  Administered 2015-09-21: 20 mg via INTRAVENOUS

## 2015-09-21 MED ORDER — FENTANYL CITRATE (PF) 100 MCG/2ML IJ SOLN: 25.0000 ug | INTRAMUSCULAR | Status: DC | PRN

## 2015-09-21 MED ORDER — LIDOCAINE HCL (PF) 1 % IJ SOLN
INTRAMUSCULAR | Status: AC
  Filled 2015-09-21: qty 30

## 2015-09-21 MED ORDER — ONDANSETRON HCL 4 MG/2ML IJ SOLN: 4.0000 mg | Freq: Four times a day (QID) | INTRAMUSCULAR | Status: DC | PRN

## 2015-09-21 MED ORDER — CHLORHEXIDINE GLUCONATE CLOTH 2 % EX PADS: 6.0000 | MEDICATED_PAD | Freq: Once | CUTANEOUS | Status: DC

## 2015-09-21 MED ORDER — OXYCODONE HCL 5 MG PO TABS: 5.0000 mg | ORAL_TABLET | Freq: Once | ORAL | Status: DC | PRN

## 2015-09-21 MED ORDER — ROCURONIUM BROMIDE 50 MG/5ML IV SOLN
INTRAVENOUS | Status: AC
  Filled 2015-09-21: qty 1

## 2015-09-21 MED ORDER — 0.9 % SODIUM CHLORIDE (POUR BTL) OPTIME
TOPICAL | Status: DC | PRN
  Administered 2015-09-21: 1000 mL

## 2015-09-21 MED ORDER — LIDOCAINE-EPINEPHRINE (PF) 1 %-1:200000 IJ SOLN
INTRAMUSCULAR | Status: DC | PRN
  Administered 2015-09-21: 10 mL

## 2015-09-21 MED ORDER — OXYCODONE-ACETAMINOPHEN 5-325 MG PO TABS: 1.0000 | ORAL_TABLET | Freq: Four times a day (QID) | ORAL | Status: DC | PRN

## 2015-09-21 SURGICAL SUPPLY — 36 items
BANDAGE ACE 4X5 VEL STRL LF (GAUZE/BANDAGES/DRESSINGS) ×4 IMPLANT
BNDG GAUZE ELAST 4 BULKY (GAUZE/BANDAGES/DRESSINGS) ×2 IMPLANT
CANISTER SUCTION 2500CC (MISCELLANEOUS) ×3 IMPLANT
CLIP TI MEDIUM 6 (CLIP) IMPLANT
CLIP TI WIDE RED SMALL 6 (CLIP) ×2 IMPLANT
ELECT REM PT RETURN 9FT ADLT (ELECTROSURGICAL) ×3
ELECTRODE REM PT RTRN 9FT ADLT (ELECTROSURGICAL) ×1 IMPLANT
GAUZE SPONGE 4X4 12PLY STRL (GAUZE/BANDAGES/DRESSINGS) ×3 IMPLANT
GEL ULTRASOUND 20GR AQUASONIC (MISCELLANEOUS) IMPLANT
GLOVE BIO SURGEON STRL SZ 6.5 (GLOVE) ×1 IMPLANT
GLOVE BIO SURGEON STRL SZ7 (GLOVE) ×2 IMPLANT
GLOVE BIO SURGEONS STRL SZ 6.5 (GLOVE) ×1
GLOVE BIOGEL PI IND STRL 6.5 (GLOVE) IMPLANT
GLOVE BIOGEL PI IND STRL 7.5 (GLOVE) IMPLANT
GLOVE BIOGEL PI INDICATOR 6.5 (GLOVE) ×4
GLOVE BIOGEL PI INDICATOR 7.5 (GLOVE) ×2
GLOVE ECLIPSE 7.0 STRL STRAW (GLOVE) ×2 IMPLANT
GLOVE SS BIOGEL STRL SZ 7 (GLOVE) ×1 IMPLANT
GLOVE SUPERSENSE BIOGEL SZ 7 (GLOVE) ×2
GOWN STRL REUS W/ TWL LRG LVL3 (GOWN DISPOSABLE) ×3 IMPLANT
GOWN STRL REUS W/TWL LRG LVL3 (GOWN DISPOSABLE) ×12
KIT BASIN OR (CUSTOM PROCEDURE TRAY) ×3 IMPLANT
KIT ROOM TURNOVER OR (KITS) ×3 IMPLANT
LIQUID BAND (GAUZE/BANDAGES/DRESSINGS) ×3 IMPLANT
NS IRRIG 1000ML POUR BTL (IV SOLUTION) ×3 IMPLANT
PACK CV ACCESS (CUSTOM PROCEDURE TRAY) ×3 IMPLANT
PAD ARMBOARD 7.5X6 YLW CONV (MISCELLANEOUS) ×6 IMPLANT
SPONGE GAUZE 4X4 12PLY STER LF (GAUZE/BANDAGES/DRESSINGS) ×2 IMPLANT
SUT PROLENE 6 0 BV (SUTURE) ×2 IMPLANT
SUT SILK 0 TIES 10X30 (SUTURE) ×3 IMPLANT
SUT VIC AB 3-0 SH 27 (SUTURE) ×3
SUT VIC AB 3-0 SH 27X BRD (SUTURE) ×1 IMPLANT
SWAB COLLECTION DEVICE MRSA (MISCELLANEOUS) IMPLANT
TUBE ANAEROBIC SPECIMEN COL (MISCELLANEOUS) IMPLANT
UNDERPAD 30X30 INCONTINENT (UNDERPADS AND DIAPERS) ×3 IMPLANT
WATER STERILE IRR 1000ML POUR (IV SOLUTION) ×3 IMPLANT

## 2015-09-21 NOTE — H&P (View-Only) (Signed)
Subjective:     Patient ID: Courtney [name], female   DOB: 05/26/1930, 82 y.o.   MRN: [id number]  HPI this 82-year-old female with end-stage renal disease is on hemodialysis Monday Wednesday and Friday. She has a functioning left upper arm brachial-cephalic AV fistula which is working well. She also has a right upper arm fistula which has an aneurysmal area which is very thin and there is concern about this. It was thought to be nonfunctional and has not been utilized in a few years. She denies any pain in either hand.  Past Medical History  Diagnosis Date  . Myocardial infarction (HCC) 12-03-1995  . CAD in native artery 1997    CABG 1997, Last stress test 10/09 no ischemia  . S/P CABG x 5 1997    LIMA-LAD, SVG-OM, Seq SVG-RPDA-RPL  . Ischemic cardiomyopathy     EF by echo 11/2010 EF 35-45%, LA is moderately dilated, mod. MR, mod. to severe TR, RV systolic pressure 40-50 mmHg  . Moderate mitral regurgitation 11/2010  . Stroke, lacunar 1994  . Carotid disease, bilateral     Most recent Dopplers April 2014: Right carotid 50-69% (lower range);  lleft less than 50%.  . Hypertension, renal disease   . Dyslipidemia   . 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 Peripheral vascular disease 03/2008    Stents to rt SFA, Dopplers from April 2014: No significant change, last PV cath 03/29/08  -- fairly widely patent R. SFA stent. Left SFA less than 50%. Proximal but occluded in the mid vessel with reconstitution distally. Right PT and AP occluded as described previously an angiography. Left AP occluded also noted angiographically   . Anemia due to chronic renal failure treated with erythropoietin   . Vulvar lesion   . Osteoarthritis   . Hx of sickle cell trait   . H/O  pulmonary fibrosis   . Shortness of breath     RELATED TO DIALYSIS -TOO MUCH FLUID    Social History  Substance Use Topics  . Smoking status: Former Smoker    Types: Cigarettes    Quit date: 11/01/1991  . Smokeless tobacco: Never Used  . Alcohol Use: No    Family History  Problem Relation Age of Onset  . Colon cancer Neg Hx   . Anesthesia problems Neg Hx   . Heart disease Sister   . Sudden death Sister 42  . Heart disease Mother   . Sudden death Mother 57  . Sudden death Father 48  . Stroke Father   . Sudden death Brother 49  . Heart disease Brother     Allergies  Allergen Reactions  . Aspirin Itching  . Pletal [Cilostazol]     dizziness     Current outpatient prescriptions:  .  atorvastatin (LIPITOR) 80 MG tablet, Take 80 mg by mouth daily., Disp: , Rfl:  .  atropine 1 % ophthalmic solution, Place 1 drop into the right eye 2 (two) times daily. , Disp: , Rfl:  .  B Complex-C-Folic Acid (DIALYVITE 800 PO), Take 1 capsule by mouth daily. , Disp: , Rfl:  .  calcium acetate (PHOSLO) 667 MG capsule, Take 667 mg by mouth 3 (three) times daily with meals. ,   Disp: , Rfl:  .  carvedilol (COREG) 12.5 MG tablet, Take 12.5 mg by mouth 2 (two) times daily with a meal. , Disp: , Rfl:  .  clopidogrel (PLAVIX) 75 MG tablet, TAKE ONE TABLET BY MOUTH ONCE DAILY, Disp: 30 tablet, Rfl: 11 .  dorzolamide-timolol (COSOPT) 22.3-6.8 MG/ML ophthalmic solution, Place 1 drop into the right eye daily., Disp: , Rfl:  .  polyvinyl alcohol (LIQUIFILM TEARS) 1.4 % ophthalmic solution, Place 2 drops into both eyes daily as needed. For dry eyes., Disp: , Rfl:  .  Travoprost, BAK Free, (TRAVATAN) 0.004 % SOLN ophthalmic solution, Place 1 drop into both eyes at bedtime., Disp: , Rfl:   Filed Vitals:   09/12/15 0837  BP: 146/67  Pulse: 62  Temp: 95.7 F (35.4 C)  Resp: 16  Height: 5\' 2"  (1.575 m)  Weight: 83 lb (37.649 kg)    Body mass index is 15.18 kg/(m^2).           Review of  Systems denies chest pain but does have mild dyspnea on exertion. Has history of ischemic cardiomyopathy, coronary artery bypass grafting, pulmonary fibrosis,. Patient has been quite stable on hemodialysis for the last few years.     Objective:   Physical Exam BP 146/67 mmHg  Pulse 62  Temp(Src) 95.7 F (35.4 C)  Resp 16  Ht 5\' 2"  (1.575 m)  Wt 83 lb (37.649 kg)  BMI 15.18 kg/m2    Gen.-alert and oriented x3 in no apparent distress-very thin  HEENT normal for age Lungs no rhonchi or wheezing Cardiovascular regular rhythm no murmurs carotid pulses 3+ palpable no bruits audible Abdomen soft nontender no palpable masses Musculoskeletal free of  major deformities Skin clear -no rashes Neurologic normal Lower extremities 3+ femoral and dorsalis pedis pulses palpable bilaterally with no edema Right upper extremity with patent brachial-cephalic AV fistula with aneurysmal segment near arterial anastomosis measuring approximate 5 x 3 cm in diameter. One area of skin is quite thin the distal aspect of the aneurysm. Does have good pulse and palpable thrill. Left upper arm brachial-cephalic AV fistula is functional with dilated area near arterial anastomosis but no focal aneurysmal area noted and no infection or thinning of the skin.  Duplex scan was ordered today of the left brachial cephalic fistula which has good flow and nothing out of the ordinary other than the dilatation of the distal half of the fistula as noted       Assessment:     Aneurysmal changes with very thin scan and right upper arm brachial-cephalic AV fistula which is no longer utilized because of nicely functioning left upper arm brachial-cephalic AV fistula History of coronary artery disease and coronary artery bypass grafting History of lacunar stroke History of pulmonary fibrosis End-stage renal disease chronic    Plan:     Plan ligation of right upper arm AV fistula under local anesthesia with mild sedation on  Thursday, May 11 to eliminate possibility of rupture Discuss this with patient and her daughter and they would like to proceed

## 2015-09-21 NOTE — Transfer of Care (Signed)
Immediate Anesthesia Transfer of Care Note  Patient: Courtney [name]  Procedure(s) Performed: Procedure(s): LIGATION OF RIGHT UPPER ARM ARTERIOVENOUS  FISTULA (Right)  Patient Location: PACU  Anesthesia Type:MAC  Level of Consciousness: awake, alert , oriented and patient cooperative  Airway & Oxygen Therapy: Patient Spontanous Breathing  Post-op Assessment: Report given to RN, Post -op Vital signs reviewed and stable and Patient moving all extremities  Post vital signs: Reviewed and stable  Last Vitals:  Filed Vitals:   09/21/15 0817 09/21/15 1055  BP: 145/36 74/34  Pulse:    Temp:  36.7 C  Resp:  15    Last Pain: There were no vitals filed for this visit.    Patients Stated Pain Goal: 8 (09/21/15 0841)  Complications: No apparent anesthesia complications

## 2015-09-21 NOTE — Interval H&P Note (Signed)
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 After consideration of risks, benefits and other options for treatment, the patient has consented to  Procedure(s): LIGATION OF ARTERIOVENOUS  FISTULA (Right) as a surgical intervention .  The patient's history has been reviewed, patient examined, no change in status, stable for surgery.  I have reviewed the patient's chart and labs.  Questions were answered to the patient's satisfaction.     Josephina GipLawson, Lorree Millar

## 2015-09-21 NOTE — Progress Notes (Signed)
Pt eating and drinking. Told by Zella Ballobin RN that [facility] is okay with pt going home.

## 2015-09-21 NOTE — Assessment & Plan Note (Signed)
Stable. No anginal symptoms. On carvedilol, Plavix and statin.

## 2015-09-21 NOTE — Anesthesia Preprocedure Evaluation (Signed)
Anesthesia Evaluation  Patient identified by MRN, date of birth, ID band Patient awake    Reviewed: Allergy & Precautions, NPO status , Patient's Chart, lab work & pertinent test results  Airway Mallampati: II   Neck ROM: full    Dental   Pulmonary shortness of breath, former smoker,  Pulmonary fibrosis.   breath sounds clear to auscultation       Cardiovascular hypertension, + CAD, + Past MI, + CABG and + Peripheral Vascular Disease   Rhythm:regular Rate:Normal  EF 35-45%   Neuro/Psych Alzheimer's dementia    GI/Hepatic   Endo/Other    Renal/GU ESRF and DialysisRenal disease     Musculoskeletal  (+) Arthritis ,   Abdominal   Peds  Hematology  (+) Blood dyscrasia, Sickle cell trait ,   Anesthesia Other Findings   Reproductive/Obstetrics                             Anesthesia Physical Anesthesia Plan  ASA: IV  Anesthesia Plan: MAC   Post-op Pain Management:    Induction: Intravenous  Airway Management Planned: Simple Face Mask  Additional Equipment:   Intra-op Plan:   Post-operative Plan:   Informed Consent: I have reviewed the patients History and Physical, chart, labs and discussed the procedure including the risks, benefits and alternatives for the proposed anesthesia with the patient or authorized representative who has indicated his/her understanding and acceptance.     Plan Discussed with: CRNA, Anesthesiologist and Surgeon  Anesthesia Plan Comments:         Anesthesia Quick Evaluation

## 2015-09-21 NOTE — Assessment & Plan Note (Signed)
She has a reduced EF on echo, but has no heart failure symptoms. With dialysis she maintained euvolemic.  On stable dose of carvedilol. Not requiring any additional afterload reduction.  No change

## 2015-09-21 NOTE — Addendum Note (Signed)
Addendum  created 09/21/15 1510 by Andree ElkMichael L Leston Schueller, CRNA   Modules edited: Anesthesia Events, Narrator   Narrator:  Narrator: Event Log Edited

## 2015-09-21 NOTE — Anesthesia Postprocedure Evaluation (Signed)
Anesthesia Post Note  Patient: Courtney [name]  Procedure(s) Performed: Procedure(s) (LRB): LIGATION OF RIGHT UPPER ARM ARTERIOVENOUS  FISTULA (Right)  Patient location during evaluation: PACU Anesthesia Type: MAC Level of consciousness: awake and alert Pain management: pain level controlled Vital Signs Assessment: post-procedure vital signs reviewed and stable Respiratory status: spontaneous breathing, nonlabored ventilation, respiratory function stable and patient connected to nasal cannula oxygen Cardiovascular status: stable and blood pressure returned to baseline Anesthetic complications: no    Last Vitals:  Filed Vitals:   09/21/15 1110 09/21/15 1115  BP: 109/89   Pulse: 93 56  Temp:  36.7 C  Resp: 17 14    Last Pain: There were no vitals filed for this visit.               Christal Lagerstrom S

## 2015-09-21 NOTE — Assessment & Plan Note (Signed)
Maintain on stable dose of statin. Followed by her PCP and nephrologist.

## 2015-09-21 NOTE — Assessment & Plan Note (Signed)
Again no active anginal symptoms and a day life. She had one episode of sharp pain at dialysis that was evaluated. No more of that symptom. She has declined any stress test or echocardiogram evaluations as she would not want any invasive studies unless she is having significant symptoms. Therefore no further testing. On stable medications. ACE inhibitor no longer being used. Was stopped by her nephrologist.

## 2015-09-21 NOTE — Progress Notes (Addendum)
Spoke with Dr.Lawson about IV access,he said to try to use hand on left side. Attempted to start  Iv times 2 without success. Charge CRNA called Christain Sacramento(Elena),she will come try or send someone else.  Notified Dr. Hart RochesterLawson of  BP this morning,pt did not take Beta Blocker this morning,he is okay with  BP.

## 2015-09-21 NOTE — Op Note (Signed)
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 After infiltration forms and Xylocaine with epinephrine short longitudinal incision was made over the brachial artery to cephalic vein anastomosis in the distal upper arm. The fistula was diffusely aneurysmal with a few areas which were quite thin and focal with concern over this fistula rupturing. Therefore the vein was encircled with 2-0 silk ties and ligated flush with the brachial artery being careful not to impede flow into the distal brachial artery. There was a palpable radial pulse after this maneuver. It was doubly ligated and then opened Metzenbaum scissors. The blood within the aneurysmal sac of the fistula was evacuated and an following this the vein was ligated proximal to the opening. This very satisfactorily evacuated in the aneurysm sacs and the upper arm was wrapped with an Ace to hopefully avoid the aneurysm sacs developing a large volume of blood to thrombosed. Adequate hemostasis was achieved wound was closed in layers with Vicryl subcuticular fashion with Dermabond the arm was then dressed with a 4 x 4's Kerlix and an Ace wrap from the wrist to the axilla patient taken to recovery room in satisfactory condition   Josephina GipJames Deetta Siegmann, MD 09/21/2015 10:53 AM

## 2015-09-22 ENCOUNTER — Telehealth: Payer: Self-pay | Admitting: Vascular Surgery

## 2015-09-22 ENCOUNTER — Encounter (HOSPITAL_COMMUNITY): Payer: Self-pay | Admitting: Vascular Surgery

## 2015-09-22 ENCOUNTER — Telehealth: Payer: Self-pay | Admitting: Cardiology

## 2015-09-22 MED ORDER — CARVEDILOL 12.5 MG PO TABS: 12.5000 mg | ORAL_TABLET | Freq: Two times a day (BID) | ORAL | Status: AC

## 2015-09-22 MED ORDER — ATORVASTATIN CALCIUM 80 MG PO TABS: 80.0000 mg | ORAL_TABLET | Freq: Every day | ORAL | Status: DC

## 2015-09-22 NOTE — Telephone Encounter (Signed)
sched appt 5/23 at 8:30. Lm on hm# to inform pt of appt.

## 2015-09-22 NOTE — Telephone Encounter (Signed)
-----   Message from Phillips Odorarol S Pullins, RN sent at 09/21/2015 11:56 AM EDT ----- Regarding: needs 2 wk. f/u with [facility]   ----- Message -----    From: Lars MageEmma M Collins, PA-C    Sent: 09/21/2015  10:49 AM      To: [facility] Charge Pool  S/P ligation right upper arm av fistula f/u with Dr. Hart RochesterLawson in 2 weeks

## 2015-09-22 NOTE — Telephone Encounter (Signed)
New Message  Pt dtr calling to speak w/ RN concerning her Coreg and Lipitor- wanted to know if Dr Herbie BaltimoreHarding is theMD prescribing these meds and if pt should still be taking the meds. Please call back and discuss.

## 2015-09-22 NOTE — Telephone Encounter (Signed)
Returned daughter's call and advised her yes, Dr. Herbie BaltimoreHarding would be appropriate physician to request refill from and that I would be happy to fill these for her.  [facility] her [facility] for 90 day dispense w refills.

## 2015-09-26 ENCOUNTER — Encounter: Payer: Self-pay | Admitting: Vascular Surgery

## 2015-09-30 ENCOUNTER — Encounter (HOSPITAL_COMMUNITY): Payer: Self-pay | Admitting: Emergency Medicine

## 2015-09-30 ENCOUNTER — Emergency Department (HOSPITAL_COMMUNITY): Payer: Medicare Other

## 2015-09-30 ENCOUNTER — Inpatient Hospital Stay (HOSPITAL_COMMUNITY)
Admission: EM | Admit: 2015-09-30 | Discharge: 2015-10-03 | DRG: 480 | Disposition: A | Discharge status: Skilled Nursing Facility | Payer: Medicare Other | Attending: Internal Medicine | Admitting: Internal Medicine

## 2015-09-30 DIAGNOSIS — E785 Hyperlipidemia, unspecified: Secondary | ICD-10-CM | POA: Diagnosis present

## 2015-09-30 DIAGNOSIS — Z7902 Long term (current) use of antithrombotics/antiplatelets: Secondary | ICD-10-CM

## 2015-09-30 DIAGNOSIS — I255 Ischemic cardiomyopathy: Secondary | ICD-10-CM | POA: Diagnosis present

## 2015-09-30 DIAGNOSIS — I120 Hypertensive chronic kidney disease with stage 5 chronic kidney disease or end stage renal disease: Secondary | ICD-10-CM | POA: Diagnosis present

## 2015-09-30 DIAGNOSIS — I959 Hypotension, unspecified: Secondary | ICD-10-CM | POA: Diagnosis not present

## 2015-09-30 DIAGNOSIS — F0280 Dementia in other diseases classified elsewhere without behavioral disturbance: Secondary | ICD-10-CM | POA: Diagnosis present

## 2015-09-30 DIAGNOSIS — N2581 Secondary hyperparathyroidism of renal origin: Secondary | ICD-10-CM | POA: Diagnosis present

## 2015-09-30 DIAGNOSIS — S72002S Fracture of unspecified part of neck of left femur, sequela: Secondary | ICD-10-CM | POA: Diagnosis not present

## 2015-09-30 DIAGNOSIS — Z66 Do not resuscitate: Secondary | ICD-10-CM | POA: Diagnosis present

## 2015-09-30 DIAGNOSIS — I252 Old myocardial infarction: Secondary | ICD-10-CM | POA: Diagnosis not present

## 2015-09-30 DIAGNOSIS — S72009A Fracture of unspecified part of neck of unspecified femur, initial encounter for closed fracture: Secondary | ICD-10-CM | POA: Diagnosis present

## 2015-09-30 DIAGNOSIS — N186 End stage renal disease: Secondary | ICD-10-CM | POA: Diagnosis present

## 2015-09-30 DIAGNOSIS — J8410 Pulmonary fibrosis, unspecified: Secondary | ICD-10-CM | POA: Diagnosis present

## 2015-09-30 DIAGNOSIS — Z8673 Personal history of transient ischemic attack (TIA), and cerebral infarction without residual deficits: Secondary | ICD-10-CM

## 2015-09-30 DIAGNOSIS — I10 Essential (primary) hypertension: Secondary | ICD-10-CM | POA: Diagnosis not present

## 2015-09-30 DIAGNOSIS — D62 Acute posthemorrhagic anemia: Secondary | ICD-10-CM | POA: Diagnosis not present

## 2015-09-30 DIAGNOSIS — M1990 Unspecified osteoarthritis, unspecified site: Secondary | ICD-10-CM | POA: Diagnosis present

## 2015-09-30 DIAGNOSIS — Z992 Dependence on renal dialysis: Secondary | ICD-10-CM

## 2015-09-30 DIAGNOSIS — M25552 Pain in left hip: Secondary | ICD-10-CM | POA: Diagnosis present

## 2015-09-30 DIAGNOSIS — R410 Disorientation, unspecified: Secondary | ICD-10-CM | POA: Diagnosis not present

## 2015-09-30 DIAGNOSIS — R001 Bradycardia, unspecified: Secondary | ICD-10-CM | POA: Diagnosis present

## 2015-09-30 DIAGNOSIS — G309 Alzheimer's disease, unspecified: Secondary | ICD-10-CM | POA: Diagnosis present

## 2015-09-30 DIAGNOSIS — E876 Hypokalemia: Secondary | ICD-10-CM | POA: Diagnosis present

## 2015-09-30 DIAGNOSIS — Y92009 Unspecified place in unspecified non-institutional (private) residence as the place of occurrence of the external cause: Secondary | ICD-10-CM | POA: Diagnosis not present

## 2015-09-30 DIAGNOSIS — I2510 Atherosclerotic heart disease of native coronary artery without angina pectoris: Secondary | ICD-10-CM | POA: Diagnosis present

## 2015-09-30 DIAGNOSIS — W010XXA Fall on same level from slipping, tripping and stumbling without subsequent striking against object, initial encounter: Secondary | ICD-10-CM | POA: Diagnosis present

## 2015-09-30 DIAGNOSIS — Z951 Presence of aortocoronary bypass graft: Secondary | ICD-10-CM

## 2015-09-30 DIAGNOSIS — S72002A Fracture of unspecified part of neck of left femur, initial encounter for closed fracture: Secondary | ICD-10-CM

## 2015-09-30 DIAGNOSIS — I340 Nonrheumatic mitral (valve) insufficiency: Secondary | ICD-10-CM | POA: Diagnosis present

## 2015-09-30 DIAGNOSIS — S72001S Fracture of unspecified part of neck of right femur, sequela: Secondary | ICD-10-CM | POA: Diagnosis not present

## 2015-09-30 DIAGNOSIS — I739 Peripheral vascular disease, unspecified: Secondary | ICD-10-CM | POA: Diagnosis present

## 2015-09-30 DIAGNOSIS — S72142A Displaced intertrochanteric fracture of left femur, initial encounter for closed fracture: Secondary | ICD-10-CM | POA: Diagnosis present

## 2015-09-30 DIAGNOSIS — Z87891 Personal history of nicotine dependence: Secondary | ICD-10-CM

## 2015-09-30 DIAGNOSIS — D573 Sickle-cell trait: Secondary | ICD-10-CM | POA: Diagnosis present

## 2015-09-30 DIAGNOSIS — Z79899 Other long term (current) drug therapy: Secondary | ICD-10-CM | POA: Diagnosis not present

## 2015-09-30 DIAGNOSIS — D631 Anemia in chronic kidney disease: Secondary | ICD-10-CM | POA: Diagnosis present

## 2015-09-30 LAB — CBC WITH DIFFERENTIAL/PLATELET
Basophils Absolute: 0 10*3/uL (ref 0.0–0.1)
Basophils Relative: 0 %
EOS PCT: 1 %
Eosinophils Absolute: 0.1 10*3/uL (ref 0.0–0.7)
HCT: 34.8 % — ABNORMAL LOW (ref 36.0–46.0)
HEMOGLOBIN: 11.1 g/dL — AB (ref 12.0–15.0)
LYMPHS PCT: 7 %
Lymphs Abs: 0.5 10*3/uL — ABNORMAL LOW (ref 0.7–4.0)
MCH: 27 pg (ref 26.0–34.0)
MCHC: 31.9 g/dL (ref 30.0–36.0)
MCV: 84.7 fL (ref 78.0–100.0)
MONO ABS: 0.6 10*3/uL (ref 0.1–1.0)
MONOS PCT: 8 %
NEUTROS ABS: 6.3 10*3/uL (ref 1.7–7.7)
Neutrophils Relative %: 84 %
Platelets: 154 10*3/uL (ref 150–400)
RBC: 4.11 MIL/uL (ref 3.87–5.11)
RDW: 15.6 % — ABNORMAL HIGH (ref 11.5–15.5)
WBC: 7.5 10*3/uL (ref 4.0–10.5)

## 2015-09-30 LAB — BASIC METABOLIC PANEL
ANION GAP: 11 (ref 5–15)
BUN: 20 mg/dL (ref 6–20)
CHLORIDE: 98 mmol/L — AB (ref 101–111)
CO2: 30 mmol/L (ref 22–32)
Calcium: 9.1 mg/dL (ref 8.9–10.3)
Creatinine, Ser: 5.05 mg/dL — ABNORMAL HIGH (ref 0.44–1.00)
GFR calc non Af Amer: 7 mL/min — ABNORMAL LOW (ref >60)
GFR, EST AFRICAN AMERICAN: 8 mL/min — AB (ref >60)
Glucose, Bld: 134 mg/dL — ABNORMAL HIGH (ref 65–99)
POTASSIUM: 3.2 mmol/L — AB (ref 3.5–5.1)
SODIUM: 139 mmol/L (ref 135–145)

## 2015-09-30 LAB — SURGICAL PCR SCREEN
MRSA, PCR: NEGATIVE
Staphylococcus aureus: NEGATIVE

## 2015-09-30 MED ORDER — ACETAMINOPHEN 500 MG PO TABS: 1000.0000 mg | ORAL_TABLET | Freq: Once | ORAL | Status: DC

## 2015-09-30 MED ORDER — BISACODYL 5 MG PO TBEC: 5.0000 mg | DELAYED_RELEASE_TABLET | Freq: Every day | ORAL | Status: DC | PRN

## 2015-09-30 MED ORDER — HYDROCODONE-ACETAMINOPHEN 5-325 MG PO TABS
1.0000 | ORAL_TABLET | Freq: Four times a day (QID) | ORAL | Status: DC | PRN
  Administered 2015-09-30 – 2015-10-01 (×2): 2 via ORAL
  Filled 2015-09-30 (×3): qty 2

## 2015-09-30 MED ORDER — POTASSIUM CHLORIDE CRYS ER 20 MEQ PO TBCR: 30.0000 meq | EXTENDED_RELEASE_TABLET | Freq: Once | ORAL | Status: DC

## 2015-09-30 MED ORDER — ACETAMINOPHEN 325 MG PO TABS: 650.0000 mg | ORAL_TABLET | Freq: Four times a day (QID) | ORAL | Status: DC | PRN

## 2015-09-30 MED ORDER — CEFAZOLIN SODIUM-DEXTROSE 2-4 GM/100ML-% IV SOLN
2.0000 g | INTRAVENOUS | Status: DC
  Filled 2015-09-30: qty 100

## 2015-09-30 MED ORDER — FENTANYL CITRATE (PF) 100 MCG/2ML IJ SOLN
50.0000 ug | Freq: Once | INTRAMUSCULAR | Status: AC
  Administered 2015-09-30: 50 ug via INTRAVENOUS
  Filled 2015-09-30: qty 2

## 2015-09-30 MED ORDER — NEPRO/CARBSTEADY PO LIQD
237.0000 mL | Freq: Two times a day (BID) | ORAL | Status: DC
  Filled 2015-09-30 (×9): qty 237

## 2015-09-30 MED ORDER — CARVEDILOL 12.5 MG PO TABS
12.5000 mg | ORAL_TABLET | Freq: Two times a day (BID) | ORAL | Status: DC
  Administered 2015-10-01: 12.5 mg via ORAL
  Filled 2015-09-30 (×2): qty 1

## 2015-09-30 MED ORDER — ONDANSETRON HCL 4 MG/2ML IJ SOLN
4.0000 mg | Freq: Once | INTRAMUSCULAR | Status: AC
  Administered 2015-09-30: 4 mg via INTRAVENOUS
  Filled 2015-09-30: qty 2

## 2015-09-30 MED ORDER — CHLORHEXIDINE GLUCONATE 4 % EX LIQD
60.0000 mL | Freq: Once | CUTANEOUS | Status: AC
  Administered 2015-10-01: 4 via TOPICAL
  Filled 2015-09-30: qty 60

## 2015-09-30 MED ORDER — ACETAMINOPHEN 650 MG RE SUPP: 650.0000 mg | Freq: Four times a day (QID) | RECTAL | Status: DC | PRN

## 2015-09-30 MED ORDER — DEXTROSE-NACL 5-0.45 % IV SOLN
100.0000 mL/h | INTRAVENOUS | Status: DC
Start: 2015-09-30 — End: 2015-10-01
  Administered 2015-10-01: 100 mL/h via INTRAVENOUS

## 2015-09-30 MED ORDER — LATANOPROST 0.005 % OP SOLN
1.0000 [drp] | Freq: Every day | OPHTHALMIC | Status: DC
  Administered 2015-09-30 – 2015-10-02 (×3): 1 [drp] via OPHTHALMIC
  Filled 2015-09-30: qty 2.5

## 2015-09-30 MED ORDER — CALCIUM ACETATE (PHOS BINDER) 667 MG PO CAPS
667.0000 mg | ORAL_CAPSULE | Freq: Three times a day (TID) | ORAL | Status: DC
  Administered 2015-10-01 – 2015-10-02 (×3): 667 mg via ORAL
  Filled 2015-09-30 (×3): qty 1

## 2015-09-30 MED ORDER — MORPHINE SULFATE (PF) 2 MG/ML IV SOLN
0.5000 mg | INTRAVENOUS | Status: DC | PRN
  Administered 2015-09-30 (×2): 0.5 mg via INTRAVENOUS
  Filled 2015-09-30 (×2): qty 1

## 2015-09-30 MED ORDER — ONDANSETRON HCL 4 MG/2ML IJ SOLN
4.0000 mg | Freq: Four times a day (QID) | INTRAMUSCULAR | Status: DC | PRN
  Administered 2015-10-01: 4 mg via INTRAVENOUS

## 2015-09-30 MED ORDER — ATORVASTATIN CALCIUM 80 MG PO TABS
80.0000 mg | ORAL_TABLET | Freq: Every day | ORAL | Status: DC
  Administered 2015-10-03: 80 mg via ORAL
  Filled 2015-09-30 (×2): qty 1

## 2015-09-30 MED ORDER — LATANOPROST 0.005 % OP SOLN: 1.0000 [drp] | Freq: Every day | OPHTHALMIC | Status: DC

## 2015-09-30 MED ORDER — ONDANSETRON HCL 4 MG PO TABS: 4.0000 mg | ORAL_TABLET | Freq: Four times a day (QID) | ORAL | Status: DC | PRN

## 2015-09-30 MED ORDER — BISACODYL 10 MG RE SUPP: 10.0000 mg | Freq: Every day | RECTAL | Status: DC | PRN

## 2015-09-30 MED ORDER — HEPARIN SODIUM (PORCINE) 5000 UNIT/ML IJ SOLN: 5000.0000 [IU] | Freq: Three times a day (TID) | INTRAMUSCULAR | Status: AC

## 2015-09-30 MED ORDER — SENNOSIDES-DOCUSATE SODIUM 8.6-50 MG PO TABS: 1.0000 | ORAL_TABLET | Freq: Every evening | ORAL | Status: DC | PRN

## 2015-09-30 MED ORDER — POVIDONE-IODINE 10 % EX SWAB: 2.0000 "application " | Freq: Once | CUTANEOUS | Status: DC

## 2015-09-30 NOTE — ED Provider Notes (Signed)
TIME SEEN: 4:20 AM  CHIEF COMPLAINT: Fall, left hip pain  HPI: Pt is a 82 y.o. female with history of dementia, coronary artery disease status post CABG, ischemic cardiomyopathy, hypertension, previous stroke, end-stage renal disease on hemodialysis who was last dialyzed yesterday who presents to the emergency department after she states she got up from bed to go to the bathroom. She states that she thinks she slipped and fell landing on her left side. She does not think she had her head but cannot remember for sure. Fall was unwitnessed. She denies any loss of consciousness. Denies headache, numbness, tingling or focal weakness. Denies any chest pain or shortness of breath. No abdominal pain. Is complaining of left hip pain. Is able to ambulate without a cane or walker at baseline. Pt is on Plavix.  PCP - Dr. Shary DecampBrian McKenzie with [facility]  ROS: See HPI Constitutional: no fever  Eyes: no drainage  ENT: no runny nose   Cardiovascular:  no chest pain  Resp: no SOB  GI: no vomiting GU: no dysuria Integumentary: no rash  Allergy: no hives  Musculoskeletal: no leg swelling  Neurological: no slurred speech ROS otherwise negative  PAST MEDICAL HISTORY/PAST SURGICAL HISTORY:  Past Medical History  Diagnosis Date  . Myocardial infarction (HCC) 12-03-1995  . CAD in native artery 1997    CABG 1997, Last stress test 10/09 no ischemia  . S/P CABG x 5 1997    LIMA-LAD, SVG-OM, Seq SVG-RPDA-RPL  . Ischemic cardiomyopathy     EF by echo 11/2010 EF 35-45%, LA is moderately dilated, mod. MR, mod. to severe TR, RV systolic pressure 40-50 mmHg  . Moderate mitral regurgitation 11/2010  . Stroke, lacunar (HCC) 1994  . Carotid disease, bilateral (HCC)     Most recent Dopplers April 2014: Right carotid 50-69% (lower range);  lleft less than 50%.  . Hypertension, renal disease   . Dyslipidemia   . ESRD (end stage renal disease) (HCC) On dialysis for ~5 years    [facility]    using ffistula in left upper arm--also has functioning  fisutla in rt upper arm--which has aneurysm and not being used--pt states blood draws from rt hand and b/p's in legs  . Peripheral vascular disease (HCC) 03/2008    Stents to rt SFA, Dopplers from April 2014: No significant change, last PV cath 03/29/08  -- fairly widely patent R. SFA stent. Left SFA less than 50%. Proximal but occluded in the mid vessel with reconstitution distally. Right PT and AP occluded as described previously an angiography. Left AP occluded also noted angiographically   . Anemia due to chronic renal failure treated with erythropoietin   . Vulvar lesion   . Osteoarthritis   . Hx of sickle cell trait   . H/O pulmonary fibrosis   . Shortness of breath     RELATED TO DIALYSIS -TOO MUCH FLUID  . Alzheimer's dementia   . Hypertension     MEDICATIONS:  Prior to Admission medications   Medication Sig Start Date End Date Taking? Authorizing Provider  Artificial Tear Ointment (DRY EYES OP) Place 1 drop into both eyes daily as needed (for dry eyes).     Historical Provider, MD  atorvastatin (LIPITOR) 80 MG tablet Take 1 tablet (80 mg total) by mouth daily. 09/22/15   Marykay Lexavid W Harding, MD  B Complex-C-Folic Acid (DIALYVITE 800 PO) Take 1 capsule by mouth daily.     Historical Provider, MD  calcium  acetate (PHOSLO) 667 MG capsule Take 667 mg by mouth 3 (three) times daily with meals.     Historical Provider, MD  carvedilol (COREG) 12.5 MG tablet Take 1 tablet (12.5 mg total) by mouth 2 (two) times daily with a meal. 09/22/15   Marykay Lex, MD  clopidogrel (PLAVIX) 75 MG tablet TAKE ONE TABLET BY MOUTH ONCE DAILY Patient taking differently: TAKE ONE TABLET (75 mg) BY MOUTH ONCE DAILY 11/15/14   Marykay Lex, MD  latanoprost (XALATAN) 0.005 % ophthalmic solution Place 1 drop into both eyes at bedtime.    Historical Provider, MD  oxyCODONE-acetaminophen (PERCOCET/ROXICET) 5-325 MG tablet Take 1 tablet by mouth  every 6 (six) hours as needed. 09/21/15   Lars Mage, PA-C  Travoprost, BAK Free, (TRAVATAN) 0.004 % SOLN ophthalmic solution Place 1 drop into both eyes at bedtime.    Historical Provider, MD    ALLERGIES:  Allergies  Allergen Reactions  . Aspirin Itching  . Pletal [Cilostazol]     dizziness    SOCIAL HISTORY:  Social History  Substance Use Topics  . Smoking status: Former Smoker    Types: Cigarettes    Quit date: 11/01/1991  . Smokeless tobacco: Never Used  . Alcohol Use: No    FAMILY HISTORY: Family History  Problem Relation Age of Onset  . Colon cancer Neg Hx   . Anesthesia problems Neg Hx   . Heart disease Sister   . Sudden death Sister 86  . Heart disease Mother   . Sudden death Mother 60  . Sudden death Father 35  . Stroke Father   . Sudden death Brother 74  . Heart disease Brother     EXAM: BP 102/44 mmHg  Pulse 50  Temp(Src) 97.4 F (36.3 C) (Oral)  Resp 18  SpO2 100% CONSTITUTIONAL: Alert and oriented to person and place but not to time which her family reports is chronic and responds appropriately to questions. Elderly, chronically ill-appearing, in no significant distress, thin HEAD: Normocephalic; atraumatic EYES: Conjunctivae clear, PERRL, EOMI ENT: normal nose; no rhinorrhea; moist mucous membranes; pharynx without lesions noted; no dental injury; no septal hematoma NECK: Supple, no meningismus, no LAD; no midline spinal tenderness, step-off or deformity CARD: Regular and bradycardic; S1 and S2 appreciated; no murmurs, no clicks, no rubs, no gallops RESP: Normal chest excursion without splinting or tachypnea; breath sounds clear and equal bilaterally; no wheezes, no rhonchi, no rales; no hypoxia or respiratory distress CHEST:  chest wall stable, no crepitus or ecchymosis or deformity, nontender to palpation ABD/GI: Normal bowel sounds; non-distended; soft, non-tender, no rebound, no guarding PELVIS:  stable, tender to palpation over the left  hip BACK:  The back appears normal and is non-tender to palpation, there is no CVA tenderness; no midline spinal tenderness, step-off or deformity EXT: Tender over the left hip and left leg is shortened and externally rotated, decreased range of motion of this joint secondary to pain, otherwise Normal ROM in all joints; non-tender to palpation; no edema; normal capillary refill; no cyanosis, otherwise no bony tenderness or bony deformity of patient's extremities, no joint effusion, no ecchymosis or laceration, 2+ DP and radial pulses bilaterally, she has an old fistula in the upper right extremity that is no longer being used and has no thrill or bruit, AV fistula in the left upper extremity with good thrill and bruit with no erythema, warmth, drainage or bleeding    SKIN: Normal color for age and race; warm NEURO: Moves all  extremities equally, sensation to light touch intact diffusely, cranial nerves II through XII intact PSYCH: The patient's mood and manner are appropriate. Grooming and personal hygiene are appropriate.  MEDICAL DECISION MAKING: Patient here with possible mechanical fall with complaints of left hip pain. Leg is shortened, externally rotated. Not sure if she hit her head and she is on Plavix. At her neurologic baseline per daughter. We'll obtain CT of her head and cervical spine, x-ray of her chest, left hip. We'll give Bentyl for pain. Will check labs, EKG.  ED PROGRESS: 7:30 AM  Pt's CT head and cervical spine show no acute injury. Chest x-ray clear. Labs unremarkable. EKG shows no new abnormality. She does have a left intertrochanteric hip fracture. Will consult orthopedics on call. She will need admission to medicine.  8:10 AM  D/w Dr. Eulah Pont with orthopedics. He will see the patient and family in consult today. Would like to take patient to the operating room tomorrow. We will page orthopedics back to let them know the patient is on Plavix. Will hold her Plavix at this time. She is  scheduled for dialysis tomorrow. Does not appear volume overloaded currently. Discussed with Gunnar Fusi, nurse practitioner with hospital service. They agree on admission. She will see patient in the emergency department and place orders herself for admission. Patient and family have been updated with this plan.   I reviewed all nursing notes, vitals, pertinent old records, EKGs, labs, imaging (as available).        EKG Interpretation  Date/Time:  Saturday Sep 30 2015 05:08:13 EDT Ventricular Rate:  48 PR Interval:  184 QRS Duration: 114 QT Interval:  471 QTC Calculation: 421 R Axis:   61 Text Interpretation:  Age not entered, assumed to be  82 years old for purpose of ECG interpretation Sinus bradycardia LVH with secondary repolarization abnormality No significant change since last tracing in 2012 Confirmed by WARD,  DO, KRISTEN [phone]) on 09/30/2015 5:15:18 AM        Layla Maw Ward, DO 09/30/15 9767

## 2015-09-30 NOTE — Progress Notes (Signed)
Orthopedic Tech Progress Note Patient Details:  Courtney [name] [birth year] [id number] Applied Buck's traction to LLE. 10LB weight applied.  Applied OHF with trapeze to pt.'s bed. Musculoskeletal Traction Type of Traction: Bucks Skin Traction Traction Location: LLE Traction Weight: 10 lbs    Lesle ChrisGilliland, Zeina Akkerman L 09/30/2015, 5:01 PM

## 2015-09-30 NOTE — Progress Notes (Signed)
Initial Nutrition Assessment  DOCUMENTATION CODES:   Severe malnutrition in context of chronic illness, Underweight  INTERVENTION:   Nepro Shake po BID, each supplement provides 425 kcal and 19 grams protein  NUTRITION DIAGNOSIS:   Malnutrition related to chronic illness (ESRD) as evidenced by severe depletion of body fat, severe depletion of muscle mass.  GOAL:   Patient will meet greater than or equal to 90% of their needs  MONITOR:   PO intake, Supplement acceptance, I & O's, Weight trends  REASON FOR ASSESSMENT:   Consult Hip fracture protocol  ASSESSMENT:   Pt admitted after fall at home with new hip fx. Pt with hx of ESRD on HD.    Spoke with pt and family. Pt does not eat much. She has nepro shakes at home but does not consume them often. Pt feels that she has had weight loss recently but is unsure of how much.  Lunch at bedside untouched. Pt does not want to eat right now. Pt seemed to forget that she had told me things and would repeat them.    Medications reviewed and include: phoslo, KDur Labs reviewed: potassium low 3.2 Nutrition-Focused physical exam completed. Findings are severe fat depletion, severe muscle depletion, and no edema.     Diet Order:  Diet renal with fluid restriction Fluid restriction:: 1200 mL Fluid; Room service appropriate?: Yes; Fluid consistency:: Thin  Skin:  Reviewed, no issues (right arm incision)  Last BM:  unknown  Height:   Ht Readings from Last 1 Encounters:  09/19/15 5\' 2"  (1.575 m)    Weight:   Wt Readings from Last 1 Encounters:  09/21/15 83 lb (37.649 kg)    Ideal Body Weight:  50 kg  BMI:  15.2 - underweight  Estimated Nutritional Needs:   Kcal:  1100-1300  Protein:  60-75 grams  Fluid:  > 1.5 L/day  EDUCATION NEEDS:   No education needs identified at this time  Kendell BaneHeather Jull Harral RD, LDN, CNSC [phone] Pager [phone] After Hours Pager

## 2015-09-30 NOTE — Consult Note (Signed)
ORTHOPAEDIC CONSULTATION  REQUESTING PHYSICIAN: Pearson Grippe, MD  Chief Complaint: Left Hip pain  Assessment: Active Problems:   Dyslipidemia   Ischemic cardiomyopathy   ESRD on dialysis [facility]) - Dialysis on M/W/F    Coronary artery disease involving native coronary artery without angina pectoris   Hip fracture (HCC) - LEFT   Hypertension   Bradycardia   Hypokalemia Dementia.   Plan: Left Hip fracture with planned intertrochanteric nail surgery planned for 10/01/15. Plavix held. Weight Bearing Status: NWB until post op, then WBAT PT VTE px: per primary  HPI: Courtney [name] is a 82 y.o. female who tripped in the bathroom at about 0330 09/30/15.  Found by daughter.  With left hip pain seen in the ED, admitted to medicine, and referred to ortho for evaluation and treatment of left hip fracture.  No pain currently, but with intermittent burning sensation in left foot.  No CP, SOB.  Eating, drinking, and voiding w/o complaint.  Past Medical History  Diagnosis Date  . Myocardial infarction (HCC) 12-03-1995  . CAD in native artery 1997    CABG 1997, Last stress test 10/09 no ischemia  . S/P CABG x 5 1997    LIMA-LAD, SVG-OM, Seq SVG-RPDA-RPL  . Ischemic cardiomyopathy     EF by echo 11/2010 EF 35-45%, LA is moderately dilated, mod. MR, mod. to severe TR, RV systolic pressure 40-50 mmHg  . Moderate mitral regurgitation 11/2010  . Stroke, lacunar (HCC) 1994  . Carotid disease, bilateral (HCC)     Most recent Dopplers April 2014: Right carotid 50-69% (lower range);  lleft less than 50%.  . Hypertension, renal disease   . Dyslipidemia   . 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 Peripheral vascular disease (HCC) 03/2008    Stents to rt SFA,  Dopplers from April 2014: No significant change, last PV cath 03/29/08  -- fairly widely patent R. SFA stent. Left SFA less than 50%. Proximal but occluded in the mid vessel with reconstitution distally. Right PT and AP occluded as described previously an angiography. Left AP occluded also noted angiographically   . Anemia due to chronic renal failure treated with erythropoietin   . Vulvar lesion   . Osteoarthritis   . Hx of sickle cell trait   . H/O pulmonary fibrosis   . Shortness of breath     RELATED TO DIALYSIS -TOO MUCH FLUID  . Alzheimer's dementia   . Hypertension    Past Surgical History  Procedure Laterality Date  . Cholecystectomy    . Coronary artery bypass graft  1997    LIMA-LAD; seq VG-PDA&PLA; VG-OM  . Vulvar lesion removal  04/23/2011    Procedure: VULVAR LESION;  Surgeon: Laurette Schimke, MD;  Location: [facility] ORS;  Service: Gynecology;  Laterality: N/A;  Wide Local Excision Of Vulvar Lesion   . Femoral artery stent Right 03/2008    6 mm X 150 mm + 6 mm x 100 mm SMART stent;;   . Moles       from thigh s removed.  Marland Kitchen Av fistula placement Right 2001    AVF  . Abdominal aortagram N/A 07/18/2011    Procedure: ABDOMINAL Ronny Flurry;  Surgeon: Fransisco Hertz,  MD;  Location: [facility] CATH LAB;  Service: Cardiovascular;  Laterality: N/A;  . Shuntogram N/A 09/05/2011    Procedure: Betsey Amen;  Surgeon: Fransisco Hertz, MD;  Location: [facility] CATH LAB;  Service: Cardiovascular;  Laterality: N/A;  . Ligation of arteriovenous  fistula Right 09/21/2015    Procedure: LIGATION OF RIGHT UPPER ARM ARTERIOVENOUS  FISTULA;  Surgeon: Pryor Ochoa, MD;  Location: [facility] OR;  Service: Vascular;  Laterality: Right;   Social History   Social History  . Marital Status: Single    Spouse Name: N/A  . Number of Children: 1  . Years of Education: N/A   Occupational History  . Retired-dietary     from [employer]   Social History Main Topics  . Smoking status: Former Smoker    Types: Cigarettes    Quit date:  11/01/1991  . Smokeless tobacco: Never Used  . Alcohol Use: No  . Drug Use: No  . Sexual Activity: No   Other Topics Concern  . None   Social History Narrative   Single mother of one, grandmother of one. She is not exercising as much as he used to. She used to dance and a lot of walking. But of late she has not been as much. She states that when she turned 14 she became lazy.   She does not smoke and does not drink.   Again we discussed her goals of care and desires as far as resuscitation go. She continues to state that she wants to be DO NOT RESUSCITATE DO NOT INTUBATE, but does not have the form to state that. She also is not interested in invasive evaluation unless directed by symptoms. With that in mind, she is not in favor of continued noninvasive evaluation.   Family History  Problem Relation Age of Onset  . Colon cancer Neg Hx   . Anesthesia problems Neg Hx   . Heart disease Sister   . Sudden death Sister 75  . Heart disease Mother   . Sudden death Mother 48  . Sudden death Father 41  . Stroke Father   . Sudden death Brother 75  . Heart disease Brother    Allergies  Allergen Reactions  . Aspirin Itching  . Pletal [Cilostazol]     dizziness   Prior to Admission medications   Medication Sig Start Date End Date Taking? Authorizing Provider  Artificial Tear Ointment (DRY EYES OP) Place 1 drop into both eyes daily as needed (for dry eyes).    Yes Historical Provider, MD  atorvastatin (LIPITOR) 80 MG tablet Take 1 tablet (80 mg total) by mouth daily. 09/22/15  Yes Marykay Lex, MD  B Complex-C-Folic Acid (DIALYVITE 800 PO) Take 1 capsule by mouth daily.    Yes Historical Provider, MD  calcium acetate (PHOSLO) 667 MG capsule Take 667 mg by mouth 3 (three) times daily with meals.    Yes Historical Provider, MD  carvedilol (COREG) 12.5 MG tablet Take 1 tablet (12.5 mg total) by mouth 2 (two) times daily with a meal. 09/22/15  Yes Marykay Lex, MD  clopidogrel (PLAVIX) 75 MG  tablet TAKE ONE TABLET BY MOUTH ONCE DAILY Patient taking differently: TAKE ONE TABLET (75 mg) BY MOUTH ONCE DAILY 11/15/14  Yes Marykay Lex, MD  latanoprost (XALATAN) 0.005 % ophthalmic solution Place 1 drop into both eyes at bedtime.   Yes Historical Provider, MD  Travoprost, BAK Free, (TRAVATAN) 0.004 % SOLN ophthalmic solution Place 1 drop into both eyes at  bedtime.   Yes Historical Provider, MD  oxyCODONE-acetaminophen (PERCOCET/ROXICET) 5-325 MG tablet Take 1 tablet by mouth every 6 (six) hours as needed. Patient not taking: Reported on 09/30/2015 09/21/15   Lars MageEmma M Collins, PA-C   Dg Chest 1 View  09/30/2015  CLINICAL DATA:  Fall, left hip deformity EXAM: CHEST 1 VIEW COMPARISON:  04/18/2011 FINDINGS: Chronic interstitial markings/emphysematous changes with scarring in the right upper lobe. No focal consolidation. No pleural effusion or pneumothorax. The heart is normal in size. Postsurgical changes related to prior CABG. Median sternotomy. IMPRESSION: No evidence of acute cardiopulmonary disease. Electronically Signed   By: Charline BillsSriyesh  Krishnan M.D.   On: 09/30/2015 07:14   Ct Head Wo Contrast  09/30/2015  CLINICAL DATA:  Fall this morning. EXAM: CT HEAD WITHOUT CONTRAST CT CERVICAL SPINE WITHOUT CONTRAST TECHNIQUE: Multidetector CT imaging of the head and cervical spine was performed following the standard protocol without intravenous contrast. Multiplanar CT image reconstructions of the cervical spine were also generated. COMPARISON:  None. FINDINGS: CT HEAD FINDINGS The ventricles are normal in configuration. There is ventricular and sulcal enlargement reflecting mild, diffuse atrophy. There are no parenchymal masses or mass effect. Patchy white matter hypoattenuation is noted consistent with moderate chronic microvascular ischemic change. There is no evidence of a recent cortical infarct. There are no extra-axial masses or abnormal fluid collections. There is no intracranial hemorrhage. The  visualized sinuses and mastoid air cells are clear. No skull fracture. CT CERVICAL SPINE FINDINGS No fracture. Slight, grade 1, anterolisthesis of C4 on C5. There is straightening of the normal cervical lordosis. Mild loss of disc height at C3-C4. Moderate loss disc height at C4-C5, C5-C6 and C6-C7. There is facet degenerative change most evident on the right at C3-C4 and C4-C5. The bones are diffusely demineralized. Soft tissues show vascular calcifications but are otherwise unremarkable. Lung apices demonstrate emphysema and scarring. IMPRESSION: HEAD CT:  No acute intracranial abnormalities.  No skull fracture. CERVICAL CT:  No fracture or acute finding. Electronically Signed   By: Amie Portlandavid  Ormond M.D.   On: 09/30/2015 07:11   Ct Cervical Spine Wo Contrast  09/30/2015  CLINICAL DATA:  Fall this morning. EXAM: CT HEAD WITHOUT CONTRAST CT CERVICAL SPINE WITHOUT CONTRAST TECHNIQUE: Multidetector CT imaging of the head and cervical spine was performed following the standard protocol without intravenous contrast. Multiplanar CT image reconstructions of the cervical spine were also generated. COMPARISON:  None. FINDINGS: CT HEAD FINDINGS The ventricles are normal in configuration. There is ventricular and sulcal enlargement reflecting mild, diffuse atrophy. There are no parenchymal masses or mass effect. Patchy white matter hypoattenuation is noted consistent with moderate chronic microvascular ischemic change. There is no evidence of a recent cortical infarct. There are no extra-axial masses or abnormal fluid collections. There is no intracranial hemorrhage. The visualized sinuses and mastoid air cells are clear. No skull fracture. CT CERVICAL SPINE FINDINGS No fracture. Slight, grade 1, anterolisthesis of C4 on C5. There is straightening of the normal cervical lordosis. Mild loss of disc height at C3-C4. Moderate loss disc height at C4-C5, C5-C6 and C6-C7. There is facet degenerative change most evident on the right  at C3-C4 and C4-C5. The bones are diffusely demineralized. Soft tissues show vascular calcifications but are otherwise unremarkable. Lung apices demonstrate emphysema and scarring. IMPRESSION: HEAD CT:  No acute intracranial abnormalities.  No skull fracture. CERVICAL CT:  No fracture or acute finding. Electronically Signed   By: Amie Portlandavid  Ormond M.D.   On: 09/30/2015 07:11  Dg Hip Unilat W Or W/o Pelvis 2-3 Views Left  09/30/2015  CLINICAL DATA:  Fall, left hip deformity EXAM: DG HIP (WITH OR WITHOUT PELVIS) 2-3V LEFT COMPARISON:  None. FINDINGS: Intertrochanteric left hip fracture. Varus angulation with foreshortening. Bilateral hip joint spaces are preserved. Bony pelvis appears intact. Vascular calcifications. Left iliac stent. Additional vascular stent in the right medial thigh. IMPRESSION: Intertrochanteric left hip fracture, as above. Electronically Signed   By: Charline Bills M.D.   On: 09/30/2015 07:16    Positive ROS: All other systems have been reviewed and were otherwise negative with the exception of those mentioned in the HPI and as above.  Objective: Labs cbc  Recent Labs  09/30/15 0520  WBC 7.5  HGB 11.1*  HCT 34.8*  PLT 154    Recent Labs  09/30/15 0520  NA 139  K 3.2*  CL 98*  CO2 30  GLUCOSE 134*  BUN 20  CREATININE 5.05*  CALCIUM 9.1    Physical Exam: Filed Vitals:   09/30/15 0815 09/30/15 0943  BP: 159/63 197/49  Pulse: 54 57  Temp:  98.2 F (36.8 C)  Resp:  19   General: Thin, Alert, no acute distress.  Daughter at bedside. Cardiovascular: No pedal edema Respiratory: No cyanosis, no use of accessory musculature GI: No organomegaly, abdomen is soft and non-tender Skin: No lesions in the area of chief complaint other than those listed below in MSK exam.  Neurologic: Sensation intact distally Psychiatric: Patient is alert with normal mood and affect.  She does have dementia.  MUSCULOSKELETAL:  Left Hip: No erythema, lesion, or sign of infection.   Leg externally rotated.  Wiggles toes, decreased EHL movement, FHL intact.  Distal sensation intact.  NVI.  DP present.  Foot warm.  Skin dry.   Other extremities are atraumatic with painless ROM and NVI.   Albina Billet III PA-C 09/30/2015 4:55 PM

## 2015-09-30 NOTE — ED Notes (Signed)
Patient reports that she was ambulating to the bathroom & fell outside of the bathroom. Patient called out for daughter. Daughter found patient face down in hallway, c/o pain in L hip. Rates pain 3/10. No other complaints.

## 2015-09-30 NOTE — H&P (Signed)
History and Physical    Courtney [name]:[id number] DOB: 08-22-1930 DOA: 09/30/2015  PCP: [name], MD  Patient coming from:  Home   Chief Complaint:  fall   HPI: Courtney [name] is a 82 y.o. female with medical history significant for but not necessarily limited to,  dementia, coronary artery disease/ CABG, hypertension, stroke and end-stage renal disease on hemodialysis Tuesday, Thursday, Saturday. Patient brought to ED this am after a fall a home while walking to the bathroom.Patient states she tripped over something. She wasn't dizzy. Daughter found her mother face down on the floor outside the bathroom . Complained of left hip pain initially, now complains of burning in her left foot. Patient is on Plavix at home . Daughter has dementia, history therefore may be unreliable. Daughter in room, helps provide history.   ED Course:  Bradycardic with heart rate of 50 Head and cervical CT negative for acute findings. Left hip x-ray reveals intertrochanteric left hip fracture EDP called or so, spoke with Dr. Eulah Pont who plans to take patient to the OR tomorrow Pain control with fentanyl 50 g IV  Review of Systems: Not obtained. History unreliable, patient has dementia. At present patient does not even realize that she has fractured her hip  Past Medical History  Diagnosis Date  . Myocardial infarction (HCC) 12-03-1995  . CAD in native artery 1997    CABG 1997, Last stress test 10/09 no ischemia  . S/P CABG x 5 1997    LIMA-LAD, SVG-OM, Seq SVG-RPDA-RPL  . Ischemic cardiomyopathy     EF by echo 11/2010 EF 35-45%, LA is moderately dilated, mod. MR, mod. to severe TR, RV systolic pressure 40-50 mmHg  . Moderate mitral regurgitation 11/2010  . Stroke, lacunar (HCC) 1994  . Carotid disease, bilateral (HCC)     Most recent Dopplers April 2014: Right carotid 50-69% (lower range);  lleft less than 50%.  . Hypertension, renal disease   . Dyslipidemia   . 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 Peripheral vascular disease (HCC) 03/2008    Stents to rt SFA, Dopplers from April 2014: No significant change, last PV cath 03/29/08  -- fairly widely patent R. SFA stent. Left SFA less than 50%. Proximal but occluded in the mid vessel with reconstitution distally. Right PT and AP occluded as described previously an angiography. Left AP occluded also noted angiographically   . Anemia due to chronic renal failure treated with erythropoietin   . Vulvar lesion   . Osteoarthritis   . Hx of sickle cell trait   . H/O pulmonary fibrosis   . Shortness of breath     RELATED TO DIALYSIS -TOO MUCH FLUID  . Alzheimer's dementia   . Hypertension     Past Surgical History  Procedure Laterality Date  . Cholecystectomy    . Coronary artery bypass graft  1997    LIMA-LAD; seq VG-PDA&PLA; VG-OM  . Vulvar lesion removal  04/23/2011    Procedure: VULVAR LESION;  Surgeon: Laurette Schimke, MD;  Location: [facility] ORS;  Service: Gynecology;  Laterality: N/A;  Wide Local Excision Of Vulvar Lesion   . Femoral artery stent Right 03/2008    6 mm X 150 mm +  6 mm x 100 mm SMART stent;;   . Moles       from thigh s removed.  Marland Kitchen Av fistula placement Right 2001    AVF  . Abdominal aortagram N/A 07/18/2011    Procedure: ABDOMINAL Ronny Flurry;  Surgeon: Fransisco Hertz, MD;  Location: [facility] CATH LAB;  Service: Cardiovascular;  Laterality: N/A;  . Shuntogram N/A 09/05/2011    Procedure: Betsey Amen;  Surgeon: Fransisco Hertz, MD;  Location: [facility] CATH LAB;  Service: Cardiovascular;  Laterality: N/A;  . Ligation of arteriovenous  fistula Right 09/21/2015    Procedure: LIGATION OF RIGHT UPPER ARM ARTERIOVENOUS  FISTULA;  Surgeon: Pryor Ochoa, MD;  Location: [facility] OR;  Service: Vascular;   Laterality: Right;     reports that she quit smoking about 23 years ago. Her smoking use included Cigarettes. She has never used smokeless tobacco. She reports that she does not drink alcohol or use illicit drugs.  Allergies  Allergen Reactions  . Aspirin Itching  . Pletal [Cilostazol]     dizziness    Family History  Problem Relation Age of Onset  . Colon cancer Neg Hx   . Anesthesia problems Neg Hx   . Heart disease Sister   . Sudden death Sister 26  . Heart disease Mother   . Sudden death Mother 65  . Sudden death Father 48  . Stroke Father   . Sudden death Brother 53  . Heart disease Brother     Prior to Admission medications   Medication Sig Start Date End Date Taking? Authorizing Provider  Artificial Tear Ointment (DRY EYES OP) Place 1 drop into both eyes daily as needed (for dry eyes).    Yes Historical Provider, MD  atorvastatin (LIPITOR) 80 MG tablet Take 1 tablet (80 mg total) by mouth daily. 09/22/15  Yes Marykay Lex, MD  B Complex-C-Folic Acid (DIALYVITE 800 PO) Take 1 capsule by mouth daily.    Yes Historical Provider, MD  calcium acetate (PHOSLO) 667 MG capsule Take 667 mg by mouth 3 (three) times daily with meals.    Yes Historical Provider, MD  carvedilol (COREG) 12.5 MG tablet Take 1 tablet (12.5 mg total) by mouth 2 (two) times daily with a meal. 09/22/15  Yes Marykay Lex, MD  clopidogrel (PLAVIX) 75 MG tablet TAKE ONE TABLET BY MOUTH ONCE DAILY Patient taking differently: TAKE ONE TABLET (75 mg) BY MOUTH ONCE DAILY 11/15/14  Yes Marykay Lex, MD  latanoprost (XALATAN) 0.005 % ophthalmic solution Place 1 drop into both eyes at bedtime.   Yes Historical Provider, MD  Travoprost, BAK Free, (TRAVATAN) 0.004 % SOLN ophthalmic solution Place 1 drop into both eyes at bedtime.   Yes Historical Provider, MD  oxyCODONE-acetaminophen (PERCOCET/ROXICET) 5-325 MG tablet Take 1 tablet by mouth every 6 (six) hours as needed. Patient not taking: Reported on 09/30/2015  09/21/15   Lars Mage, PA-C    Physical Exam: Filed Vitals:   09/30/15 0422 09/30/15 0439 09/30/15 0500 09/30/15 0530  BP: 88/47 102/44 102/39 103/47  Pulse: 50  48 50  Temp: 97.4 F (36.3 C)     TempSrc: Oral     Resp: 18   19  SpO2: 100%  100% 100%    Constitutional:  Pleasant, thin black female in NAD, calm, comfortable Filed Vitals:   09/30/15 0422 09/30/15 0439 09/30/15 0500 09/30/15 0530  BP: 88/47 102/44 102/39 103/47  Pulse: 50  48 50  Temp: 97.4 F (36.3 C)  TempSrc: Oral     Resp: 18   19  SpO2: 100%  100% 100%   Eyes: PERRL, lids and conjunctivae normal ENMT: Mucous membranes are moist. Posterior pharynx clear of any exudate or lesions.  Neck: normal, supple, no masses Respiratory: clear to auscultation bilaterally, no wheezing, no crackles. Normal respiratory effort. No accessory muscle use.  Cardiovascular: Sinus rhythm. No extremity edema. 2+ pedal pulses. No carotid bruits.  Abdomen: no tenderness, no masses palpated. No hepatomegaly. Bowel sounds positive.  Musculoskeletal: no clubbing / cyanosis. No joint deformity upper and lower extremities. Good ROM, no contractures. Normal muscle tone.  Skin: no rashes, lesions, ulcers. No induration Neurologic: CN 2-12 grossly intact. Sensation intact, She has difficulty moving LLE, can wiggle toes.  Psychiatric: pleasant, cooperative. .   CBC:  Recent Labs Lab 09/30/15 0520  WBC 7.5  NEUTROABS 6.3  HGB 11.1*  HCT 34.8*  MCV 84.7  PLT 154   Basic Metabolic Panel:  Recent Labs Lab 09/30/15 0520  NA 139  K 3.2*  CL 98*  CO2 30  GLUCOSE 134*  BUN 20  CREATININE 5.05*  CALCIUM 9.1  Radiological Exams on Admission: Dg Chest 1 View  09/30/2015  CLINICAL DATA:  Fall, left hip deformity EXAM: CHEST 1 VIEW COMPARISON:  04/18/2011 FINDINGS: Chronic interstitial markings/emphysematous changes with scarring in the right upper lobe. No focal consolidation. No pleural effusion or pneumothorax. The heart is  normal in size. Postsurgical changes related to prior CABG. Median sternotomy. IMPRESSION: No evidence of acute cardiopulmonary disease. Electronically Signed   By: Charline BillsSriyesh  Krishnan M.D.   On: 09/30/2015 07:14   Ct Head Wo Contrast  09/30/2015  CLINICAL DATA:  Fall this morning. EXAM: CT HEAD WITHOUT CONTRAST CT CERVICAL SPINE WITHOUT CONTRAST TECHNIQUE: Multidetector CT imaging of the head and cervical spine was performed following the standard protocol without intravenous contrast. Multiplanar CT image reconstructions of the cervical spine were also generated. COMPARISON:  None. FINDINGS: CT HEAD FINDINGS The ventricles are normal in configuration. There is ventricular and sulcal enlargement reflecting mild, diffuse atrophy. There are no parenchymal masses or mass effect. Patchy white matter hypoattenuation is noted consistent with moderate chronic microvascular ischemic change. There is no evidence of a recent cortical infarct. There are no extra-axial masses or abnormal fluid collections. There is no intracranial hemorrhage. The visualized sinuses and mastoid air cells are clear. No skull fracture. CT CERVICAL SPINE FINDINGS No fracture. Slight, grade 1, anterolisthesis of C4 on C5. There is straightening of the normal cervical lordosis. Mild loss of disc height at C3-C4. Moderate loss disc height at C4-C5, C5-C6 and C6-C7. There is facet degenerative change most evident on the right at C3-C4 and C4-C5. The bones are diffusely demineralized. Soft tissues show vascular calcifications but are otherwise unremarkable. Lung apices demonstrate emphysema and scarring. IMPRESSION: HEAD CT:  No acute intracranial abnormalities.  No skull fracture. CERVICAL CT:  No fracture or acute finding. Electronically Signed   By: Amie Portlandavid  Ormond M.D.   On: 09/30/2015 07:11   Ct Cervical Spine Wo Contrast  09/30/2015  CLINICAL DATA:  Fall this morning. EXAM: CT HEAD WITHOUT CONTRAST CT CERVICAL SPINE WITHOUT CONTRAST TECHNIQUE:  Multidetector CT imaging of the head and cervical spine was performed following the standard protocol without intravenous contrast. Multiplanar CT image reconstructions of the cervical spine were also generated. COMPARISON:  None. FINDINGS: CT HEAD FINDINGS The ventricles are normal in configuration. There is ventricular and sulcal enlargement reflecting mild, diffuse atrophy. There are no  parenchymal masses or mass effect. Patchy white matter hypoattenuation is noted consistent with moderate chronic microvascular ischemic change. There is no evidence of a recent cortical infarct. There are no extra-axial masses or abnormal fluid collections. There is no intracranial hemorrhage. The visualized sinuses and mastoid air cells are clear. No skull fracture. CT CERVICAL SPINE FINDINGS No fracture. Slight, grade 1, anterolisthesis of C4 on C5. There is straightening of the normal cervical lordosis. Mild loss of disc height at C3-C4. Moderate loss disc height at C4-C5, C5-C6 and C6-C7. There is facet degenerative change most evident on the right at C3-C4 and C4-C5. The bones are diffusely demineralized. Soft tissues show vascular calcifications but are otherwise unremarkable. Lung apices demonstrate emphysema and scarring. IMPRESSION: HEAD CT:  No acute intracranial abnormalities.  No skull fracture. CERVICAL CT:  No fracture or acute finding. Electronically Signed   By: Amie Portland M.D.   On: 09/30/2015 07:11   Dg Hip Unilat W Or W/o Pelvis 2-3 Views Left  09/30/2015  CLINICAL DATA:  Fall, left hip deformity EXAM: DG HIP (WITH OR WITHOUT PELVIS) 2-3V LEFT COMPARISON:  None. FINDINGS: Intertrochanteric left hip fracture. Varus angulation with foreshortening. Bilateral hip joint spaces are preserved. Bony pelvis appears intact. Vascular calcifications. Left iliac stent. Additional vascular stent in the right medial thigh. IMPRESSION: Intertrochanteric left hip fracture, as above. Electronically Signed   By: Charline Bills M.D.   On: 09/30/2015 07:16    EKG: Independently reviewed.   EKG Interpretation  Date/Time:  Saturday Sep 30 2015 05:08:13 EDT Ventricular Rate:  48 PR Interval:  184 QRS Duration: 114 QT Interval:  471 QTC Calculation: 421 R Axis:   61 Text Interpretation:  Age not entered, assumed to be  83 years old for purpose of ECG interpretation Sinus bradycardia LVH with secondary repolarization abnormality No significant change since last tracing in 2012 Confirmed by WARD,  DO, KRISTEN [phone]) on 09/30/2015 5:15:18 AM      Assessment/Plan   Active Problems:   Dyslipidemia   Ischemic cardiomyopathy   ESRD on dialysis [facility])   Coronary artery disease involving native coronary artery without angina pectoris   Hip fracture (HCC)   Left hip fracture following fall at home   -Admit to medical bed - telemetry -Hip fracture order set utilized -Orthopedic consult. EDP spoke with Dr. Eulah Pont. Plan is to take patient to the operating room tomorrow.         Will hold Plavix for now .                        Bradycardia, HR 48-50. She typically runs in the 60-70s. -Holding coreg -monitor on telemetry  Hypokalemia, K+ 3.2. . Don't want to over replete as patient is ESRD and doesn't dialyze until tomorrow.  - po now -am bmet   CAD, remote CABG / Ischemic cardiomyopathy: Last echo 2012 - ejection fraction of 35-40% -On Plavix and beta blocker.. Holding Plavix - to OR tomorrow. Coreg held today for hypotension / bradycardia.   Hypertension. Hypotensive in ED, improving -holding Coreg for now  Dementia. Not on medications. Some memory disturbances Lives with daughter.    Hyperlipidemia -Continue home statin   DVT prophylaxis:   Heparin Code Status:    DNR  Family Communication: Spoke with daughter and in the room. She understands treatment plans Disposition Plan:  Discharge home  in 2-3 days         Consults called:   Orthopedics -  Dr. Eulah Pont Notified Nephrology - Dr.  Arlean Hopping of patient's admission  Admission status:  Observation - Medical bed / telemetry  Admission-  Medical bed  Telemetry    Willette Cluster NP [facility] Pager [phone]  If 7PM-7AM, please contact night-coverage [email or web address] Password [email or web address]  09/30/2015, 8:06 AM

## 2015-10-01 ENCOUNTER — Encounter (HOSPITAL_COMMUNITY)
Admission: EM | Disposition: A | Discharge status: Skilled Nursing Facility | Payer: Self-pay | Source: Home / Self Care | Attending: Internal Medicine

## 2015-10-01 ENCOUNTER — Inpatient Hospital Stay (HOSPITAL_COMMUNITY): Payer: Medicare Other

## 2015-10-01 ENCOUNTER — Inpatient Hospital Stay (HOSPITAL_COMMUNITY): Payer: Medicare Other | Admitting: Certified Registered"

## 2015-10-01 DIAGNOSIS — I2510 Atherosclerotic heart disease of native coronary artery without angina pectoris: Secondary | ICD-10-CM

## 2015-10-01 DIAGNOSIS — I10 Essential (primary) hypertension: Secondary | ICD-10-CM

## 2015-10-01 HISTORY — PX: INTRAMEDULLARY (IM) NAIL INTERTROCHANTERIC: SHX5875

## 2015-10-01 LAB — CBC
HCT: 35.6 % — ABNORMAL LOW (ref 36.0–46.0)
HEMOGLOBIN: 11.5 g/dL — AB (ref 12.0–15.0)
MCH: 27.1 pg (ref 26.0–34.0)
MCHC: 32.3 g/dL (ref 30.0–36.0)
MCV: 84 fL (ref 78.0–100.0)
Platelets: 177 10*3/uL (ref 150–400)
RBC: 4.24 MIL/uL (ref 3.87–5.11)
RDW: 15.4 % (ref 11.5–15.5)
WBC: 7.3 10*3/uL (ref 4.0–10.5)

## 2015-10-01 LAB — BASIC METABOLIC PANEL
ANION GAP: 13 (ref 5–15)
BUN: 37 mg/dL — ABNORMAL HIGH (ref 6–20)
CALCIUM: 9.3 mg/dL (ref 8.9–10.3)
CO2: 28 mmol/L (ref 22–32)
Chloride: 96 mmol/L — ABNORMAL LOW (ref 101–111)
Creatinine, Ser: 7.06 mg/dL — ABNORMAL HIGH (ref 0.44–1.00)
GFR, EST AFRICAN AMERICAN: 6 mL/min — AB (ref >60)
GFR, EST NON AFRICAN AMERICAN: 5 mL/min — AB (ref >60)
GLUCOSE: 125 mg/dL — AB (ref 65–99)
Potassium: 4.4 mmol/L (ref 3.5–5.1)
SODIUM: 137 mmol/L (ref 135–145)

## 2015-10-01 SURGERY — FIXATION, FRACTURE, INTERTROCHANTERIC, WITH INTRAMEDULLARY ROD
Anesthesia: General | Site: Hip | Laterality: Left

## 2015-10-01 MED ORDER — ACETAMINOPHEN 650 MG RE SUPP: 650.0000 mg | Freq: Four times a day (QID) | RECTAL | Status: DC | PRN

## 2015-10-01 MED ORDER — LIDOCAINE 2% (20 MG/ML) 5 ML SYRINGE
INTRAMUSCULAR | Status: AC
  Filled 2015-10-01: qty 5

## 2015-10-01 MED ORDER — CEFAZOLIN SODIUM-DEXTROSE 2-4 GM/100ML-% IV SOLN
2.0000 g | Freq: Four times a day (QID) | INTRAVENOUS | Status: DC
  Filled 2015-10-01 (×2): qty 100

## 2015-10-01 MED ORDER — MENTHOL 3 MG MT LOZG: 1.0000 | LOZENGE | OROMUCOSAL | Status: DC | PRN

## 2015-10-01 MED ORDER — 0.9 % SODIUM CHLORIDE (POUR BTL) OPTIME
TOPICAL | Status: DC | PRN
  Administered 2015-10-01: 1000 mL

## 2015-10-01 MED ORDER — ASPIRIN EC 325 MG PO TBEC
325.0000 mg | DELAYED_RELEASE_TABLET | Freq: Every day | ORAL | Status: DC
  Administered 2015-10-03: 325 mg via ORAL
  Filled 2015-10-01 (×2): qty 1

## 2015-10-01 MED ORDER — OXYCODONE HCL 5 MG PO TABS: 5.0000 mg | ORAL_TABLET | Freq: Once | ORAL | Status: DC | PRN

## 2015-10-01 MED ORDER — FENTANYL CITRATE (PF) 250 MCG/5ML IJ SOLN
INTRAMUSCULAR | Status: AC
  Filled 2015-10-01: qty 5

## 2015-10-01 MED ORDER — CEFAZOLIN SODIUM 1-5 GM-% IV SOLN
1.0000 g | INTRAVENOUS | Status: AC
  Administered 2015-10-02: 1 g via INTRAVENOUS
  Filled 2015-10-01: qty 50

## 2015-10-01 MED ORDER — LIDOCAINE HCL (CARDIAC) 20 MG/ML IV SOLN
INTRAVENOUS | Status: DC | PRN
  Administered 2015-10-01: 40 mg via INTRAVENOUS

## 2015-10-01 MED ORDER — SUCCINYLCHOLINE CHLORIDE 20 MG/ML IJ SOLN
INTRAMUSCULAR | Status: DC | PRN
  Administered 2015-10-01: 100 mg via INTRAVENOUS

## 2015-10-01 MED ORDER — PHENOL 1.4 % MT LIQD: 1.0000 | OROMUCOSAL | Status: DC | PRN

## 2015-10-01 MED ORDER — CISATRACURIUM BESYLATE 20 MG/10ML IV SOLN
INTRAVENOUS | Status: AC
  Filled 2015-10-01: qty 10

## 2015-10-01 MED ORDER — ACETAMINOPHEN 325 MG PO TABS
650.0000 mg | ORAL_TABLET | Freq: Four times a day (QID) | ORAL | Status: DC | PRN
  Administered 2015-10-01 – 2015-10-03 (×7): 650 mg via ORAL
  Filled 2015-10-01 (×7): qty 2

## 2015-10-01 MED ORDER — ONDANSETRON HCL 4 MG/2ML IJ SOLN: 4.0000 mg | Freq: Once | INTRAMUSCULAR | Status: DC | PRN

## 2015-10-01 MED ORDER — NEOSTIGMINE METHYLSULFATE 10 MG/10ML IV SOLN
INTRAVENOUS | Status: DC | PRN
  Administered 2015-10-01: 3 mg via INTRAVENOUS

## 2015-10-01 MED ORDER — OXYCODONE HCL 5 MG/5ML PO SOLN
5.0000 mg | Freq: Once | ORAL | Status: DC | PRN
Start: 2015-10-01 — End: 2015-10-01

## 2015-10-01 MED ORDER — PROPOFOL 10 MG/ML IV BOLUS
INTRAVENOUS | Status: DC | PRN
  Administered 2015-10-01: 80 mg via INTRAVENOUS

## 2015-10-01 MED ORDER — FENTANYL CITRATE (PF) 100 MCG/2ML IJ SOLN
INTRAMUSCULAR | Status: DC | PRN
Start: 2015-10-01 — End: 2015-10-01
  Administered 2015-10-01 (×2): 50 ug via INTRAVENOUS

## 2015-10-01 MED ORDER — PHENYLEPHRINE HCL 10 MG/ML IJ SOLN
10.0000 mg | INTRAVENOUS | Status: DC | PRN
  Administered 2015-10-01: 30 ug/min via INTRAVENOUS

## 2015-10-01 MED ORDER — CEFAZOLIN SODIUM 1 G IJ SOLR
INTRAMUSCULAR | Status: DC | PRN
  Administered 2015-10-01: 1 g via INTRAMUSCULAR

## 2015-10-01 MED ORDER — PHENYLEPHRINE 40 MCG/ML (10ML) SYRINGE FOR IV PUSH (FOR BLOOD PRESSURE SUPPORT)
PREFILLED_SYRINGE | INTRAVENOUS | Status: AC
  Filled 2015-10-01: qty 10

## 2015-10-01 MED ORDER — FENTANYL CITRATE (PF) 100 MCG/2ML IJ SOLN: 25.0000 ug | INTRAMUSCULAR | Status: DC | PRN

## 2015-10-01 MED ORDER — CISATRACURIUM BESYLATE (PF) 10 MG/5ML IV SOLN
INTRAVENOUS | Status: DC | PRN
  Administered 2015-10-01: 4 mg via INTRAVENOUS

## 2015-10-01 MED ORDER — PROPOFOL 10 MG/ML IV BOLUS
INTRAVENOUS | Status: AC
  Filled 2015-10-01: qty 20

## 2015-10-01 MED ORDER — EPHEDRINE 5 MG/ML INJ
INTRAVENOUS | Status: AC
  Filled 2015-10-01: qty 10

## 2015-10-01 MED ORDER — ROCURONIUM BROMIDE 50 MG/5ML IV SOLN
INTRAVENOUS | Status: AC
  Filled 2015-10-01: qty 1

## 2015-10-01 MED ORDER — SODIUM CHLORIDE 0.9 % IV SOLN
INTRAVENOUS | Status: DC | PRN
  Administered 2015-10-01: 08:00:00 via INTRAVENOUS

## 2015-10-01 MED ORDER — GLYCOPYRROLATE 0.2 MG/ML IJ SOLN
INTRAMUSCULAR | Status: DC | PRN
  Administered 2015-10-01: 0.4 mg via INTRAVENOUS

## 2015-10-01 MED ORDER — CLOPIDOGREL BISULFATE 75 MG PO TABS
75.0000 mg | ORAL_TABLET | Freq: Every day | ORAL | Status: DC
  Administered 2015-10-03: 75 mg via ORAL
  Filled 2015-10-01: qty 1

## 2015-10-01 MED ORDER — EPHEDRINE SULFATE 50 MG/ML IJ SOLN
INTRAMUSCULAR | Status: DC | PRN
  Administered 2015-10-01: 10 mg via INTRAVENOUS
  Administered 2015-10-01 (×2): 15 mg via INTRAVENOUS
  Administered 2015-10-01: 10 mg via INTRAVENOUS

## 2015-10-01 SURGICAL SUPPLY — 42 items
APL SKNCLS STERI-STRIP NONHPOA (GAUZE/BANDAGES/DRESSINGS) ×1
BENZOIN TINCTURE PRP APPL 2/3 (GAUZE/BANDAGES/DRESSINGS) ×2 IMPLANT
BNDG COHESIVE 4X5 TAN STRL (GAUZE/BANDAGES/DRESSINGS) ×3 IMPLANT
BNDG GAUZE ELAST 4 BULKY (GAUZE/BANDAGES/DRESSINGS) ×3 IMPLANT
CLOSURE WOUND 1/2 X4 (GAUZE/BANDAGES/DRESSINGS) ×1
COVER PERINEAL POST (MISCELLANEOUS) ×3 IMPLANT
COVER SURGICAL LIGHT HANDLE (MISCELLANEOUS) ×3 IMPLANT
DRAPE STERI IOBAN 125X83 (DRAPES) ×3 IMPLANT
DRSG MEPILEX BORDER 4X4 (GAUZE/BANDAGES/DRESSINGS) ×4 IMPLANT
DURAPREP 26ML APPLICATOR (WOUND CARE) ×1 IMPLANT
ELECT REM PT RETURN 9FT ADLT (ELECTROSURGICAL) ×3
ELECTRODE REM PT RTRN 9FT ADLT (ELECTROSURGICAL) ×1 IMPLANT
GLOVE BIO SURGEON STRL SZ7 (GLOVE) ×6 IMPLANT
GLOVE BIO SURGEON STRL SZ7.5 (GLOVE) ×3 IMPLANT
GLOVE BIOGEL PI IND STRL 7.0 (GLOVE) ×2 IMPLANT
GLOVE BIOGEL PI IND STRL 8 (GLOVE) ×1 IMPLANT
GLOVE BIOGEL PI INDICATOR 7.0 (GLOVE) ×4
GLOVE BIOGEL PI INDICATOR 8 (GLOVE) ×2
GOWN STRL REUS W/ TWL LRG LVL3 (GOWN DISPOSABLE) ×2 IMPLANT
GOWN STRL REUS W/TWL LRG LVL3 (GOWN DISPOSABLE) ×6
GUIDEROD T2 3X1000 (ROD) ×2 IMPLANT
K-WIRE  3.2X450M STR (WIRE) ×2
K-WIRE 3.2X450M STR (WIRE) ×1
KIT NAIL LONG 10X380MM (Orthopedic Implant) ×2 IMPLANT
KIT ROOM TURNOVER OR (KITS) ×3 IMPLANT
KWIRE 3.2X450M STR (WIRE) IMPLANT
MANIFOLD NEPTUNE II (INSTRUMENTS) ×3 IMPLANT
NS IRRIG 1000ML POUR BTL (IV SOLUTION) ×3 IMPLANT
PACK GENERAL/GYN (CUSTOM PROCEDURE TRAY) ×3 IMPLANT
PAD ARMBOARD 7.5X6 YLW CONV (MISCELLANEOUS) ×5 IMPLANT
PAD CAST 4YDX4 CTTN HI CHSV (CAST SUPPLIES) ×1 IMPLANT
PADDING CAST COTTON 4X4 STRL (CAST SUPPLIES) ×3
SCREW LAG GAMMA 3 TI 10.5X80MM (Screw) ×2 IMPLANT
STRIP CLOSURE SKIN 1/2X4 (GAUZE/BANDAGES/DRESSINGS) ×1 IMPLANT
SUT MNCRL AB 4-0 PS2 18 (SUTURE) IMPLANT
SUT MON AB 2-0 CT1 27 (SUTURE) ×3 IMPLANT
SUT VIC AB 0 CT1 27 (SUTURE) ×3
SUT VIC AB 0 CT1 27XBRD ANBCTR (SUTURE) ×1 IMPLANT
SUT VIC AB 2-0 CT1 27 (SUTURE) ×3
SUT VIC AB 2-0 CT1 TAPERPNT 27 (SUTURE) IMPLANT
TOWEL OR 17X24 6PK STRL BLUE (TOWEL DISPOSABLE) ×3 IMPLANT
TOWEL OR 17X26 10 PK STRL BLUE (TOWEL DISPOSABLE) ×3 IMPLANT

## 2015-10-01 NOTE — H&P (View-Only) (Signed)
ORTHOPAEDIC CONSULTATION  REQUESTING PHYSICIAN: Pearson Grippe, MD  Chief Complaint: Left Hip pain  Assessment: Active Problems:   Dyslipidemia   Ischemic cardiomyopathy   ESRD on dialysis [facility]) - Dialysis on M/W/F    Coronary artery disease involving native coronary artery without angina pectoris   Hip fracture (HCC) - LEFT   Hypertension   Bradycardia   Hypokalemia Dementia.   Plan: Left Hip fracture with planned intertrochanteric nail surgery planned for 10/01/15. Plavix held. Weight Bearing Status: NWB until post op, then WBAT PT VTE px: per primary  HPI: Courtney [name] is a 82 y.o. female who tripped in the bathroom at about 0330 09/30/15.  Found by daughter.  With left hip pain seen in the ED, admitted to medicine, and referred to ortho for evaluation and treatment of left hip fracture.  No pain currently, but with intermittent burning sensation in left foot.  No CP, SOB.  Eating, drinking, and voiding w/o complaint.  Past Medical History  Diagnosis Date  . Myocardial infarction (HCC) 12-03-1995  . CAD in native artery 1997    CABG 1997, Last stress test 10/09 no ischemia  . S/P CABG x 5 1997    LIMA-LAD, SVG-OM, Seq SVG-RPDA-RPL  . Ischemic cardiomyopathy     EF by echo 11/2010 EF 35-45%, LA is moderately dilated, mod. MR, mod. to severe TR, RV systolic pressure 40-50 mmHg  . Moderate mitral regurgitation 11/2010  . Stroke, lacunar (HCC) 1994  . Carotid disease, bilateral (HCC)     Most recent Dopplers April 2014: Right carotid 50-69% (lower range);  lleft less than 50%.  . Hypertension, renal disease   . Dyslipidemia   . 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 Peripheral vascular disease (HCC) 03/2008    Stents to rt SFA,  Dopplers from April 2014: No significant change, last PV cath 03/29/08  -- fairly widely patent R. SFA stent. Left SFA less than 50%. Proximal but occluded in the mid vessel with reconstitution distally. Right PT and AP occluded as described previously an angiography. Left AP occluded also noted angiographically   . Anemia due to chronic renal failure treated with erythropoietin   . Vulvar lesion   . Osteoarthritis   . Hx of sickle cell trait   . H/O pulmonary fibrosis   . Shortness of breath     RELATED TO DIALYSIS -TOO MUCH FLUID  . Alzheimer's dementia   . Hypertension    Past Surgical History  Procedure Laterality Date  . Cholecystectomy    . Coronary artery bypass graft  1997    LIMA-LAD; seq VG-PDA&PLA; VG-OM  . Vulvar lesion removal  04/23/2011    Procedure: VULVAR LESION;  Surgeon: Laurette Schimke, MD;  Location: [facility] ORS;  Service: Gynecology;  Laterality: N/A;  Wide Local Excision Of Vulvar Lesion   . Femoral artery stent Right 03/2008    6 mm X 150 mm + 6 mm x 100 mm SMART stent;;   . Moles       from thigh s removed.  Marland Kitchen Av fistula placement Right 2001    AVF  . Abdominal aortagram N/A 07/18/2011    Procedure: ABDOMINAL Ronny Flurry;  Surgeon: Fransisco Hertz,  MD;  Location: [facility] CATH LAB;  Service: Cardiovascular;  Laterality: N/A;  . Shuntogram N/A 09/05/2011    Procedure: Betsey Amen;  Surgeon: Fransisco Hertz, MD;  Location: [facility] CATH LAB;  Service: Cardiovascular;  Laterality: N/A;  . Ligation of arteriovenous  fistula Right 09/21/2015    Procedure: LIGATION OF RIGHT UPPER ARM ARTERIOVENOUS  FISTULA;  Surgeon: Pryor Ochoa, MD;  Location: [facility] OR;  Service: Vascular;  Laterality: Right;   Social History   Social History  . Marital Status: Single    Spouse Name: N/A  . Number of Children: 1  . Years of Education: N/A   Occupational History  . Retired-dietary     from [employer]   Social History Main Topics  . Smoking status: Former Smoker    Types: Cigarettes    Quit date:  11/01/1991  . Smokeless tobacco: Never Used  . Alcohol Use: No  . Drug Use: No  . Sexual Activity: No   Other Topics Concern  . None   Social History Narrative   Single mother of one, grandmother of one. She is not exercising as much as he used to. She used to dance and a lot of walking. But of late she has not been as much. She states that when she turned 14 she became lazy.   She does not smoke and does not drink.   Again we discussed her goals of care and desires as far as resuscitation go. She continues to state that she wants to be DO NOT RESUSCITATE DO NOT INTUBATE, but does not have the form to state that. She also is not interested in invasive evaluation unless directed by symptoms. With that in mind, she is not in favor of continued noninvasive evaluation.   Family History  Problem Relation Age of Onset  . Colon cancer Neg Hx   . Anesthesia problems Neg Hx   . Heart disease Sister   . Sudden death Sister 75  . Heart disease Mother   . Sudden death Mother 48  . Sudden death Father 41  . Stroke Father   . Sudden death Brother 75  . Heart disease Brother    Allergies  Allergen Reactions  . Aspirin Itching  . Pletal [Cilostazol]     dizziness   Prior to Admission medications   Medication Sig Start Date End Date Taking? Authorizing Provider  Artificial Tear Ointment (DRY EYES OP) Place 1 drop into both eyes daily as needed (for dry eyes).    Yes Historical Provider, MD  atorvastatin (LIPITOR) 80 MG tablet Take 1 tablet (80 mg total) by mouth daily. 09/22/15  Yes Marykay Lex, MD  B Complex-C-Folic Acid (DIALYVITE 800 PO) Take 1 capsule by mouth daily.    Yes Historical Provider, MD  calcium acetate (PHOSLO) 667 MG capsule Take 667 mg by mouth 3 (three) times daily with meals.    Yes Historical Provider, MD  carvedilol (COREG) 12.5 MG tablet Take 1 tablet (12.5 mg total) by mouth 2 (two) times daily with a meal. 09/22/15  Yes Marykay Lex, MD  clopidogrel (PLAVIX) 75 MG  tablet TAKE ONE TABLET BY MOUTH ONCE DAILY Patient taking differently: TAKE ONE TABLET (75 mg) BY MOUTH ONCE DAILY 11/15/14  Yes Marykay Lex, MD  latanoprost (XALATAN) 0.005 % ophthalmic solution Place 1 drop into both eyes at bedtime.   Yes Historical Provider, MD  Travoprost, BAK Free, (TRAVATAN) 0.004 % SOLN ophthalmic solution Place 1 drop into both eyes at  bedtime.   Yes Historical Provider, MD  oxyCODONE-acetaminophen (PERCOCET/ROXICET) 5-325 MG tablet Take 1 tablet by mouth every 6 (six) hours as needed. Patient not taking: Reported on 09/30/2015 09/21/15   Lars MageEmma M Collins, PA-C   Dg Chest 1 View  09/30/2015  CLINICAL DATA:  Fall, left hip deformity EXAM: CHEST 1 VIEW COMPARISON:  04/18/2011 FINDINGS: Chronic interstitial markings/emphysematous changes with scarring in the right upper lobe. No focal consolidation. No pleural effusion or pneumothorax. The heart is normal in size. Postsurgical changes related to prior CABG. Median sternotomy. IMPRESSION: No evidence of acute cardiopulmonary disease. Electronically Signed   By: Charline BillsSriyesh  Krishnan M.D.   On: 09/30/2015 07:14   Ct Head Wo Contrast  09/30/2015  CLINICAL DATA:  Fall this morning. EXAM: CT HEAD WITHOUT CONTRAST CT CERVICAL SPINE WITHOUT CONTRAST TECHNIQUE: Multidetector CT imaging of the head and cervical spine was performed following the standard protocol without intravenous contrast. Multiplanar CT image reconstructions of the cervical spine were also generated. COMPARISON:  None. FINDINGS: CT HEAD FINDINGS The ventricles are normal in configuration. There is ventricular and sulcal enlargement reflecting mild, diffuse atrophy. There are no parenchymal masses or mass effect. Patchy white matter hypoattenuation is noted consistent with moderate chronic microvascular ischemic change. There is no evidence of a recent cortical infarct. There are no extra-axial masses or abnormal fluid collections. There is no intracranial hemorrhage. The  visualized sinuses and mastoid air cells are clear. No skull fracture. CT CERVICAL SPINE FINDINGS No fracture. Slight, grade 1, anterolisthesis of C4 on C5. There is straightening of the normal cervical lordosis. Mild loss of disc height at C3-C4. Moderate loss disc height at C4-C5, C5-C6 and C6-C7. There is facet degenerative change most evident on the right at C3-C4 and C4-C5. The bones are diffusely demineralized. Soft tissues show vascular calcifications but are otherwise unremarkable. Lung apices demonstrate emphysema and scarring. IMPRESSION: HEAD CT:  No acute intracranial abnormalities.  No skull fracture. CERVICAL CT:  No fracture or acute finding. Electronically Signed   By: Amie Portlandavid  Ormond M.D.   On: 09/30/2015 07:11   Ct Cervical Spine Wo Contrast  09/30/2015  CLINICAL DATA:  Fall this morning. EXAM: CT HEAD WITHOUT CONTRAST CT CERVICAL SPINE WITHOUT CONTRAST TECHNIQUE: Multidetector CT imaging of the head and cervical spine was performed following the standard protocol without intravenous contrast. Multiplanar CT image reconstructions of the cervical spine were also generated. COMPARISON:  None. FINDINGS: CT HEAD FINDINGS The ventricles are normal in configuration. There is ventricular and sulcal enlargement reflecting mild, diffuse atrophy. There are no parenchymal masses or mass effect. Patchy white matter hypoattenuation is noted consistent with moderate chronic microvascular ischemic change. There is no evidence of a recent cortical infarct. There are no extra-axial masses or abnormal fluid collections. There is no intracranial hemorrhage. The visualized sinuses and mastoid air cells are clear. No skull fracture. CT CERVICAL SPINE FINDINGS No fracture. Slight, grade 1, anterolisthesis of C4 on C5. There is straightening of the normal cervical lordosis. Mild loss of disc height at C3-C4. Moderate loss disc height at C4-C5, C5-C6 and C6-C7. There is facet degenerative change most evident on the right  at C3-C4 and C4-C5. The bones are diffusely demineralized. Soft tissues show vascular calcifications but are otherwise unremarkable. Lung apices demonstrate emphysema and scarring. IMPRESSION: HEAD CT:  No acute intracranial abnormalities.  No skull fracture. CERVICAL CT:  No fracture or acute finding. Electronically Signed   By: Amie Portlandavid  Ormond M.D.   On: 09/30/2015 07:11  Dg Hip Unilat W Or W/o Pelvis 2-3 Views Left  09/30/2015  CLINICAL DATA:  Fall, left hip deformity EXAM: DG HIP (WITH OR WITHOUT PELVIS) 2-3V LEFT COMPARISON:  None. FINDINGS: Intertrochanteric left hip fracture. Varus angulation with foreshortening. Bilateral hip joint spaces are preserved. Bony pelvis appears intact. Vascular calcifications. Left iliac stent. Additional vascular stent in the right medial thigh. IMPRESSION: Intertrochanteric left hip fracture, as above. Electronically Signed   By: Charline Bills M.D.   On: 09/30/2015 07:16    Positive ROS: All other systems have been reviewed and were otherwise negative with the exception of those mentioned in the HPI and as above.  Objective: Labs cbc  Recent Labs  09/30/15 0520  WBC 7.5  HGB 11.1*  HCT 34.8*  PLT 154    Recent Labs  09/30/15 0520  NA 139  K 3.2*  CL 98*  CO2 30  GLUCOSE 134*  BUN 20  CREATININE 5.05*  CALCIUM 9.1    Physical Exam: Filed Vitals:   09/30/15 0815 09/30/15 0943  BP: 159/63 197/49  Pulse: 54 57  Temp:  98.2 F (36.8 C)  Resp:  19   General: Thin, Alert, no acute distress.  Daughter at bedside. Cardiovascular: No pedal edema Respiratory: No cyanosis, no use of accessory musculature GI: No organomegaly, abdomen is soft and non-tender Skin: No lesions in the area of chief complaint other than those listed below in MSK exam.  Neurologic: Sensation intact distally Psychiatric: Patient is alert with normal mood and affect.  She does have dementia.  MUSCULOSKELETAL:  Left Hip: No erythema, lesion, or sign of infection.   Leg externally rotated.  Wiggles toes, decreased EHL movement, FHL intact.  Distal sensation intact.  NVI.  DP present.  Foot warm.  Skin dry.   Other extremities are atraumatic with painless ROM and NVI.   Albina Billet III PA-C 09/30/2015 4:55 PM

## 2015-10-01 NOTE — Anesthesia Preprocedure Evaluation (Addendum)
Anesthesia Evaluation  Patient identified by MRN, date of birth, ID band Patient awake    Reviewed: Allergy & Precautions, NPO status , Patient's Chart, lab work & pertinent test results  Airway Mallampati: II  TM Distance: >3 FB     Dental  (+) Edentulous Upper, Edentulous Lower   Pulmonary former smoker,    breath sounds clear to auscultation + decreased breath sounds      Cardiovascular hypertension,  Rhythm:Regular Rate:Abnormal     Neuro/Psych    GI/Hepatic   Endo/Other    Renal/GU      Musculoskeletal   Abdominal   Peds  Hematology   Anesthesia Other Findings   Reproductive/Obstetrics                            Anesthesia Physical Anesthesia Plan  ASA: III  Anesthesia Plan: General   Post-op Pain Management:    Induction: Intravenous  Airway Management Planned: Oral ETT  Additional Equipment:   Intra-op Plan:   Post-operative Plan: Extubation in OR  Informed Consent: I have reviewed the patients History and Physical, chart, labs and discussed the procedure including the risks, benefits and alternatives for the proposed anesthesia with the patient or authorized representative who has indicated his/her understanding and acceptance.     Plan Discussed with: CRNA and Anesthesiologist  Anesthesia Plan Comments:         Anesthesia Quick Evaluation

## 2015-10-01 NOTE — Progress Notes (Signed)
[facility] removed [facility] and [facility]. Daughter at the bedside. Nursing will continue to monitor.

## 2015-10-01 NOTE — Op Note (Signed)
DATE OF SURGERY:  10/01/2015  TIME: 9:07 AM  PATIENT NAME:  Courtney [name]  AGE: 82 y.o.  PRE-OPERATIVE DIAGNOSIS:  intertrochanteric fracture left hip  POST-OPERATIVE DIAGNOSIS:  SAME  PROCEDURE:  INTRAMEDULLARY (IM) NAIL INTERTROCHANTRIC  SURGEON:  Eusebio Blazejewski D  ASSISTANT:  Aquilla HackerHenry Martensen, PA-C, She was present and scrubbed throughout the case, critical for completion in a timely fashion, and for retraction, instrumentation, and closure.   OPERATIVE IMPLANTS: [employer] Gamma Nail  PREOPERATIVE INDICATIONS:  Courtney [name] is a 82 y.o. year old who fell and suffered a hip fracture. She was brought into the ER and then admitted and optimized and then elected for surgical intervention.    The risks benefits and alternatives were discussed with the patient including but not limited to the risks of nonoperative treatment, versus surgical intervention including infection, bleeding, nerve injury, malunion, nonunion, hardware prominence, hardware failure, need for hardware removal, blood clots, cardiopulmonary complications, morbidity, mortality, among others, and they were willing to proceed.    OPERATIVE PROCEDURE:  The patient was brought to the operating room and placed in the supine position. General anesthesia was administered. She was placed on the fracture table.  Closed reduction was performed under C-arm guidance. Time out was then performed after sterile prep and drape. She received preoperative antibiotics.  Incision was made proximal to the greater trochanter. A guidewire was placed in the appropriate position. Confirmation was made on AP and lateral views. The above-named nail was opened. I opened the proximal femur with a reamer. I then placed the nail by hand easily down. I did not need to ream the femur.  Once the nail was completely seated, I placed a guidepin into the femoral head into the center center position. I measured the length, and then reamed the lateral  cortex and up into the head. I then placed the lag screw. Slight compression was applied. Anatomic fixation achieved. Bone quality was mediocre.  I then secured the proximal interlocking bolt, and took off a half a turn, and then removed the instruments, and took final C-arm pictures AP and lateral the entire length of the leg.   Anatomic reconstruction was achieved, and the wounds were irrigated copiously and closed with Vicryl followed by staples and sterile gauze for the skin. The patient was awakened and returned to PACU in stable and satisfactory condition. There no complications and the patient tolerated the procedure well.  She will be weightbearing as tolerated, and will be on chemical dvt px  for a period of four weeks after discharge.   Margarita Ranaimothy Marvene Strohm, M.D.    This note was generated using a template and dragon dictation system. In light of that, I have reviewed the note and all aspects of it are applicable to this case. Any dictation errors are due to the computerized dictation system.

## 2015-10-01 NOTE — Anesthesia Procedure Notes (Signed)
Procedure Name: Intubation Date/Time: 10/01/2015 8:13 AM Performed by: Rosiland OzMEYERS, Adrieanna Boteler Pre-anesthesia Checklist: Patient identified, Emergency Drugs available, Suction available, Patient being monitored and Timeout performed Patient Re-evaluated:Patient Re-evaluated prior to inductionOxygen Delivery Method: Circle system utilized Preoxygenation: Pre-oxygenation with 100% oxygen Intubation Type: IV induction Ventilation: Mask ventilation without difficulty Laryngoscope Size: Miller and 2 Grade View: Grade I Tube type: Oral Tube size: 7.5 mm Number of attempts: 1 Airway Equipment and Method: Stylet Placement Confirmation: ETT inserted through vocal cords under direct vision,  positive ETCO2 and breath sounds checked- equal and bilateral Secured at: 20 cm Tube secured with: Tape Dental Injury: Teeth and Oropharynx as per pre-operative assessment

## 2015-10-01 NOTE — Interval H&P Note (Signed)
History and Physical Interval Note:  10/01/2015 8:00 AM  Courtney [name]  has presented today for surgery, with the diagnosis of intertrochanteric fracture left hip  The various methods of treatment have been discussed with the patient and family. After consideration of risks, benefits and other options for treatment, the patient has consented to  Procedure(s): INTRAMEDULLARY (IM) NAIL INTERTROCHANTRIC (Left) as a surgical intervention .  The patient's history has been reviewed, patient examined, no change in status, stable for surgery.  I have reviewed the patient's chart and labs.  Questions were answered to the patient's satisfaction.     Demonte Dobratz D

## 2015-10-01 NOTE — Consult Note (Signed)
Renal Service Consult Note [facility]  Courtney BernBertha M [name] 10/01/2015 [name],[name] Requesting Physician:  Dr Courtney KatzPatel, Courtney CharityP.   Reason for Consult:  ESRD pt with hip fracture HPI: The patient is a 82 y.o. year-old with history of CAD/ MI / CABG/ ICM, CVA, HTN, mild dementia, PVD and ESRD on HD x 6598yrs, esrd due to HTN.  Pt fell and broke her L hip, had ORIF today.  STable postop , alert.  Asked to see for ESRD.    Patient on HD 6 yrs, using L AVF.  No recent HD issues.  No tob/ etoh, former smoker.  Lives w daughter and her family.  One daughter, never married.  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  Now likes to do puzzles and crosswords.  Dresses and feeds herself, does all ADL's. Has memory problems but otherwise active.      ROS  denies CP, SOB, fevers, chills , sweats  no joint pain   no HA  no blurry vision  no rash  no diarrhea  no nausea/ vomiting  no dysuria  no difficulty voiding  no change in urine color    Past Medical History  Past Medical History  Diagnosis Date  . Myocardial infarction (HCC) 12-03-1995  . CAD in native artery 1997    CABG 1997, Last stress test 10/09 no ischemia  . S/P CABG x 5 1997    LIMA-LAD, SVG-OM, Seq SVG-RPDA-RPL  . Ischemic cardiomyopathy     EF by echo 11/2010 EF 35-45%, LA is moderately dilated, mod. MR, mod. to severe TR, RV systolic pressure 40-50 mmHg  . Moderate mitral regurgitation 11/2010  . Stroke, lacunar (HCC) 1994  . Carotid disease, bilateral (HCC)     Most recent Dopplers April 2014: Right carotid 50-69% (lower range);  lleft less than 50%.  . Hypertension, renal disease   . Dyslipidemia   . 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 Peripheral vascular  disease (HCC) 03/2008    Stents to rt SFA, Dopplers from April 2014: No significant change, last PV cath 03/29/08  -- fairly widely patent R. SFA stent. Left SFA less than 50%. Proximal but occluded in the mid vessel with reconstitution distally. Right PT and AP occluded as described previously an angiography. Left AP occluded also noted angiographically   . Anemia due to chronic renal failure treated with erythropoietin   . Vulvar lesion   . Osteoarthritis   . Hx of sickle cell trait   . H/O pulmonary fibrosis   . Shortness of breath     RELATED TO DIALYSIS -TOO MUCH FLUID  . Alzheimer's dementia   . Hypertension    Past Surgical History  Past Surgical History  Procedure Laterality Date  . Cholecystectomy    . Coronary artery bypass graft  1997    LIMA-LAD; seq VG-PDA&PLA; VG-OM  . Vulvar lesion removal  04/23/2011    Procedure: VULVAR LESION;  Surgeon: Laurette SchimkeWendy Brewster, MD;  Location: [facility] ORS;  Service: Gynecology;  Laterality: N/A;  Wide Local Excision Of Vulvar Lesion   . Femoral artery stent Right 03/2008    6 mm X 150 mm + 6 mm x 100 mm SMART stent;;   .  Moles       from thigh s removed.  Marland Kitchen Av fistula placement Right 2001    AVF  . Abdominal aortagram N/A 07/18/2011    Procedure: ABDOMINAL Ronny Flurry;  Surgeon: Fransisco Hertz, MD;  Location: [facility] CATH LAB;  Service: Cardiovascular;  Laterality: N/A;  . Shuntogram N/A 09/05/2011    Procedure: Betsey Amen;  Surgeon: Fransisco Hertz, MD;  Location: [facility] CATH LAB;  Service: Cardiovascular;  Laterality: N/A;  . Ligation of arteriovenous  fistula Right 09/21/2015    Procedure: LIGATION OF RIGHT UPPER ARM ARTERIOVENOUS  FISTULA;  Surgeon: Pryor Ochoa, MD;  Location: [facility] OR;  Service: Vascular;  Laterality: Right;   Family History  Family History  Problem Relation Age of Onset  . Colon cancer Neg Hx   . Anesthesia problems Neg Hx   . Heart disease Sister   . Sudden death Sister 53  . Heart disease Mother   . Sudden death Mother 29  . Sudden  death Father 74  . Stroke Father   . Sudden death Brother 62  . Heart disease Brother    Social History  reports that she quit smoking about 23 years ago. Her smoking use included Cigarettes. She has never used smokeless tobacco. She reports that she does not drink alcohol or use illicit drugs. Allergies  Allergies  Allergen Reactions  . Aspirin Itching  . Pletal [Cilostazol]     dizziness   Home medications Prior to Admission medications   Medication Sig Start Date End Date Taking? Authorizing Provider  Artificial Tear Ointment (DRY EYES OP) Place 1 drop into both eyes daily as needed (for dry eyes).    Yes Historical Provider, MD  atorvastatin (LIPITOR) 80 MG tablet Take 1 tablet (80 mg total) by mouth daily. 09/22/15  Yes Marykay Lex, MD  B Complex-C-Folic Acid (DIALYVITE 800 PO) Take 1 capsule by mouth daily.    Yes Historical Provider, MD  calcium acetate (PHOSLO) 667 MG capsule Take 667 mg by mouth 3 (three) times daily with meals.    Yes Historical Provider, MD  carvedilol (COREG) 12.5 MG tablet Take 1 tablet (12.5 mg total) by mouth 2 (two) times daily with a meal. 09/22/15  Yes Marykay Lex, MD  clopidogrel (PLAVIX) 75 MG tablet TAKE ONE TABLET BY MOUTH ONCE DAILY Patient taking differently: TAKE ONE TABLET (75 mg) BY MOUTH ONCE DAILY 11/15/14  Yes Marykay Lex, MD  latanoprost (XALATAN) 0.005 % ophthalmic solution Place 1 drop into both eyes at bedtime.   Yes Historical Provider, MD  Travoprost, BAK Free, (TRAVATAN) 0.004 % SOLN ophthalmic solution Place 1 drop into both eyes at bedtime.   Yes Historical Provider, MD  oxyCODONE-acetaminophen (PERCOCET/ROXICET) 5-325 MG tablet Take 1 tablet by mouth every 6 (six) hours as needed. Patient not taking: Reported on 09/30/2015 09/21/15   Lars Mage, PA-C   Liver Function Tests No results for input(s): AST, ALT, ALKPHOS, BILITOT, PROT, ALBUMIN in the last 168 hours. No results for input(s): LIPASE, AMYLASE in the last 168  hours. CBC  Recent Labs Lab 09/30/15 0520 10/01/15 0515  WBC 7.5 7.3  NEUTROABS 6.3  --   HGB 11.1* 11.5*  HCT 34.8* 35.6*  MCV 84.7 84.0  PLT 154 177   Basic Metabolic Panel  Recent Labs Lab 09/30/15 0520 10/01/15 0515  NA 139 137  K 3.2* 4.4  CL 98* 96*  CO2 30 28  GLUCOSE 134* 125*  BUN 20 37*  CREATININE 5.05*  7.06*  CALCIUM 9.1 9.3    Filed Vitals:   10/01/15 0927 10/01/15 0944 10/01/15 0958 10/01/15 1028  BP: 101/44 120/45 119/46 104/42  Pulse: 72 53 54 53  Temp: 97.4 F (36.3 C)  97.3 F (36.3 C) 97.4 F (36.3 C)  TempSrc:    Oral  Resp: 22 18 12 12   SpO2: 100% 100% 98% 98%   Exam Thin AAF no distres No rash, cyanosis or gangrene Sclera anicteric, throat clear  No jvd or bruits Chest clear bilat RRR soft SEM no RG Abd soft ntnd no mass or ascites +bs GU defer MS no joint deformity Ext no LE edema / no wounds or ulcers L hip w some mild swelling and dressing in place Neuro is alert, nonfocal LUA AVF +bruit  Dialysis:  [facility]   3.5h  37kg   2/2.25 bath  P4   Hep 1400  LUA AVF  Assessment: 1  Fall/ L hip fx sp ORIF 2  ESRD 3  CAD/ CABG/ ICM - on coreg 4  Hx CVA on plavix 5  Vol is at dry wt 6  Dementia, mild- active at home   Plan - HD Monday, min UF, no hep  Vinson Moselle MD [facility] pager [phone]    cell [phone] 10/01/2015, 3:45 PM

## 2015-10-01 NOTE — Progress Notes (Signed)
[facility] Progress Note  Patient: Courtney [name] ZOX:[id number]   PCP: [name], MD DOB: [birth year]   DOA: 09/30/2015   DOS: 10/01/2015   Date of Service: the patient was seen and examined on 10/01/2015  Subjective: Patient was seen after the surgery. Tolerated procedure well and did not have any major complication during the surgery. Does not have any acute complaint of pain. No nausea or vomiting. Nutrition: Was nothing by mouth last night  Brief hospital course: Patient was admitted on 09/30/2015, with complaint of mechanical fall, was found to have left hip fracture and underwent left intramedullary nail surgery on 10/01/2015. Currently further plan is continue postoperative recovery.  Assessment and Plan: 1. Hip fracture (HCC) Status post left intramedullary nail surgery 10/01/2015. Tolerated well. Pain well controlled. PTOT consulted. We'll monitor pain control and recheck CBC and BMP in the morning. 4 weeks of DVT prophylaxis, surgery will decide on the regimen.  2. ESRD on hemodialysis Monday Wednesday Friday. Nephrology consulted for hemodialysis on Monday. Currently stable.  3. Ischemic cardiomyopathy. Echocardiogram 2009 moderate MR with EF 35-45%. History of coronary artery disease History of peripheral vascular disease.  Continue carvedilol. Was on Plavix which is also continued. Aspirin added per surgery. Lipitor 80 continue. Patient was cleared for surgery on admission. We'll check echocardiogram.  4. History of dementia.  monitor for delirium.  Pain management: When necessary Norco, when necessary morphine, Activity: Pending consultation from physical therapy Bowel regimen: last BM prior to arrival Diet: Cardiac diet DVT Prophylaxis: Mechanical compression  Advance goals of care discussion: DNR/DNI  Family Communication: family was present at bedside, at the time of interview. The pt provided permission to discuss medical plan with the  family. Opportunity was given to ask question and all questions were answered satisfactorily.   Disposition:  Discharge to likely SNF Expected discharge date: 10/03/2015  Consultants: Orthopedics Procedures: Status post intramedullary nail placement  Antibiotics: Anti-infectives    Start     Dose/Rate Route Frequency Ordered Stop   10/02/15 0830  ceFAZolin (ANCEF) IVPB 1 g/50 mL premix     1 g 100 mL/hr over 30 Minutes Intravenous Every 24 hours 10/01/15 1044 10/03/15 0829   10/01/15 1430  ceFAZolin (ANCEF) IVPB 2g/100 mL premix  Status:  Discontinued     2 g 200 mL/hr over 30 Minutes Intravenous Every 6 hours 10/01/15 1029 10/01/15 1044   10/01/15 0800  ceFAZolin (ANCEF) IVPB 2g/100 mL premix  Status:  Discontinued     2 g 200 mL/hr over 30 Minutes Intravenous On call to O.R. 09/30/15 2231 10/01/15 1027        Intake/Output Summary (Last 24 hours) at 10/01/15 1532 Last data filed at 10/01/15 0932  Gross per 24 hour  Intake    400 ml  Output    150 ml  Net    250 ml   There were no vitals filed for this visit.  Objective: Physical Exam: Filed Vitals:   10/01/15 0927 10/01/15 0944 10/01/15 0958 10/01/15 1028  BP: 101/44 120/45 119/46 104/42  Pulse: 72 53 54 53  Temp: 97.4 F (36.3 C)  97.3 F (36.3 C) 97.4 F (36.3 C)  TempSrc:    Oral  Resp: SpO2: 100% 100% 98% 98%    General: Alert, Awake and Oriented to Time, Place and Person. Appear in mild distress Eyes: PERRL, Conjunctiva normal ENT: Oral Mucosa clear moist Neck: no JVD, no Abnormal Mass Or lumps Cardiovascular: S1 and S2 Present,  aortic systolic  Murmur, Peripheral Pulses Present Respiratory: Bilateral Air entry equal and Decreased, Clear to Auscultation, no Crackles, no wheezes Abdomen: Bowel Sound present, Soft and no tenderness Skin: no redness, no Rash  Extremities: no Pedal edema, no calf tenderness Neurologic: Grossly no focal neuro deficit. Bilaterally Equal motor strength  Data  Reviewed: CBC:  Recent Labs Lab 09/30/15 0520 10/01/15 0515  WBC 7.5 7.3  NEUTROABS 6.3  --   HGB 11.1* 11.5*  HCT 34.8* 35.6*  MCV 84.7 84.0  PLT 154 177   Basic Metabolic Panel:  Recent Labs Lab 09/30/15 0520 10/01/15 0515  NA 139 137  K 3.2* 4.4  CL 98* 96*  CO2 30 28  GLUCOSE 134* 125*  BUN 20 37*  CREATININE 5.05* 7.06*  CALCIUM 9.1 9.3    Liver Function Tests: No results for input(s): AST, ALT, ALKPHOS, BILITOT, PROT, ALBUMIN in the last 168 hours. No results for input(s): LIPASE, AMYLASE in the last 168 hours. No results for input(s): AMMONIA in the last 168 hours. Coagulation Profile: No results for input(s): INR, PROTIME in the last 168 hours. Cardiac Enzymes: No results for input(s): CKTOTAL, CKMB, CKMBINDEX, TROPONINI in the last 168 hours. BNP (last 3 results) No results for input(s): PROBNP in the last 8760 hours.  CBG: No results for input(s): GLUCAP in the last 168 hours.  Studies: Pelvis Portable  10/01/2015  CLINICAL DATA:  Postop day 0 ORIF intertrochanteric left femoral neck fracture. EXAM: PORTABLE PELVIS 1-2 VIEWS COMPARISON:  Intraoperative left hip x-rays earlier today 8:49 a.m. and left hip x-rays yesterday. FINDINGS: Portable AP views of the pelvis and left femur were obtained. ORIF of the intertrochanteric left femoral neck fracture with intramedullary nail and compression screw. Near anatomic alignment. No acute complicating features. IMPRESSION: Near anatomic alignment post ORIF of the intertrochanteric left femoral neck fracture. No acute complicating features. Electronically Signed   By: Hulan Saashomas  Lawrence M.D.   On: 10/01/2015 11:58   Dg Hip Operative Unilat W Or W/o Pelvis Left  10/01/2015  CLINICAL DATA:  Status post hip repair. FLUOROSCOPY TIME:  45 seconds. Images: 3. EXAM: OPERATIVE left HIP (WITH PELVIS IF PERFORMED) 3 VIEWS TECHNIQUE: Fluoroscopic spot image(s) were submitted for interpretation post-operatively. COMPARISON:  Sep 30, 2015 FINDINGS: A gamma nail and rod have been placed across the left hip fracture. Hardware is in good position. IMPRESSION: Appropriate placement of hardware across the left hip fracture. Electronically Signed   By: Gerome Samavid  Gockley III M.D   On: 10/01/2015 11:12     Scheduled Meds: . aspirin EC  325 mg Oral Daily  . atorvastatin  80 mg Oral Daily  . calcium acetate  667 mg Oral TID WC  . carvedilol  12.5 mg Oral BID WC  . [START ON 10/02/2015]  ceFAZolin (ANCEF) IV  1 g Intravenous Q24H  . [START ON 10/02/2015] clopidogrel  75 mg Oral Daily  . feeding supplement (NEPRO CARB STEADY)  237 mL Oral BID BM  . latanoprost  1 drop Both Eyes QHS  . potassium chloride  30 mEq Oral Once   Continuous Infusions:  PRN Meds: acetaminophen **OR** acetaminophen, bisacodyl, bisacodyl, HYDROcodone-acetaminophen, menthol-cetylpyridinium **OR** phenol, morphine injection, ondansetron **OR** ondansetron (ZOFRAN) IV, senna-docusate  Time spent: 30 minutes  Author: Lynden OxfordPranav Nayson Traweek, MD [facility] Pager: [phone] 10/01/2015 3:32 PM  If 7PM-7AM, please contact night-coverage at [email or web address], password [email or web address]

## 2015-10-01 NOTE — Anesthesia Postprocedure Evaluation (Signed)
Anesthesia Post Note  Patient: Courtney [name]  Procedure(s) Performed: Procedure(s) (LRB): INTRAMEDULLARY (IM) NAIL INTERTROCHANTRIC (Left)  Patient location during evaluation: PACU Anesthesia Type: General Level of consciousness: awake, awake and alert and oriented Pain management: pain level controlled Vital Signs Assessment: post-procedure vital signs reviewed and stable Respiratory status: spontaneous breathing, respiratory function stable and nonlabored ventilation Cardiovascular status: blood pressure returned to baseline Anesthetic complications: no    Last Vitals:  Filed Vitals:   10/01/15 0958 10/01/15 1028  BP: 119/46 104/42  Pulse: 54 53  Temp: 36.3 C 36.3 C  Resp: 12 12    Last Pain:  Filed Vitals:   10/01/15 1028  PainSc: 1                  Burnice Vassel COKER

## 2015-10-01 NOTE — Transfer of Care (Signed)
Immediate Anesthesia Transfer of Care Note  Patient: Courtney [name]  Procedure(s) Performed: Procedure(s): INTRAMEDULLARY (IM) NAIL INTERTROCHANTRIC (Left)  Patient Location: PACU  Anesthesia Type:General  Level of Consciousness: awake, alert  and patient cooperative  Airway & Oxygen Therapy: Patient Spontanous Breathing and Patient connected to face mask oxygen  Post-op Assessment: Report given to RN, Post -op Vital signs reviewed and stable and Patient moving all extremities X 4  Post vital signs: Reviewed and stable  Last Vitals:  Filed Vitals:   09/30/15 1957 10/01/15 0446  BP: 158/52 159/49  Pulse: 59 58  Temp: 36.7 C 36.6 C  Resp: 16 16    Last Pain:  Filed Vitals:   10/01/15 0856  PainSc: 0-No pain         Complications: No apparent anesthesia complications

## 2015-10-02 ENCOUNTER — Inpatient Hospital Stay (HOSPITAL_COMMUNITY): Payer: Medicare Other

## 2015-10-02 DIAGNOSIS — I255 Ischemic cardiomyopathy: Secondary | ICD-10-CM

## 2015-10-02 DIAGNOSIS — E785 Hyperlipidemia, unspecified: Secondary | ICD-10-CM

## 2015-10-02 DIAGNOSIS — S72002S Fracture of unspecified part of neck of left femur, sequela: Secondary | ICD-10-CM

## 2015-10-02 DIAGNOSIS — Z992 Dependence on renal dialysis: Secondary | ICD-10-CM

## 2015-10-02 DIAGNOSIS — N186 End stage renal disease: Secondary | ICD-10-CM

## 2015-10-02 LAB — CBC WITH DIFFERENTIAL/PLATELET
Basophils Absolute: 0 10*3/uL (ref 0.0–0.1)
Basophils Relative: 0 %
EOS ABS: 0.1 10*3/uL (ref 0.0–0.7)
EOS PCT: 1 %
HCT: 28.5 % — ABNORMAL LOW (ref 36.0–46.0)
Hemoglobin: 9.2 g/dL — ABNORMAL LOW (ref 12.0–15.0)
LYMPHS ABS: 0.8 10*3/uL (ref 0.7–4.0)
LYMPHS PCT: 11 %
MCH: 26.9 pg (ref 26.0–34.0)
MCHC: 32.3 g/dL (ref 30.0–36.0)
MCV: 83.3 fL (ref 78.0–100.0)
MONO ABS: 0.6 10*3/uL (ref 0.1–1.0)
MONOS PCT: 8 %
Neutro Abs: 5.7 10*3/uL (ref 1.7–7.7)
Neutrophils Relative %: 80 %
PLATELETS: 143 10*3/uL — AB (ref 150–400)
RBC: 3.42 MIL/uL — AB (ref 3.87–5.11)
RDW: 15.3 % (ref 11.5–15.5)
WBC: 7.2 10*3/uL (ref 4.0–10.5)

## 2015-10-02 LAB — RENAL FUNCTION PANEL
Albumin: 2.6 g/dL — ABNORMAL LOW (ref 3.5–5.0)
Anion gap: 15 (ref 5–15)
BUN: 48 mg/dL — AB (ref 6–20)
CHLORIDE: 99 mmol/L — AB (ref 101–111)
CO2: 23 mmol/L (ref 22–32)
Calcium: 8.5 mg/dL — ABNORMAL LOW (ref 8.9–10.3)
Creatinine, Ser: 8.48 mg/dL — ABNORMAL HIGH (ref 0.44–1.00)
GFR calc Af Amer: 4 mL/min — ABNORMAL LOW (ref >60)
GFR, EST NON AFRICAN AMERICAN: 4 mL/min — AB (ref >60)
Glucose, Bld: 90 mg/dL (ref 65–99)
POTASSIUM: 4.3 mmol/L (ref 3.5–5.1)
Phosphorus: 7.6 mg/dL — ABNORMAL HIGH (ref 2.5–4.6)
Sodium: 137 mmol/L (ref 135–145)

## 2015-10-02 LAB — MAGNESIUM: MAGNESIUM: 2 mg/dL (ref 1.7–2.4)

## 2015-10-02 LAB — BASIC METABOLIC PANEL
BUN: 48 mg/dL — AB (ref 4–21)
Creatinine: 8.5 mg/dL — AB (ref 0.5–1.1)
GLUCOSE: 90 mg/dL
Sodium: 137 mmol/L (ref 137–147)

## 2015-10-02 LAB — ECHOCARDIOGRAM COMPLETE

## 2015-10-02 MED ORDER — PENTAFLUOROPROP-TETRAFLUOROETH EX AERO: 1.0000 "application " | INHALATION_SPRAY | CUTANEOUS | Status: DC | PRN

## 2015-10-02 MED ORDER — ALTEPLASE 2 MG IJ SOLR: 2.0000 mg | Freq: Once | INTRAMUSCULAR | Status: DC | PRN

## 2015-10-02 MED ORDER — DARBEPOETIN ALFA 60 MCG/0.3ML IJ SOSY
PREFILLED_SYRINGE | INTRAMUSCULAR | Status: AC
  Filled 2015-10-02: qty 0.3

## 2015-10-02 MED ORDER — SODIUM CHLORIDE 0.9 % IV SOLN: 62.5000 mg | INTRAVENOUS | Status: DC

## 2015-10-02 MED ORDER — SODIUM CHLORIDE 0.9 % IV SOLN: 100.0000 mL | INTRAVENOUS | Status: DC | PRN

## 2015-10-02 MED ORDER — LIDOCAINE HCL (PF) 1 % IJ SOLN: 5.0000 mL | INTRAMUSCULAR | Status: DC | PRN

## 2015-10-02 MED ORDER — DOXERCALCIFEROL 4 MCG/2ML IV SOLN
2.0000 ug | INTRAVENOUS | Status: DC
  Administered 2015-10-02: 2 ug via INTRAVENOUS

## 2015-10-02 MED ORDER — DOXERCALCIFEROL 4 MCG/2ML IV SOLN
INTRAVENOUS | Status: AC
  Filled 2015-10-02: qty 2

## 2015-10-02 MED ORDER — DARBEPOETIN ALFA 60 MCG/0.3ML IJ SOSY
60.0000 ug | PREFILLED_SYRINGE | INTRAMUSCULAR | Status: DC
  Administered 2015-10-02: 60 ug via INTRAVENOUS

## 2015-10-02 MED ORDER — HEPARIN SODIUM (PORCINE) 1000 UNIT/ML DIALYSIS
1000.0000 [IU] | INTRAMUSCULAR | Status: DC | PRN
  Filled 2015-10-02: qty 1

## 2015-10-02 MED ORDER — CALCIUM ACETATE (PHOS BINDER) 667 MG PO CAPS
1334.0000 mg | ORAL_CAPSULE | Freq: Three times a day (TID) | ORAL | Status: DC
  Administered 2015-10-03 (×2): 1334 mg via ORAL
  Filled 2015-10-02 (×2): qty 2

## 2015-10-02 MED ORDER — LIDOCAINE-PRILOCAINE 2.5-2.5 % EX CREA: 1.0000 "application " | TOPICAL_CREAM | CUTANEOUS | Status: DC | PRN

## 2015-10-02 NOTE — Progress Notes (Signed)
  Echocardiogram 2D Echocardiogram has been performed.  Courtney [name], Courtney [name] M 10/02/2015, 3:27 PM

## 2015-10-02 NOTE — Progress Notes (Signed)
Assessment/Plan: 1 Day Post-Op   POD# 1 S/P Left INTRAMEDULLARY (IM) NAIL INTERTROCHANTRIC by Dr. Jewel Baize. Eulah Pont on 10/01/15  Principal Problem:   Hip fracture (HCC) Active Problems:   Dyslipidemia   Ischemic cardiomyopathy   ESRD on dialysis [facility])   Coronary artery disease involving native coronary artery without angina pectoris   Hypertension   Bradycardia   Hypokalemia   PLAN: Up with therapy Weight Bearing: Weight Bearing as Tolerated (WBAT)  Dressings: Dry dressings prn VTE prophylaxis: SCDs, on Plavix, Patient declines ASA 325 x30 days dt itching allergy Dispo: Per primary.  Subjective: Feeling well.  Patient reports pain as mild, but controlled.  No CP, SOB.  Not oob yet.  Appetite good.  Objective:   VITALS:   Filed Vitals:   10/01/15 1948 10/01/15 1952 10/02/15 0033 10/02/15 0507  BP: 121/47  112/37 128/35  Pulse: 69 57 54 56  Temp:  98 F (36.7 C) 98.1 F (36.7 C) 97.7 F (36.5 C)  TempSrc:  Oral Oral Oral  Resp:  SpO2:  100% 93% 100%    Lab Results  Component Value Date   WBC 7.2 10/02/2015   HGB 9.2* 10/02/2015   HCT 28.5* 10/02/2015   MCV 83.3 10/02/2015   PLT 143* 10/02/2015   BMET    Component Value Date/Time   NA 137 10/02/2015 0342   K 4.3 10/02/2015 0342   CL 99* 10/02/2015 0342   CO2 23 10/02/2015 0342   GLUCOSE 90 10/02/2015 0342   BUN 48* 10/02/2015 0342   CREATININE 8.48* 10/02/2015 0342   CALCIUM 8.5* 10/02/2015 0342   GFRNONAA 4* 10/02/2015 0342   GFRAA 4* 10/02/2015 0342   Imaging: Post op XR Pelvis shows near anatomic alignment of fracture.  Physical Exam General: NAD.  Daughter at bedside.  ABD soft Resp: clear to auscultation bilaterally Cardio: Mild bradycardia Neurovascular intact Sensation intact distally Intact pulses distally Dorsiflexion/Plantar flexion intact Incision: dressing C/D/I and no drainage Extremities: Warm, dry Homans sign is negative, no sign of DVT Dressing: C/D/I    LABS  Results for orders placed or performed during the hospital encounter of 09/30/15 (from the past 24 hour(s))  CBC with Differential/Platelet     Status: Abnormal   Collection Time: 10/02/15  3:42 AM  Result Value Ref Range   WBC 7.2 4.0 - 10.5 K/uL   RBC 3.42 (L) 3.87 - 5.11 MIL/uL   Hemoglobin 9.2 (L) 12.0 - 15.0 g/dL   HCT 30.8 (L) 65.7 - 84.6 %   MCV 83.3 78.0 - 100.0 fL   MCH 26.9 26.0 - 34.0 pg   MCHC 32.3 30.0 - 36.0 g/dL   RDW 96.2 95.2 - 84.1 %   Platelets 143 (L) 150 - 400 K/uL   Neutrophils Relative % 80 %   Neutro Abs 5.7 1.7 - 7.7 K/uL   Lymphocytes Relative 11 %   Lymphs Abs 0.8 0.7 - 4.0 K/uL   Monocytes Relative 8 %   Monocytes Absolute 0.6 0.1 - 1.0 K/uL   Eosinophils Relative 1 %   Eosinophils Absolute 0.1 0.0 - 0.7 K/uL   Basophils Relative 0 %   Basophils Absolute 0.0 0.0 - 0.1 K/uL  Magnesium     Status: None   Collection Time: 10/02/15  3:42 AM  Result Value Ref Range   Magnesium 2.0 1.7 - 2.4 mg/dL  Renal function panel     Status: Abnormal   Collection Time: 10/02/15  3:42 AM  Result  Value Ref Range   Sodium 137 135 - 145 mmol/L   Potassium 4.3 3.5 - 5.1 mmol/L   Chloride 99 (L) 101 - 111 mmol/L   CO2 23 22 - 32 mmol/L   Glucose, Bld 90 65 - 99 mg/dL   BUN 48 (H) 6 - 20 mg/dL   Creatinine, Ser 8.118.48 (H) 0.44 - 1.00 mg/dL   Calcium 8.5 (L) 8.9 - 10.3 mg/dL   Phosphorus 7.6 (H) 2.5 - 4.6 mg/dL   Albumin 2.6 (L) 3.5 - 5.0 g/dL   GFR calc non Af Amer 4 (L) >60 mL/min   GFR calc Af Amer 4 (L) >60 mL/min   Anion gap 15 5 - 15     Henry Calvin Hillside LakeMartensen III 10/02/2015, 7:25 AM

## 2015-10-02 NOTE — Progress Notes (Signed)
Patient ID: Courtney [name], female   DOB: [birth year], 82 y.o.   MRN: [id number]  PROGRESS NOTE    Courtney [name]  WUJ:[id number] DOB: [birth year] DOA: 09/30/2015  PCP: Courtney Carrow, MD   Brief Narrative:  82 year old female with end-stage renal disease on hemodialysis (MWF). Patient presented to Courtney [name] status post mechanical fall. Patient was found to have left hip fracture. She underwent left intramedullary nail by orthopedic surgery 10/01/2015.  Assessment & Plan:  Left hip fracture - Status post left intramedullary nail surgery 10/01/2015. - Appreciate orthopedic surgery following an their recommendation - Continue aspirin for DVT prophylaxis - Per physical therapy, recommendation for skilled nursing facility placement - Appreciate social worker assisting discharge planning  ESRD on hemodialysis Monday Wednesday Friday - Per nephrology  Ischemic cardiomyopathy / History of coronary artery disease / History of peripheral vascular disease - 2 D ECHO in 2009 showed moderate MR with EF 35-45%. - Continue aspirin and Plavix - Continue carvedilol.  Dyslipidemia - Continue atorvastatin  DVT prophylaxis: Aspirin, Plavix Code Status: DNR/DNI Family Communication: Family not at bedside Disposition Plan: Skilled nursing facility likely by 10/03/2015   Consultants:   Orthopedic surgery  Nephrology   Procedures:   Hemodialysis: Monday, Wednesday and Friday  Antimicrobials:   Cefazolin preoperatively    Subjective: Feels okay  Objective: Filed Vitals:   10/01/15 1948 10/01/15 1952 10/02/15 0033 10/02/15 0507  BP: 121/47  112/37 128/35  Pulse: 69 57 54 56  Temp:  98 F (36.7 C) 98.1 F (36.7 C) 97.7 F (36.5 C)  TempSrc:  Oral Oral Oral  Resp:  SpO2:  100% 93% 100%   No intake or output data in the 24 hours ending 10/02/15 1455 There were no vitals filed for this visit.  Examination:  General exam: Appears calm and  comfortable  Respiratory system: Clear to auscultation. Respiratory effort normal. Cardiovascular system: S1 & S2 heard, RRR. No JVD, murmurs, rubs, gallops or clicks. No pedal edema. Gastrointestinal system: Abdomen is nondistended, soft and nontender. No organomegaly or masses felt. Normal bowel sounds heard. Central nervous system: Alert and oriented. No focal neurological deficits. Extremities: Symmetric 5 x 5 power. Has AVF LUE Skin: No rashes, lesions or ulcers Psychiatry: Judgement and insight appear normal. Mood & affect appropriate.   Data Reviewed: I have personally reviewed following labs and imaging studies  CBC:  Recent Labs Lab 09/30/15 0520 10/01/15 0515 10/02/15 0342  WBC 7.5 7.3 7.2  NEUTROABS 6.3  --  5.7  HGB 11.1* 11.5* 9.2*  HCT 34.8* 35.6* 28.5*  MCV 84.7 84.0 83.3  PLT 154 177 143*   Basic Metabolic Panel:  Recent Labs Lab 09/30/15 0520 10/01/15 0515 10/02/15 0342  NA 139 137 137  K 3.2* 4.4 4.3  CL 98* 96* 99*  CO2 GLUCOSE 134* 125* 90  BUN 20 37* 48*  CREATININE 5.05* 7.06* 8.48*  CALCIUM 9.1 9.3 8.5*  MG  --   --  2.0  PHOS  --   --  7.6*   GFR: Estimated Creatinine Clearance: 3 mL/min (by C-G formula based on Cr of 8.48). Liver Function Tests:  Recent Labs Lab 10/02/15 0342  ALBUMIN 2.6*   No results for input(s): LIPASE, AMYLASE in the last 168 hours. No results for input(s): AMMONIA in the last 168 hours. Coagulation Profile: No results for input(s): INR, PROTIME in the last 168 hours. Cardiac Enzymes: No results for input(s): CKTOTAL, CKMB, CKMBINDEX, TROPONINI  in the last 168 hours. BNP (last 3 results) No results for input(s): PROBNP in the last 8760 hours. HbA1C: No results for input(s): HGBA1C in the last 72 hours. CBG: No results for input(s): GLUCAP in the last 168 hours. Lipid Profile: No results for input(s): CHOL, HDL, LDLCALC, TRIG, CHOLHDL, LDLDIRECT in the last 72 hours. Thyroid Function Tests: No  results for input(s): TSH, T4TOTAL, FREET4, T3FREE, THYROIDAB in the last 72 hours. Anemia Panel: No results for input(s): VITAMINB12, FOLATE, FERRITIN, TIBC, IRON, RETICCTPCT in the last 72 hours. Urine analysis: No results found for: COLORURINE, APPEARANCEUR, LABSPEC, PHURINE, GLUCOSEU, HGBUR, BILIRUBINUR, KETONESUR, PROTEINUR, UROBILINOGEN, NITRITE, LEUKOCYTESUR Sepsis Labs: @LABRCNTIP (procalcitonin:4,lacticidven:4)   Recent Results (from the past 240 hour(s))  Surgical PCR screen     Status: None   Collection Time: 09/30/15  2:08 PM  Result Value Ref Range Status   MRSA, PCR NEGATIVE NEGATIVE Final   Staphylococcus aureus NEGATIVE NEGATIVE Final    Comment:        The Xpert SA Assay (FDA approved for NASAL specimens in patients over 80 years of age), is one component of a comprehensive surveillance program.  Test performance has been validated by [facility] for patients greater than or equal to 80 year old. It is not intended to diagnose infection nor to guide or monitor treatment.       Radiology Studies: Dg Chest 1 View 09/30/2015 No evidence of acute cardiopulmonary disease. Electronically Signed   By: Charline BillsSriyesh  Krishnan M.D.   On: 09/30/2015 07:14   Ct Head Wo Contrast 09/30/2015   HEAD CT:  No acute intracranial abnormalities.  No skull fracture. CERVICAL CT:  No fracture or acute finding. Electronically Signed   By: Amie Portlandavid  Ormond M.D.   On: 09/30/2015 07:11   Ct Cervical Spine Wo Contrast 09/30/2015 HEAD CT:  No acute intracranial abnormalities.  No skull fracture. CERVICAL CT:  No fracture or acute finding. Electronically Signed   By: Amie Portlandavid  Ormond M.D.   On: 09/30/2015 07:11   Pelvis Portable 10/01/2015  Near anatomic alignment post ORIF of the intertrochanteric left femoral neck fracture. No acute complicating features. Electronically Signed   By: Hulan Saashomas  Lawrence M.D.   On: 10/01/2015 11:58   Dg Hip Operative Unilat W Or W/o Pelvis Left 10/01/2015 Appropriate  placement of hardware across the left hip fracture. Electronically Signed   By: Gerome Samavid  Verdun III M.D   On: 10/01/2015 11:12   Dg Hip Unilat W Or W/o Pelvis 2-3 Views Left 09/30/2015   Intertrochanteric left hip fracture, as above. Electronically Signed   By: Charline BillsSriyesh  Krishnan M.D.   On: 09/30/2015 07:16        Scheduled Meds: . aspirin EC  325 mg Oral Daily  . atorvastatin  80 mg Oral Daily  . calcium acetate  1,334 mg Oral TID WC  . carvedilol  12.5 mg Oral BID WC  . clopidogrel  75 mg Oral Daily  . darbepoetin (ARANESP) injection - DIALYSIS  60 mcg Intravenous Q Mon-HD  . feeding supplement (NEPRO CARB STEADY)  237 mL Oral BID BM  . latanoprost  1 drop Both Eyes QHS  . potassium chloride  30 mEq Oral Once   Continuous Infusions:    LOS: 2 days    Time spent: 25 minutes  Greater than 50% of the time spent on counseling and coordinating the care.   Manson PasseyEVINE, Minola Guin, MD [facility] Pager [phone]  If 7PM-7AM, please contact night-coverage [email or web address] Password [email or web address] 10/02/2015, 2:55 PM

## 2015-10-02 NOTE — Evaluation (Signed)
Physical Therapy Evaluation Patient Details Name: Courtney [name] MRN: [id number] DOB: 02-Feb-1931 Today's Date: 10/02/2015   History of Present Illness  Pt is an 82 y/o female with a PMH significant for Alzheimer's dementia who presents s/p fall and L intertrochanteric hip fracture. Pt is now s/p L IM nailing and is WBAT.   Clinical Impression  Pt admitted with above diagnosis. Pt currently with functional limitations due to the deficits listed below (see PT Problem List). At the time of PT eval pt was able to perform transfers with gross moderate assistance however feel +2 would be helpful for future attempts at gait training. Pt unable to provide reliable home information, and unsure how often daughter is available for assistance. Recommending SNF follow-up at this time. Pt will benefit from skilled PT to increase their independence and safety with mobility to allow discharge to the venue listed below.       Follow Up Recommendations SNF;Supervision/Assistance - 24 hour    Equipment Recommendations  Rolling walker with 5" wheels;3in1 (PT)    Recommendations for Other Services       Precautions / Restrictions Precautions Precautions: Fall Restrictions Weight Bearing Restrictions: Yes LLE Weight Bearing: Weight bearing as tolerated      Mobility  Bed Mobility Overal bed mobility: Needs Assistance Bed Mobility: Supine to Sit     Supine to sit: Mod assist     General bed mobility comments: +2 utilized due to pt initially resisting due to pain, however feel the patient could have transitioned to EOB with +1 assist. Bed pad used to complete scooting so feet were planted on the floor.   Transfers Overall transfer level: Needs assistance Equipment used: 2 person hand held assist Transfers: Sit to/from [employer] Sit to Stand: Mod assist Stand pivot transfers: Mod assist;+2 safety/equipment       General transfer comment: +2 utilized for safety however feel  pt could have completed SPT with +1 assist. Pt was able to power-up to full standing position and take a few pivotal steps around to the recliner with therapist support and increased time.   Ambulation/Gait             General Gait Details: Unable to attempt at this time, however would recommend +2 assist for future gait training.   Stairs            Wheelchair Mobility    Modified Rankin (Stroke Patients Only)       Balance Overall balance assessment: Needs assistance Sitting-balance support: Feet supported;No upper extremity supported Sitting balance-Leahy Scale: Fair     Standing balance support: Bilateral upper extremity supported;During functional activity Standing balance-Leahy Scale: Poor Standing balance comment: Requires UE support/assist at this time to maintain standing balance.                              Pertinent Vitals/Pain Pain Assessment: Faces Faces Pain Scale: Hurts little more Pain Location: L hip. Pt required reminders as to why her hip was hurting as she does not recall having surgery.  Pain Descriptors / Indicators: Operative site guarding;Sore Pain Intervention(s): Limited activity within patient's tolerance;Monitored during session;Repositioned    Home Living Family/patient expects to be discharged to:: Skilled nursing facility                 Additional Comments: Pt is a poor historian. Per RN, pt lives with daughter.     Prior Function  Comments: Unsure of PLOF. Pt reports she was not using an AD and could "get up and go" when she wanted to.      Hand Dominance        Extremity/Trunk Assessment   Upper Extremity Assessment: Defer to OT evaluation           Lower Extremity Assessment: LLE deficits/detail   LLE Deficits / Details: Decreased strength and AROM consistent with above mentioned procedure. Noted decreased DF actively as well however was able to curl toes around therapist's finger.    Cervical / Trunk Assessment: Kyphotic  Communication   Communication: No difficulties  Cognition Arousal/Alertness: Awake/alert Behavior During Therapy: WFL for tasks assessed/performed (Pleasant) Overall Cognitive Status: History of cognitive impairments - at baseline       Memory: Decreased short-term memory              General Comments      Exercises General Exercises - Lower Extremity Ankle Circles/Pumps: 10 reps      Assessment/Plan    PT Assessment Patient needs continued PT services  PT Diagnosis Difficulty walking;Acute pain   PT Problem List Decreased strength;Decreased range of motion;Decreased activity tolerance;Decreased balance;Decreased mobility;Decreased knowledge of use of DME;Decreased safety awareness;Decreased knowledge of precautions;Pain;Decreased cognition  PT Treatment Interventions     PT Goals (Current goals can be found in the Care Plan section) Acute Rehab PT Goals Patient Stated Goal: Pt did not state goals at this time.  PT Goal Formulation: Patient unable to participate in goal setting Time For Goal Achievement: 10/16/15 Potential to Achieve Goals: Good    Frequency Min 3X/week   Barriers to discharge        Co-evaluation               End of Session Equipment Utilized During Treatment: Gait belt Activity Tolerance: Patient tolerated treatment well Patient left: in chair;with call bell/phone within reach;with chair alarm set Nurse Communication: Mobility status         Time: [id number] PT Time Calculation (min) (ACUTE ONLY): 20 min   Charges:   PT Evaluation $PT Eval Moderate Complexity: 1 Procedure     PT G Codes:        Conni SlipperKirkman, Elmond Poehlman 10/02/2015, 8:40 AM   Conni SlipperLaura Adiyah Lame, PT, DPT Acute Rehabilitation Services Pager: [phone]

## 2015-10-02 NOTE — Evaluation (Signed)
Occupational Therapy Evaluation Patient Details Name: Courtney [name] MRN: [id number] DOB: Nov 14, 1930 Today's Date: 10/02/2015    History of Present Illness Pt is an 82 y/o female with a PMH significant for Alzheimer's dementia who presents s/p fall and L intertrochanteric hip fracture. Pt is now s/p L IM nailing and is WBAT.    Clinical Impression   This 82 yo female admitted and underwent above presents to acute OT with deficits below (see OT problem list) thus affecting her ability to care for herself as she did pta and increasing burden of care on family. She will benefit from acute OT with primary recommendation follow up therapy at SNF to get her more ambulatory and independent with basic ADLs.    Follow Up Recommendations  SNF;Other (comment);Supervision/Assistance - 24 hour (if family can provide 24 hour A/S then could go home with HHOT)            Precautions / Restrictions Precautions Precautions: Fall Restrictions Weight Bearing Restrictions: Yes LLE Weight Bearing: Weight bearing as tolerated      Mobility Bed Mobility Overal bed mobility: Needs Assistance Bed Mobility: Supine to Sit     Supine to sit: Mod assist     General bed mobility comments: +2 utilized due to pt initially resisting due to pain, however feel the patient could have transitioned to EOB with +1 assist. Bed pad used to complete scooting so feet were planted on the floor.   Transfers Overall transfer level: Needs assistance Equipment used: 2 person hand held assist Transfers: Sit to/from [employer] Sit to Stand: Mod assist Stand pivot transfers: Mod assist;+2 safety/equipment       General transfer comment: +2 utilized for safety however feel pt could have completed SPT with +1 assist. Pt was able to power-up to full standing position and take a few pivotal steps around to the recliner with therapist support and increased time.     Balance Overall balance assessment:  Needs assistance Sitting-balance support: Feet supported;No upper extremity supported Sitting balance-Leahy Scale: Fair     Standing balance support: Bilateral upper extremity supported;During functional activity Standing balance-Leahy Scale: Poor Standing balance comment: Requires UE support/A                             ADL Overall ADL's : Needs assistance/impaired Eating/Feeding: Set up;Supervision/ safety;Sitting   Grooming: Supervision/safety;Set up;Sitting   Upper Body Bathing: Supervision/ safety;Set up;Sitting   Lower Body Bathing: Maximal assistance (Mod A sit<>stand)   Upper Body Dressing : Moderate assistance;Sitting   Lower Body Dressing: Total assistance (Mod A sit<>stand)   Toilet Transfer: Stand-pivot;Moderate assistance;+2 for safety/equipment Toilet Transfer Details (indicate cue type and reason): Bed>recliner on her right Toileting- Clothing Manipulation and Hygiene: Total assistance (Mod A for standing)                         Pertinent Vitals/Pain Pain Assessment: Faces Faces Pain Scale: Hurts little more Pain Location: Left hip; required reminders as to why her hip was hurting  Pain Descriptors / Indicators: Grimacing;Operative site guarding;Guarding;Sore Pain Intervention(s): Monitored during session;Limited activity within patient's tolerance;Repositioned     Hand Dominance Right   Extremity/Trunk Assessment Upper Extremity Assessment Upper Extremity Assessment: Generalized weakness (decreased A/PROM at both shoulders)   Lower Extremity Assessment Lower Extremity Assessment: LLE deficits/detail LLE Deficits / Details: Decreased strength and AROM consistent with above mentioned procedure. Noted decreased DF actively as  well however was able to curl toes around therapist's finger.    Cervical / Trunk Assessment Cervical / Trunk Assessment: Kyphotic   Communication Communication Communication: No difficulties   Cognition  Arousal/Alertness: Awake/alert Behavior During Therapy: WFL for tasks assessed/performed Overall Cognitive Status: History of cognitive impairments - at baseline       Memory: Decreased short-term memory                        Home Living Family/patient expects to be discharged to:: Skilled nursing facility                                 Additional Comments: Pt is a poor historian. Per RN, pt lives with daughter.       Prior Functioning/Environment          Comments: Unsure of PLOF. Pt reports she was not using an AD and could "get up and go" when she wanted to.     OT Diagnosis: Generalized weakness;Acute pain   OT Problem List: Decreased strength;Decreased range of motion;Impaired balance (sitting and/or standing);Decreased cognition;Decreased knowledge of use of DME or AE;Pain   OT Treatment/Interventions: Self-care/ADL training;Patient/family education;Balance training;Therapeutic activities;DME and/or AE instruction    OT Goals(Current goals can be found in the care plan section) Acute Rehab OT Goals OT Goal Formulation: With patient Time For Goal Achievement: 10/09/15 Potential to Achieve Goals: Fair  OT Frequency: Min 2X/week           Co-evaluation PT/OT/SLP Co-Evaluation/Treatment: Yes Reason for Co-Treatment: For patient/therapist safety   OT goals addressed during session: ADL's and self-care;Strengthening/ROM      End of Session Equipment Utilized During Treatment: Gait belt Nurse Communication: Mobility status  Activity Tolerance: Patient tolerated treatment well Patient left: in chair;with call bell/phone within reach;with chair alarm set   Time: [phone] OT Time Calculation (min): 20 min Charges:  OT General Charges $OT Visit: 1 Procedure OT Evaluation $OT Eval Moderate Complexity: 1 Procedure  Evette GeorgesLeonard, Marena Witts Eva [phone] 10/02/2015, 9:34 AM

## 2015-10-02 NOTE — Clinical Documentation Improvement (Signed)
Internal Medicine  Please clarify nutritional status in progress notes and discharge summary   Severe Malnutrition  Other condition  Unable to clinically determine  Document any associated diagnoses/conditions   Supporting Information: :    82 year old admitted with hip fracture  RD Initial Nutrition Assessment 5/20 DOCUMENTATION CODES:  Severe malnutrition in context of chronic illness, Underweight NUTRITION DIAGNOSIS: Malnutrition related to chronic illness (ESRD) as evidenced by severe depletion of body fat, severe depletion of muscle mass. ASSESSMENT: Spoke with pt and family. Pt does not eat much. She has nepro shakes at home but does not consume them often. Pt feels that she has had weight loss recently but is unsure of how much.  Lunch at bedside untouched. Pt does not want to eat right now. Pt seemed to forget that she had told me things and would repeat them.  Diet Order: Diet renal with fluid restriction Fluid restriction:: 1200 mL Fluid; Room service appropriate?: Yes; Fluid consistency:: Thin  Height:  5'2" Weight:  83 lbs BMI:  15.2  MONITOR: PO intake, Supplement acceptance, I & O's, Weight trends INTERVENTION: Nepro Shake po BID, each supplement provides 425 kcal and 19 grams protein     Please exercise your independent, professional judgment when responding. A specific answer is not anticipated or expected.   Thank You, Harless Littenebora T Niazi [employer] Management [place] [phone]

## 2015-10-02 NOTE — Progress Notes (Signed)
Subjective:  Seems comfortable but confused regarding events- "I need to get out of here"  Objective Vital signs in last 24 hours: Filed Vitals:   10/01/15 1948 10/01/15 1952 10/02/15 0033 10/02/15 0507  BP: 121/47  112/37 128/35  Pulse: 69 57 54 56  Temp:  98 F (36.7 C) 98.1 F (36.7 C) 97.7 F (36.5 C)  TempSrc:  Oral Oral Oral  Resp:  SpO2:  100% 93% 100%   Weight change:  No intake or output data in the 24 hours ending 10/02/15 1022  Dialysis: [facility] 3.5h 37kg 2/2.25 bath P4 Hep 1400 LUA AVF  Assessment/ Plan: Pt is a 82 y.o. yo female with ESRD who was admitted on 09/30/2015 with s/p fall, hip fracture/ORIF   Assessment/Plan: 1. S/p hip fx- ORIF on 5/22 2. ESRD - normally MWF at [facility] via AVF- HD later today- no heparin   3. Anemia- pre op over 11, now in the 9's- add ESA 4. Secondary hyperparathyroidism- cont phoslo - will increase dose some given phos of 7.6 5. HTN/volume- BP good- coreg only   Kalandra Masters A    Labs: Basic Metabolic Panel:  Recent Labs Lab 09/30/15 0520 10/01/15 0515 10/02/15 0342  NA 139 137 137  K 3.2* 4.4 4.3  CL 98* 96* 99*  CO2 GLUCOSE 134* 125* 90  BUN 20 37* 48*  CREATININE 5.05* 7.06* 8.48*  CALCIUM 9.1 9.3 8.5*  PHOS  --   --  7.6*   Liver Function Tests:  Recent Labs Lab 10/02/15 0342  ALBUMIN 2.6*   No results for input(s): LIPASE, AMYLASE in the last 168 hours. No results for input(s): AMMONIA in the last 168 hours. CBC:  Recent Labs Lab 09/30/15 0520 10/01/15 0515 10/02/15 0342  WBC 7.5 7.3 7.2  NEUTROABS 6.3  --  5.7  HGB 11.1* 11.5* 9.2*  HCT 34.8* 35.6* 28.5*  MCV 84.7 84.0 83.3  PLT 154 177 143*   Cardiac Enzymes: No results for input(s): CKTOTAL, CKMB, CKMBINDEX, TROPONINI in the last 168 hours. CBG: No results for input(s): GLUCAP in the last 168 hours.  Iron Studies: No results for input(s): IRON, TIBC, TRANSFERRIN, FERRITIN in the last 72  hours. Studies/Results: Pelvis Portable  10/01/2015  CLINICAL DATA:  Postop day 0 ORIF intertrochanteric left femoral neck fracture. EXAM: PORTABLE PELVIS 1-2 VIEWS COMPARISON:  Intraoperative left hip x-rays earlier today 8:49 a.m. and left hip x-rays yesterday. FINDINGS: Portable AP views of the pelvis and left femur were obtained. ORIF of the intertrochanteric left femoral neck fracture with intramedullary nail and compression screw. Near anatomic alignment. No acute complicating features. IMPRESSION: Near anatomic alignment post ORIF of the intertrochanteric left femoral neck fracture. No acute complicating features. Electronically Signed   By: Hulan Saas M.D.   On: 10/01/2015 11:58   Dg Hip Operative Unilat W Or W/o Pelvis Left  10/01/2015  CLINICAL DATA:  Status post hip repair. FLUOROSCOPY TIME:  45 seconds. Images: 3. EXAM: OPERATIVE left HIP (WITH PELVIS IF PERFORMED) 3 VIEWS TECHNIQUE: Fluoroscopic spot image(s) were submitted for interpretation post-operatively. COMPARISON:  Sep 30, 2015 FINDINGS: A gamma nail and rod have been placed across the left hip fracture. Hardware is in good position. IMPRESSION: Appropriate placement of hardware across the left hip fracture. Electronically Signed   By: Gerome Sam III M.D   On: 10/01/2015 11:12   Medications: Infusions:    Scheduled Medications: . aspirin EC  325 mg Oral Daily  .  atorvastatin  80 mg Oral Daily  . calcium acetate  667 mg Oral TID WC  . carvedilol  12.5 mg Oral BID WC  . clopidogrel  75 mg Oral Daily  . feeding supplement (NEPRO CARB STEADY)  237 mL Oral BID BM  . latanoprost  1 drop Both Eyes QHS  . potassium chloride  30 mEq Oral Once    have reviewed scheduled and prn medications.  Physical Exam: General: thin, NAD- wondering where her belongings are  Heart: RRR Lungs: mostly clear  Abdomen: soft, non tender Extremities: no edema Dialysis Access: left arm AVF with good thrill and bruit      10/02/2015,10:22 AM  LOS: 2 days

## 2015-10-03 ENCOUNTER — Encounter: Payer: Medicare Other | Admitting: Vascular Surgery

## 2015-10-03 ENCOUNTER — Encounter (HOSPITAL_COMMUNITY): Payer: Self-pay | Admitting: Orthopedic Surgery

## 2015-10-03 DIAGNOSIS — S72001S Fracture of unspecified part of neck of right femur, sequela: Secondary | ICD-10-CM

## 2015-10-03 LAB — CBC WITH DIFFERENTIAL/PLATELET
BASOS ABS: 0 10*3/uL (ref 0.0–0.1)
Basophils Relative: 0 %
EOS PCT: 2 %
Eosinophils Absolute: 0.1 10*3/uL (ref 0.0–0.7)
HCT: 26.1 % — ABNORMAL LOW (ref 36.0–46.0)
Hemoglobin: 8.7 g/dL — ABNORMAL LOW (ref 12.0–15.0)
LYMPHS PCT: 10 %
Lymphs Abs: 0.6 10*3/uL — ABNORMAL LOW (ref 0.7–4.0)
MCH: 27.3 pg (ref 26.0–34.0)
MCHC: 33.3 g/dL (ref 30.0–36.0)
MCV: 81.8 fL (ref 78.0–100.0)
MONO ABS: 0.7 10*3/uL (ref 0.1–1.0)
Monocytes Relative: 11 %
NEUTROS ABS: 4.7 10*3/uL (ref 1.7–7.7)
NEUTROS PCT: 77 %
PLATELETS: 136 10*3/uL — AB (ref 150–400)
RBC: 3.19 MIL/uL — ABNORMAL LOW (ref 3.87–5.11)
RDW: 15 % (ref 11.5–15.5)
WBC: 6.2 10*3/uL (ref 4.0–10.5)

## 2015-10-03 LAB — CBC AND DIFFERENTIAL: WBC: 6.2 10*3/mL

## 2015-10-03 MED ORDER — CARVEDILOL 6.25 MG PO TABS: 6.2500 mg | ORAL_TABLET | Freq: Two times a day (BID) | ORAL | Status: DC

## 2015-10-03 MED ORDER — NEPRO/CARBSTEADY PO LIQD: 237.0000 mL | Freq: Two times a day (BID) | ORAL | Status: AC

## 2015-10-03 MED ORDER — ASPIRIN 325 MG PO TBEC: 325.0000 mg | DELAYED_RELEASE_TABLET | Freq: Every day | ORAL | Status: DC

## 2015-10-03 MED ORDER — SENNOSIDES-DOCUSATE SODIUM 8.6-50 MG PO TABS: 1.0000 | ORAL_TABLET | Freq: Every evening | ORAL | Status: DC | PRN

## 2015-10-03 MED ORDER — ACETAMINOPHEN 325 MG PO TABS: 650.0000 mg | ORAL_TABLET | Freq: Four times a day (QID) | ORAL | Status: DC | PRN

## 2015-10-03 NOTE — Discharge Summary (Addendum)
Physician Discharge Summary  Courtney [name] ZOX:[id number] DOB: 1930-09-28 DOA: 09/30/2015  PCP: [name], MD  Admit date: 09/30/2015 Discharge date: 10/03/2015  Recommendations for Outpatient Follow-up:  Please note patient declined aspirin in hospital because of side effect of itching. She is on Plavix and she will continue preferably aspirin and Plavix but per ortho continue plavix since she has declined aspirin. As mentioned, ideally she should be on aspirin 325 mg daily for 30 days in addition to Plavix.  Discharge Diagnoses:  Principal Problem:   Hip fracture (HCC) Active Problems:   Dyslipidemia   Ischemic cardiomyopathy   ESRD on dialysis [facility])   Coronary artery disease involving native coronary artery without angina pectoris   Hypertension   Bradycardia   Hypokalemia    Discharge Condition: stable   Diet recommendation: as tolerated   History of present illness:  82 year old female with end-stage renal disease on hemodialysis (MWF). Patient presented to [facility] status post mechanical fall. Patient was found to have left hip fracture. She underwent left intramedullary nail by orthopedic surgery 10/01/2015.   Hospital Course:   Assessment & Plan:  Left hip fracture - Status post left intramedullary nail surgery 10/01/2015. - Appreciate orthopedic surgery recommendation - Continue aspirin and plavix for DVT prophylaxis but please note pt declined aspirin during this hospital stay. Ideally she should continue for 1 month on discharge. - Per physical therapy, recommendation for skilled nursing facility placement - Appreciate social worker assisting discharge planning  ESRD on hemodialysis Monday Wednesday Friday - Per nephrology schedule - Continue hemodialysis Monday Wednesday and Friday  Ischemic cardiomyopathy / History of coronary artery disease / History of peripheral vascular disease - 2 D ECHO in 2009 showed moderate MR with EF  35-45%. - Continue aspirin and Plavix - Continue carvedilol.  Dyslipidemia - Continue atorvastatin  DVT prophylaxis: Aspirin, Plavix Code Status: DNR/DNI Family Communication: Family not at bedside    Consultants:   Orthopedic surgery  Nephrology  Procedures:   Hemodialysis: Monday, Wednesday and Friday  Antimicrobials:   Cefazolin preoperatively   Signed:  Manson Passey, MD  [facility] 10/03/2015, 12:30 PM  Pager #: [phone]  Time spent in minutes: more than 30 minutes   Discharge Exam: Filed Vitals:   10/02/15 2101 10/03/15 0617  BP: 147/27 88/60  Pulse: 74 66  Temp: 98.2 F (36.8 C) 98.1 F (36.7 C)  Resp:  20   Filed Vitals:   10/02/15 1930 10/02/15 1950 10/02/15 2101 10/03/15 0617  BP: 84/55 106/64 147/27 88/60  Pulse: 72 77 74 66  Temp:  98 F (36.7 C) 98.2 F (36.8 C) 98.1 F (36.7 C)  TempSrc:   Oral Oral  Resp: 20 20  20   Weight:      SpO2:   94% 90%    General: Pt is alert, follows commands appropriately, not in acute distress Cardiovascular: Regular rate and rhythm, S1/S2 + Respiratory: Clear to auscultation bilaterally, no wheezing, no crackles, no rhonchi Abdominal: Soft, non tender, non distended, bowel sounds +, no guarding Extremities: no edema, no cyanosis, pulses palpable bilaterally DP and PT Neuro: Grossly nonfocal  Discharge Instructions  Discharge Instructions    Call MD for:  difficulty breathing, headache or visual disturbances    Complete by:  As directed      Call MD for:  persistant dizziness or light-headedness    Complete by:  As directed      Call MD for:  persistant nausea and vomiting    Complete  by:  As directed      Call MD for:  severe uncontrolled pain    Complete by:  As directed      Diet - low sodium heart healthy    Complete by:  As directed      Discharge instructions    Complete by:  As directed   Please note patient declined aspirin in hospital because of side effect of itching.  She is on Plavix and she will continue preferably aspirin and Plavix but per ortho continue plavix since she has declined aspirin     Increase activity slowly    Complete by:  As directed             Medication List    STOP taking these medications        oxyCODONE-acetaminophen 5-325 MG tablet  Commonly known as:  PERCOCET/ROXICET      TAKE these medications        acetaminophen 325 MG tablet  Commonly known as:  TYLENOL  Take 2 tablets (650 mg total) by mouth every 6 (six) hours as needed for mild pain (or Fever >/= 101).     aspirin 325 MG EC tablet  Take 1 tablet (325 mg total) by mouth daily.     atorvastatin 80 MG tablet  Commonly known as:  LIPITOR  Take 1 tablet (80 mg total) by mouth daily.     calcium acetate 667 MG capsule  Commonly known as:  PHOSLO  Take 667 mg by mouth 3 (three) times daily with meals.     carvedilol 12.5 MG tablet  Commonly known as:  COREG  Take 1 tablet (12.5 mg total) by mouth 2 (two) times daily with a meal.     clopidogrel 75 MG tablet  Commonly known as:  PLAVIX  TAKE ONE TABLET BY MOUTH ONCE DAILY     DIALYVITE 800 PO  Take 1 capsule by mouth daily.     DRY EYES OP  Place 1 drop into both eyes daily as needed (for dry eyes).     feeding supplement (NEPRO CARB STEADY) Liqd  Take 237 mLs by mouth 2 (two) times daily between meals.     latanoprost 0.005 % ophthalmic solution  Commonly known as:  XALATAN  Place 1 drop into both eyes at bedtime.     senna-docusate 8.6-50 MG tablet  Commonly known as:  Senokot-S  Take 1 tablet by mouth at bedtime as needed for mild constipation.     Travoprost (BAK Free) 0.004 % Soln ophthalmic solution  Commonly known as:  TRAVATAN  Place 1 drop into both eyes at bedtime.           Follow-up Information    Follow up with MURPHY, TIMOTHY D, MD In 2 weeks.   Specialty:  Orthopedic Surgery   Contact information:   [address] [place] [address] [phone]        Follow up with Vernie Ammons B, MD. Schedule an appointment as soon as possible for a visit in 1 week.   Specialty:  Anesthesiology   Why:  Follow up appt after recent hospitalization       The results of significant diagnostics from this hospitalization (including imaging, microbiology, ancillary and laboratory) are listed below for reference.    Significant Diagnostic Studies: Dg Chest 1 View  09/30/2015  CLINICAL DATA:  Fall, left hip deformity EXAM: CHEST 1 VIEW COMPARISON:  04/18/2011 FINDINGS: Chronic interstitial markings/emphysematous changes with scarring in  the right upper lobe. No focal consolidation. No pleural effusion or pneumothorax. The heart is normal in size. Postsurgical changes related to prior CABG. Median sternotomy. IMPRESSION: No evidence of acute cardiopulmonary disease. Electronically Signed   By: Charline BillsSriyesh  Krishnan M.D.   On: 09/30/2015 07:14   Ct Head Wo Contrast  09/30/2015  CLINICAL DATA:  Fall this morning. EXAM: CT HEAD WITHOUT CONTRAST CT CERVICAL SPINE WITHOUT CONTRAST TECHNIQUE: Multidetector CT imaging of the head and cervical spine was performed following the standard protocol without intravenous contrast. Multiplanar CT image reconstructions of the cervical spine were also generated. COMPARISON:  None. FINDINGS: CT HEAD FINDINGS The ventricles are normal in configuration. There is ventricular and sulcal enlargement reflecting mild, diffuse atrophy. There are no parenchymal masses or mass effect. Patchy white matter hypoattenuation is noted consistent with moderate chronic microvascular ischemic change. There is no evidence of a recent cortical infarct. There are no extra-axial masses or abnormal fluid collections. There is no intracranial hemorrhage. The visualized sinuses and mastoid air cells are clear. No skull fracture. CT CERVICAL SPINE FINDINGS No fracture. Slight, grade 1, anterolisthesis of C4 on C5. There is straightening of the normal cervical  lordosis. Mild loss of disc height at C3-C4. Moderate loss disc height at C4-C5, C5-C6 and C6-C7. There is facet degenerative change most evident on the right at C3-C4 and C4-C5. The bones are diffusely demineralized. Soft tissues show vascular calcifications but are otherwise unremarkable. Lung apices demonstrate emphysema and scarring. IMPRESSION: HEAD CT:  No acute intracranial abnormalities.  No skull fracture. CERVICAL CT:  No fracture or acute finding. Electronically Signed   By: Amie Portlandavid  Ormond M.D.   On: 09/30/2015 07:11   Ct Cervical Spine Wo Contrast  09/30/2015  CLINICAL DATA:  Fall this morning. EXAM: CT HEAD WITHOUT CONTRAST CT CERVICAL SPINE WITHOUT CONTRAST TECHNIQUE: Multidetector CT imaging of the head and cervical spine was performed following the standard protocol without intravenous contrast. Multiplanar CT image reconstructions of the cervical spine were also generated. COMPARISON:  None. FINDINGS: CT HEAD FINDINGS The ventricles are normal in configuration. There is ventricular and sulcal enlargement reflecting mild, diffuse atrophy. There are no parenchymal masses or mass effect. Patchy white matter hypoattenuation is noted consistent with moderate chronic microvascular ischemic change. There is no evidence of a recent cortical infarct. There are no extra-axial masses or abnormal fluid collections. There is no intracranial hemorrhage. The visualized sinuses and mastoid air cells are clear. No skull fracture. CT CERVICAL SPINE FINDINGS No fracture. Slight, grade 1, anterolisthesis of C4 on C5. There is straightening of the normal cervical lordosis. Mild loss of disc height at C3-C4. Moderate loss disc height at C4-C5, C5-C6 and C6-C7. There is facet degenerative change most evident on the right at C3-C4 and C4-C5. The bones are diffusely demineralized. Soft tissues show vascular calcifications but are otherwise unremarkable. Lung apices demonstrate emphysema and scarring. IMPRESSION: HEAD CT:   No acute intracranial abnormalities.  No skull fracture. CERVICAL CT:  No fracture or acute finding. Electronically Signed   By: Amie Portlandavid  Ormond M.D.   On: 09/30/2015 07:11   Pelvis Portable  10/01/2015  CLINICAL DATA:  Postop day 0 ORIF intertrochanteric left femoral neck fracture. EXAM: PORTABLE PELVIS 1-2 VIEWS COMPARISON:  Intraoperative left hip x-rays earlier today 8:49 a.m. and left hip x-rays yesterday. FINDINGS: Portable AP views of the pelvis and left femur were obtained. ORIF of the intertrochanteric left femoral neck fracture with intramedullary nail and compression screw. Near anatomic alignment. No acute  complicating features. IMPRESSION: Near anatomic alignment post ORIF of the intertrochanteric left femoral neck fracture. No acute complicating features. Electronically Signed   By: Hulan Saas M.D.   On: 10/01/2015 11:58   Dg Hip Operative Unilat W Or W/o Pelvis Left  10/01/2015  CLINICAL DATA:  Status post hip repair. FLUOROSCOPY TIME:  45 seconds. Images: 3. EXAM: OPERATIVE left HIP (WITH PELVIS IF PERFORMED) 3 VIEWS TECHNIQUE: Fluoroscopic spot image(s) were submitted for interpretation post-operatively. COMPARISON:  Sep 30, 2015 FINDINGS: A gamma nail and rod have been placed across the left hip fracture. Hardware is in good position. IMPRESSION: Appropriate placement of hardware across the left hip fracture. Electronically Signed   By: Gerome Sam III M.D   On: 10/01/2015 11:12   Dg Hip Unilat W Or W/o Pelvis 2-3 Views Left  09/30/2015  CLINICAL DATA:  Fall, left hip deformity EXAM: DG HIP (WITH OR WITHOUT PELVIS) 2-3V LEFT COMPARISON:  None. FINDINGS: Intertrochanteric left hip fracture. Varus angulation with foreshortening. Bilateral hip joint spaces are preserved. Bony pelvis appears intact. Vascular calcifications. Left iliac stent. Additional vascular stent in the right medial thigh. IMPRESSION: Intertrochanteric left hip fracture, as above. Electronically Signed   By:  Charline Bills M.D.   On: 09/30/2015 07:16    Microbiology: Recent Results (from the past 240 hour(s))  Surgical PCR screen     Status: None   Collection Time: 09/30/15  2:08 PM  Result Value Ref Range Status   MRSA, PCR NEGATIVE NEGATIVE Final   Staphylococcus aureus NEGATIVE NEGATIVE Final    Comment:        The Xpert SA Assay (FDA approved for NASAL specimens in patients over 23 years of age), is one component of a comprehensive surveillance program.  Test performance has been validated by [facility] for patients greater than or equal to 47 year old. It is not intended to diagnose infection nor to guide or monitor treatment.      Labs: Basic Metabolic Panel:  Recent Labs Lab 09/30/15 0520 10/01/15 0515 10/02/15 0342  NA 139 137 137  K 3.2* 4.4 4.3  CL 98* 96* 99*  CO2 GLUCOSE 134* 125* 90  BUN 20 37* 48*  CREATININE 5.05* 7.06* 8.48*  CALCIUM 9.1 9.3 8.5*  MG  --   --  2.0  PHOS  --   --  7.6*   Liver Function Tests:  Recent Labs Lab 10/02/15 0342  ALBUMIN 2.6*   No results for input(s): LIPASE, AMYLASE in the last 168 hours. No results for input(s): AMMONIA in the last 168 hours. CBC:  Recent Labs Lab 09/30/15 0520 10/01/15 0515 10/02/15 0342 10/03/15 0704  WBC 7.5 7.3 7.2 6.2  NEUTROABS 6.3  --  5.7 4.7  HGB 11.1* 11.5* 9.2* 8.7*  HCT 34.8* 35.6* 28.5* 26.1*  MCV 84.7 84.0 83.3 81.8  PLT 154 177 143* 136*   Cardiac Enzymes: No results for input(s): CKTOTAL, CKMB, CKMBINDEX, TROPONINI in the last 168 hours. BNP: BNP (last 3 results) No results for input(s): BNP in the last 8760 hours.  ProBNP (last 3 results) No results for input(s): PROBNP in the last 8760 hours.  CBG: No results for input(s): GLUCAP in the last 168 hours.

## 2015-10-03 NOTE — Clinical Social Work Note (Signed)
Clinical Social Work Assessment  Patient Details  Name: Courtney [name] MRN: [id number] Date of Birth: 01-Feb-1931  Date of referral:  10/03/15               Reason for consult:  Facility Placement                Permission sought to share information with:  Facility Medical sales representativeContact Representative, Family Supports Permission granted to share information::   (Patient oriented to self.)  Name::     Derl BarrowCheryl Kennedy  [facility]  Relationship::  daughter  Contact Information:  [phone]  Housing/Transportation Living arrangements for the past 2 months:  Single Family Home Source of Information:  Adult Children Patient Interpreter Needed:  None Criminal Activity/Legal Involvement Pertinent to Current Situation/Hospitalization:  No - Comment as needed Significant Relationships:  Adult Children Lives with:  Adult Children Do you feel safe going back to the place where you live?  Yes Need for family participation in patient care:  Yes (Comment) (Patient's daughter active in patient's care.)  Care giving concerns:  Patient's daughter expressed no concerns at this time.   Social Worker assessment / plan:  LCSW received referral for possible SNF placement at time of discharge. LCSW spoke with patient's daughter regarding discharge planning. Patient's daughter expressed understanding of PT recommendation for SNF placement at time of discharge. LCSW to continue to follow and assist with discharge planning needs.  Employment status:  Retired Health and safety inspectornsurance information:  Medicare PT Recommendations:  Skilled Nursing Facility Information / Referral to community resources:  Skilled Nursing Facility  Patient/Family's Response to care:  Patient's daughter understanding and agreeable to Johnson & JohnsonLCSW plan of care.  Patient/Family's Understanding of and Emotional Response to Diagnosis, Current Treatment, and Prognosis:  Patient's daughter understanding and agreeable to LCSW plan of care.  Emotional  Assessment Appearance:  Appears stated age Attitude/Demeanor/Rapport:  Other (LCSW spoke with patient's daughter.) Affect (typically observed):  Other (LCSW spoke with patient's daughter.) Orientation:  Oriented to Self Alcohol / Substance use:  Not Applicable Psych involvement (Current and /or in the community):  No (Comment) (Not appropriate on this admission.)  Discharge Needs  Concerns to be addressed:  No discharge needs identified Readmission within the last 30 days:  No Current discharge risk:  None Barriers to Discharge:  No Barriers Identified   Rod MaeVaughn, Margrette Wynia S, LCSW 10/03/2015, 1:19 PM [phone]

## 2015-10-03 NOTE — Clinical Social Work Note (Signed)
[facility] Patient's daughter updated regarding discharge. Report provided to RN. Transportation: EMS  Marcelline Deistmily Brieann Osinski, [place] [phone] Orthopedics: [phone] Surgical: [phone]

## 2015-10-03 NOTE — NC FL2 (Signed)
[place] MEDICAID FL2 LEVEL OF CARE SCREENING TOOL     IDENTIFICATION  Patient Name: Courtney [name] Birthdate: 1930-07-12 Sex: female Admission Date (Current Location): 09/30/2015  [facility] and [place] Number:  Producer, television/film/video and Address:  The [place]. [facility], [address] [place], [place] [address]      Provider Number: [id number]  Attending Physician Name and Address:  Alison Murray, MD  Relative Name and Phone Number:       Current Level of Care: Hospital Recommended Level of Care: Skilled Nursing Facility Prior Approval Number:    Date Approved/Denied:   PASRR Number: [id number]  Discharge Plan: SNF    Current Diagnoses: Patient Active Problem List   Diagnosis Date Noted  . Hip fracture (HCC) 09/30/2015  . Hypertension 09/30/2015  . Bradycardia 09/30/2015  . Hypokalemia 09/30/2015  . Coronary artery disease involving native coronary artery without angina pectoris 09/21/2015  . ESRD on dialysis (HCC) 09/12/2015  . CAD (coronary artery disease) of artery bypass graft 07/24/2013  . Dyslipidemia 07/24/2013  . Hypertension, renal disease   . Carotid disease, bilateral (HCC)   . Ischemic cardiomyopathy   . Mechanical complication of other vascular device, implant, and graft 11/24/2012  . Leg pain 07/11/2011  . Other complications due to renal dialysis device, implant, and graft 07/11/2011  . End stage renal disease (HCC) 07/11/2011  . VIN III (vulvar intraepithelial neoplasia III) 05/22/2011  . Carcinoma in situ of breast and genitourinary system 04/11/2011  . Kidney disease 03/12/2011  . Peripheral vascular disease (HCC) 03/13/2008    Orientation RESPIRATION BLADDER Height & Weight     Self  Normal Continent Weight: 90 lb 6.2 oz (41 kg) Height:     BEHAVIORAL SYMPTOMS/MOOD NEUROLOGICAL BOWEL NUTRITION STATUS      Continent Diet (Please see discharge summary.)  AMBULATORY STATUS COMMUNICATION OF NEEDS Skin   Extensive Assist  Verbally Surgical wounds                       Personal Care Assistance Level of Assistance  Bathing, Feeding, Dressing Bathing Assistance: Maximum assistance Feeding assistance: Limited assistance Dressing Assistance: Maximum assistance     Functional Limitations Info             SPECIAL CARE FACTORS FREQUENCY  PT (By licensed PT), OT (By licensed OT) (Dialysis every MWF at [facility]     PT Frequency: 5 OT Frequency: 5            Contractures      Additional Factors Info  Code Status, Allergies Code Status Info: DNR Allergies Info: Aspirin, Pletal           Current Medications (10/03/2015):  This is the current hospital active medication list Current Facility-Administered Medications  Medication Dose Route Frequency Provider Last Rate Last Dose  . acetaminophen (TYLENOL) tablet 650 mg  650 mg Oral Q6H PRN Rolly Salter, MD   650 mg at 10/03/15 2536   Or  . acetaminophen (TYLENOL) suppository 650 mg  650 mg Rectal Q6H PRN Rolly Salter, MD      . aspirin EC tablet 325 mg  325 mg Oral Daily Rolly Salter, MD   325 mg at 10/03/15 0939  . atorvastatin (LIPITOR) tablet 80 mg  80 mg Oral Daily Meredith Pel, NP   80 mg at 10/03/15 0939  . bisacodyl (DULCOLAX) EC tablet 5 mg  5 mg Oral Daily PRN  Meredith PelPaula M Guenther, NP      . bisacodyl (DULCOLAX) suppository 10 mg  10 mg Rectal Daily PRN Meredith PelPaula M Guenther, NP      . calcium acetate (PHOSLO) capsule 1,334 mg  1,334 mg Oral TID WC Annie SableKellie Goldsborough, MD   1,334 mg at 10/03/15 [id number]  . carvedilol (COREG) tablet 6.25 mg  6.25 mg Oral BID WC Annie SableKellie Goldsborough, MD      . clopidogrel (PLAVIX) tablet 75 mg  75 mg Oral Daily Rolly SalterPranav M Patel, MD   75 mg at 10/03/15 0939  . Darbepoetin Alfa (ARANESP) injection 60 mcg  60 mcg Intravenous Q Mon-HD Annie SableKellie Goldsborough, MD   60 mcg at 10/02/15 1821  . doxercalciferol (HECTOROL) injection 2 mcg  2 mcg Intravenous Q M,W,F-HD Weston SettleMartha Bergman, PA-C   2 mcg at  10/02/15 1821  . feeding supplement (NEPRO CARB STEADY) liquid 237 mL  237 mL Oral BID BM Pearson GrippeJames Kim, MD   237 mL at 09/30/15 1400  . [START ON 10/04/2015] ferric gluconate (NULECIT) 62.5 mg in sodium chloride 0.9 % 100 mL IVPB  62.5 mg Intravenous Q Wed-HD Weston SettleMartha Bergman, PA-C      . HYDROcodone-acetaminophen (NORCO/VICODIN) 5-325 MG per tablet 1-2 tablet  1-2 tablet Oral Q6H PRN Meredith PelPaula M Guenther, NP   2 tablet at 10/01/15 0519  . latanoprost (XALATAN) 0.005 % ophthalmic solution 1 drop  1 drop Both Eyes QHS Meredith PelPaula M Guenther, NP   1 drop at 10/02/15 2200  . menthol-cetylpyridinium (CEPACOL) lozenge 3 mg  1 lozenge Oral PRN Rolly SalterPranav M Patel, MD       Or  . phenol (CHLORASEPTIC) mouth spray 1 spray  1 spray Mouth/Throat PRN Rolly SalterPranav M Patel, MD      . morphine 2 MG/ML injection 0.5 mg  0.5 mg Intravenous Q2H PRN Meredith PelPaula M Guenther, NP   0.5 mg at 09/30/15 1803  . ondansetron (ZOFRAN) tablet 4 mg  4 mg Oral Q6H PRN Meredith PelPaula M Guenther, NP       Or  . ondansetron [facility](ZOFRAN) injection 4 mg  4 mg Intravenous Q6H PRN Meredith PelPaula M Guenther, NP   4 mg at 10/01/15 0854  . potassium chloride (K-DUR,KLOR-CON) CR tablet 30 mEq  30 mEq Oral Once Meredith PelPaula M Guenther, NP   30 mEq at 09/30/15 1804  . senna-docusate (Senokot-S) tablet 1 tablet  1 tablet Oral QHS PRN Meredith PelPaula M Guenther, NP         Discharge Medications: Please see discharge summary for a list of discharge medications.  Relevant Imaging Results:  Relevant Lab Results:   Additional Information SSN: [id number]  Rod MaeVaughn, Akon Reinoso S, LCSW

## 2015-10-03 NOTE — Progress Notes (Signed)
Subjective:  HD late yesterday- did not remove any fluid due to low BP- pleasantly confused- does not remember HD yest Objective Vital signs in last 24 hours: Filed Vitals:   10/02/15 1930 10/02/15 1950 10/02/15 2101 10/03/15 0617  BP: 84/55 106/64 147/27 88/60  Pulse: 72 77 74 66  Temp:  98 F (36.7 C) 98.2 F (36.8 C) 98.1 F (36.7 C)  TempSrc:   Oral Oral  Resp: Weight:      SpO2:   94% 90%   Weight change:   Intake/Output Summary (Last 24 hours) at 10/03/15 1610 Last data filed at 10/02/15 1950  Gross per 24 hour  Intake      0 ml  Output   -200 ml  Net    200 ml    Dialysis: [facility] 3.5h 37kg 2/2.25 bath P4 Hep 1400 LUA AVF  Assessment/ Plan: Pt is a 82 y.o. yo female with ESRD who was admitted on 09/30/2015 with s/p fall, hip fracture/ORIF   Assessment/Plan: 1. S/p hip fx- ORIF on 5/22 2. ESRD - normally MWF at [facility] via AVF- HD next tomorrow- no heparin   3. Anemia- pre op over 11, now in the 9's- added ESA 4. Secondary hyperparathyroidism- cont phoslo - will increase dose some given phos of 7.6 5. HTN/volume- BP good- coreg only - will tae dose down some given BP in the 80's at times 6. Dispo- planning on discharge to SNF- not sure when will order HD for first shift tomorrow in case plans made   Naren Benally A    Labs: Basic Metabolic Panel:  Recent Labs Lab 09/30/15 0520 10/01/15 0515 10/02/15 0342  NA 139 137 137  K 3.2* 4.4 4.3  CL 98* 96* 99*  CO2 GLUCOSE 134* 125* 90  BUN 20 37* 48*  CREATININE 5.05* 7.06* 8.48*  CALCIUM 9.1 9.3 8.5*  PHOS  --   --  7.6*   Liver Function Tests:  Recent Labs Lab 10/02/15 0342  ALBUMIN 2.6*   No results for input(s): LIPASE, AMYLASE in the last 168 hours. No results for input(s): AMMONIA in the last 168 hours. CBC:  Recent Labs Lab 09/30/15 0520 10/01/15 0515 10/02/15 0342 10/03/15 0704  WBC 7.5 7.3 7.2 6.2  NEUTROABS 6.3  --  5.7 4.7  HGB 11.1* 11.5* 9.2*  8.7*  HCT 34.8* 35.6* 28.5* 26.1*  MCV 84.7 84.0 83.3 81.8  PLT 154 177 143* 136*   Cardiac Enzymes: No results for input(s): CKTOTAL, CKMB, CKMBINDEX, TROPONINI in the last 168 hours. CBG: No results for input(s): GLUCAP in the last 168 hours.  Iron Studies: No results for input(s): IRON, TIBC, TRANSFERRIN, FERRITIN in the last 72 hours. Studies/Results: No results found. Medications: Infusions:    Scheduled Medications: . aspirin EC  325 mg Oral Daily  . atorvastatin  80 mg Oral Daily  . calcium acetate  1,334 mg Oral TID WC  . carvedilol  12.5 mg Oral BID WC  . clopidogrel  75 mg Oral Daily  . darbepoetin (ARANESP) injection - DIALYSIS  60 mcg Intravenous Q Mon-HD  . doxercalciferol  2 mcg Intravenous Q M,W,F-HD  . feeding supplement (NEPRO CARB STEADY)  237 mL Oral BID BM  . [START ON 10/04/2015] ferric gluconate (FERRLECIT/NULECIT) IV  62.5 mg Intravenous Q Wed-HD  . latanoprost  1 drop Both Eyes QHS  . potassium chloride  30 mEq Oral Once    have reviewed scheduled and prn  medications.  Physical Exam: General: thin, NAD- wondering where her belongings are  Heart: RRR Lungs: mostly clear  Abdomen: soft, non tender Extremities: no edema Dialysis Access: left arm AVF with good thrill and bruit     10/03/2015,9:52 AM  LOS: 3 days

## 2015-10-03 NOTE — Progress Notes (Signed)
   Assessment/Plan: 2 Days Post-Op   POD# 2 S/P Left INTRAMEDULLARY (IM) NAIL INTERTROCHANTRIC by Dr. Jewel Baizeimothy D. Eulah [name] on 10/01/15  Principal Problem:   Hip fracture (HCC) Active Problems:   Dyslipidemia   Ischemic cardiomyopathy   ESRD on dialysis [facility](HCC)   Coronary artery disease involving native coronary artery without angina pectoris   Hypertension   Bradycardia   Hypokalemia Acute blood loss anemia, likely dilutional component.  Procedural blood loss minimal.  PLAN: Up with therapy Weight Bearing: Weight Bearing as Tolerated (WBAT)  Dressings: Dry dressings prn VTE prophylaxis: SCDs, on Plavix, Patient declines ASA 325 x30 days dt itching allergy Dispo: Per primary.  Subjective: Feeling well.  Patient reports pain as mild..  No CP, SOB.  Appetite good.  Objective:   VITALS:   Filed Vitals:   10/02/15 1930 10/02/15 1950 10/02/15 2101 10/03/15 0617  BP: 84/55 106/64 147/27 88/60  Pulse: 72 77 74 66  Temp:  98 F (36.7 C) 98.2 F (36.8 C) 98.1 F (36.7 C)  TempSrc:   Oral Oral  Resp: 20 20  20   Weight:      SpO2:   94% 90%    Lab Results  Component Value Date   WBC 7.2 10/02/2015   HGB 9.2* 10/02/2015   HCT 28.5* 10/02/2015   MCV 83.3 10/02/2015   PLT 143* 10/02/2015   BMET    Component Value Date/Time   NA 137 10/02/2015 0342   K 4.3 10/02/2015 0342   CL 99* 10/02/2015 0342   CO2 23 10/02/2015 0342   GLUCOSE 90 10/02/2015 0342   BUN 48* 10/02/2015 0342   CREATININE 8.48* 10/02/2015 0342   CALCIUM 8.5* 10/02/2015 0342   GFRNONAA 4* 10/02/2015 0342   GFRAA 4* 10/02/2015 0342   Imaging: Post op XR Pelvis shows near anatomic alignment of fracture.  Physical Exam General: NAD.  Resting comfortably on arrival Neurovascular intact Sensation intact distally Intact pulses distally Dorsiflexion/Plantar flexion intact Incision: dressing C/D/I and no drainage Extremities: Warm, dry Homans sign is negative, no sign of DVT Dressing: C/D/I    Courtney [name]  Courtney [name] 10/03/2015, 7:33 AM

## 2015-10-03 NOTE — Care Management Important Message (Signed)
Important Message  Patient Details  Name: Courtney [name] MRN: [id number] Date of Birth: 1931-01-01   Medicare Important Message Given:  Yes    Courtney [name], Courtney [name] 10/03/2015, 2:17 PM

## 2015-10-03 NOTE — Care Management Note (Signed)
Case Management Note  Patient Details  Name: Courtney [name] MRN: [id number] Date of Birth: Sep 10, 1930  Subjective/Objective:    82 yr old female s/p left hip IM Nailing, after a fall.                Action/Plan:  Case manager spoke with patient's daughter- Derl BarrowCheryl Kennedy, concerning discharge plan. She states she would prefer patient come home but knows she needs more care. Is willing to check into shortterm rehab. CM will notify Child psychotherapistsocial worker.  Expected Discharge Date:   10/04/15               Expected Discharge Plan:   Skilled Nursing Facility  In-House Referral:     Discharge planning Services  CM Consult  Post Acute Care Choice:  NA [facility]  [facility]  DME Arranged:    DME Agency:     HH Arranged:   NA HH Agency:     Status of Service:  In process, will continue to follow  Medicare Important Message Given:    Date Medicare IM Given:    Medicare IM give by:    Date Additional Medicare IM Given:    Additional Medicare Important Message give by:     If discussed at Long Length of Stay Meetings, dates discussed:    Additional Comments:  [name], [name] [name], RN 10/03/2015, 10:30 AM

## 2015-10-03 NOTE — Discharge Instructions (Signed)

## 2015-10-04 ENCOUNTER — Encounter: Payer: Self-pay | Admitting: Internal Medicine

## 2015-10-04 ENCOUNTER — Non-Acute Institutional Stay (SKILLED_NURSING_FACILITY): Payer: Medicare Other | Admitting: Internal Medicine

## 2015-10-04 DIAGNOSIS — K5900 Constipation, unspecified: Secondary | ICD-10-CM | POA: Diagnosis not present

## 2015-10-04 DIAGNOSIS — R4189 Other symptoms and signs involving cognitive functions and awareness: Secondary | ICD-10-CM

## 2015-10-04 DIAGNOSIS — D638 Anemia in other chronic diseases classified elsewhere: Secondary | ICD-10-CM

## 2015-10-04 DIAGNOSIS — E46 Unspecified protein-calorie malnutrition: Secondary | ICD-10-CM | POA: Diagnosis not present

## 2015-10-04 DIAGNOSIS — S72142S Displaced intertrochanteric fracture of left femur, sequela: Secondary | ICD-10-CM | POA: Diagnosis not present

## 2015-10-04 DIAGNOSIS — N186 End stage renal disease: Secondary | ICD-10-CM | POA: Diagnosis not present

## 2015-10-04 DIAGNOSIS — I10 Essential (primary) hypertension: Secondary | ICD-10-CM

## 2015-10-04 DIAGNOSIS — I2510 Atherosclerotic heart disease of native coronary artery without angina pectoris: Secondary | ICD-10-CM

## 2015-10-04 DIAGNOSIS — E785 Hyperlipidemia, unspecified: Secondary | ICD-10-CM

## 2015-10-04 DIAGNOSIS — Z992 Dependence on renal dialysis: Secondary | ICD-10-CM

## 2015-10-04 DIAGNOSIS — R5381 Other malaise: Secondary | ICD-10-CM

## 2015-10-04 NOTE — Progress Notes (Signed)
LOCATION: [facility]  PCP: [name], MD   Code Status: DNR  Goals of care: Advanced Directive information Advanced Directives 10/04/2015  Does patient have an advance directive? Yes  Type of Advance Directive Out of facility DNR (pink MOST or yellow form)  Does patient want to make changes to advanced directive? No - Patient declined  Copy of advanced directive(s) in chart? Yes       Extended Emergency Contact Information Primary Emergency Contact: Kennedy,Cheryl Address: [address]          [place], [place] [address] D[place] of [place] Home Phone: [phone] Mobile Phone: [phone] Relation: Daughter Secondary Emergency Contact: [facility] [place] Address: [address]          [place], [place] [address] [place] of [place] Home Phone: [phone] Relation: Niece   Allergies  Allergen Reactions  . Aspirin Itching  . Pletal [Cilostazol]     dizziness    Chief Complaint  Patient presents with  . New Admit To SNF    New Admission     HPI:  Patient is a 82 y.o. female seen today for short term rehabilitation post hospital admission from 09/30/15-10/03/15 post fall with left hip fracture. She underwent IM nailing on 10/01/15. She is seen in her room today. She has ESRD and is on HD and had dialysis this morning.   Review of Systems:  Constitutional: Negative for fever, chills, diaphoresis. Energy level is slowly improving.  HENT: Negative for headache, congestion, nasal discharge, hearing loss, sore throat, difficulty swallowing.   Eyes: Negative for blurred vision, double vision and discharge. Wears glasses.  Respiratory: Negative for cough, shortness of breath and wheezing.   Cardiovascular: Negative for chest pain, palpitations, leg swelling.  Gastrointestinal: Negative for heartburn, nausea, vomiting, abdominal pain, loss of appetite, melena, diarrhea and constipation. Last bowel movement was yesterday.  Genitourinary: Negative for dysuria and  flank pain.  Musculoskeletal: Negative for back pain, fall in the facility.  Skin: Negative for itching, rash.  Neurological: Negative for weakness and dizziness. Psychiatric/Behavioral: Negative for depression   Past Medical History  Diagnosis Date  . Myocardial infarction (HCC) 12-03-1995  . CAD in native artery 1997    CABG 1997, Last stress test 10/09 no ischemia  . S/P CABG x 5 1997    LIMA-LAD, SVG-OM, Seq SVG-RPDA-RPL  . Ischemic cardiomyopathy     EF by echo 11/2010 EF 35-45%, LA is moderately dilated, mod. MR, mod. to severe TR, RV systolic pressure 40-50 mmHg  . Moderate mitral regurgitation 11/2010  . Stroke, lacunar (HCC) 1994  . Carotid disease, bilateral (HCC)     Most recent Dopplers April 2014: Right carotid 50-69% (lower range);  lleft less than 50%.  . Hypertension, renal disease   . Dyslipidemia   . 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 Peripheral vascular disease (HCC) 03/2008    Stents to rt SFA, Dopplers from April 2014: No significant change, last PV cath 03/29/08  -- fairly widely patent R. SFA stent. Left SFA less than 50%. Proximal but occluded in the mid vessel with reconstitution distally. Right PT and AP occluded as described previously an angiography. Left AP occluded also noted angiographically   . Anemia  due to chronic renal failure treated with erythropoietin   . Vulvar lesion   . Osteoarthritis   . Hx of sickle cell trait   . H/O pulmonary fibrosis   . Shortness of breath     RELATED TO DIALYSIS -TOO MUCH FLUID  . Alzheimer's dementia   . Hypertension    Past Surgical History  Procedure Laterality Date  . Cholecystectomy    . Coronary artery bypass graft  1997    LIMA-LAD; seq VG-PDA&PLA; VG-OM  . Vulvar  lesion removal  04/23/2011    Procedure: VULVAR LESION;  Surgeon: Laurette Schimke, MD;  Location: [facility] ORS;  Service: Gynecology;  Laterality: N/A;  Wide Local Excision Of Vulvar Lesion   . Femoral artery stent Right 03/2008    6 mm X 150 mm + 6 mm x 100 mm SMART stent;;   . Moles       from thigh s removed.  Marland Kitchen Av fistula placement Right 2001    AVF  . Abdominal aortagram N/A 07/18/2011    Procedure: ABDOMINAL Ronny Flurry;  Surgeon: Fransisco Hertz, MD;  Location: [facility] CATH LAB;  Service: Cardiovascular;  Laterality: N/A;  . Shuntogram N/A 09/05/2011    Procedure: Betsey Amen;  Surgeon: Fransisco Hertz, MD;  Location: [facility] CATH LAB;  Service: Cardiovascular;  Laterality: N/A;  . Ligation of arteriovenous  fistula Right 09/21/2015    Procedure: LIGATION OF RIGHT UPPER ARM ARTERIOVENOUS  FISTULA;  Surgeon: Pryor Ochoa, MD;  Location: [facility] OR;  Service: Vascular;  Laterality: Right;  . Intramedullary (im) nail intertrochanteric Left 10/01/2015    Procedure: INTRAMEDULLARY (IM) NAIL INTERTROCHANTRIC;  Surgeon: Sheral Apley, MD;  Location: [facility] OR;  Service: Orthopedics;  Laterality: Left;   Social History:   reports that she quit smoking about 23 years ago. Her smoking use included Cigarettes. She has never used smokeless tobacco. She reports that she does not drink alcohol or use illicit drugs.  Family History  Problem Relation Age of Onset  . Colon cancer Neg Hx   . Anesthesia problems Neg Hx   . Heart disease Sister   . Sudden death Sister 66  . Heart disease Mother   . Sudden death Mother 5  . Sudden death Father 34  . Stroke Father   . Sudden death Brother 36  . Heart disease Brother     Medications:   Medication List       This list is accurate as of: 10/04/15 12:52 PM.  Always use your most recent med list.               acetaminophen 325 MG tablet  Commonly known as:  TYLENOL  Take 2 tablets (650 mg total) by mouth every 6 (six) hours as needed for mild pain (or Fever >/= 101).      aspirin 325 MG EC tablet  Take 1 tablet (325 mg total) by mouth daily.     atorvastatin 80 MG tablet  Commonly known as:  LIPITOR  Take 1 tablet (80 mg total) by mouth daily.     calcium acetate 667 MG capsule  Commonly known as:  PHOSLO  Take 667 mg by mouth 3 (three) times daily with meals.     carvedilol 12.5 MG tablet  Commonly known as:  COREG  Take 1 tablet (12.5 mg total) by mouth 2 (two) times daily with a meal.     clopidogrel 75 MG tablet  Commonly known as:  PLAVIX  TAKE ONE TABLET BY  MOUTH ONCE DAILY     DIALYVITE 800 PO  Take 1 capsule by mouth daily.     DRY EYES OP  Place 1 drop into both eyes daily as needed (for dry eyes).     feeding supplement (NEPRO CARB STEADY) Liqd  Take 237 mLs by mouth 2 (two) times daily between meals.     latanoprost 0.005 % ophthalmic solution  Commonly known as:  XALATAN  Place 1 drop into both eyes at bedtime.     senna-docusate 8.6-50 MG tablet  Commonly known as:  Senokot-S  Take 1 tablet by mouth at bedtime as needed for mild constipation.     Travoprost (BAK Free) 0.004 % Soln ophthalmic solution  Commonly known as:  TRAVATAN  Place 1 drop into both eyes at bedtime.        Immunizations:  There is no immunization history on file for this patient.   Physical Exam: Filed Vitals:   10/04/15 1234  BP: 151/79  Pulse: 73  Temp: 98.1 F (36.7 C)  TempSrc: Oral  Resp: 17  Height: 5\' 2"  (1.575 m)  Weight: 90 lb 4.8 oz (40.96 kg)  SpO2: 99%   Body mass index is 16.51 kg/(m^2).  General- elderly female, frail and thin built, in no acute distress Head- normocephalic, atraumatic Nose-  no maxillary or frontal sinus tenderness, no nasal discharge Throat- moist mucus membrane, wears dentures Eyes- PERRLA, EOMI, no pallor, no icterus, no discharge, normal conjunctiva, normal sclera Neck- no cervical lymphadenopathy Cardiovascular- normal s1,s2, no murmur, no leg edema Respiratory- bilateral clear to auscultation,  no wheeze, no rhonchi, no crackles, no use of accessory muscles Abdomen- bowel sounds present, soft, non tender Musculoskeletal- able to move all 4 extremities, limited left leg range of motion Neurological- alert and oriented to person only Skin- warm and dry, left hip surgical incision with dressing in place, left arm AV fistula Psychiatry- normal mood and affect    Labs reviewed: Basic Metabolic Panel:  Recent Labs  54/09/81 0520 10/01/15 0515 10/02/15 10/02/15 0342  NA 139 137 137 137  K 3.2* 4.4  --  4.3  CL 98* 96*  --  99*  CO2 30 28  --  23  GLUCOSE 134* 125*  --  90  BUN 20 37* 48* 48*  CREATININE 5.05* 7.06* 8.5* 8.48*  CALCIUM 9.1 9.3  --  8.5*  MG  --   --   --  2.0  PHOS  --   --   --  7.6*   Liver Function Tests:  Recent Labs  10/02/15 0342  ALBUMIN 2.6*   No results for input(s): LIPASE, AMYLASE in the last 8760 hours. No results for input(s): AMMONIA in the last 8760 hours. CBC:  Recent Labs  09/30/15 0520 10/01/15 0515 10/02/15 0342 10/03/15 10/03/15 0704  WBC 7.5 7.3 7.2 6.2 6.2  NEUTROABS 6.3  --  5.7  --  4.7  HGB 11.1* 11.5* 9.2*  --  8.7*  HCT 34.8* 35.6* 28.5*  --  26.1*  MCV 84.7 84.0 83.3  --  81.8  PLT 154 177 143*  --  136*   Cardiac Enzymes: No results for input(s): CKTOTAL, CKMB, CKMBINDEX, TROPONINI in the last 8760 hours. BNP: Invalid input(s): POCBNP CBG:  Recent Labs  09/21/15 1100 09/21/15 1129 09/21/15 1139  GLUCAP 65 54* 76    Radiological Exams: Dg Chest 1 View  09/30/2015  CLINICAL DATA:  Fall, left hip deformity EXAM: CHEST 1 VIEW COMPARISON:  04/18/2011  FINDINGS: Chronic interstitial markings/emphysematous changes with scarring in the right upper lobe. No focal consolidation. No pleural effusion or pneumothorax. The heart is normal in size. Postsurgical changes related to prior CABG. Median sternotomy. IMPRESSION: No evidence of acute cardiopulmonary disease. Electronically Signed   By: Charline Bills M.D.    On: 09/30/2015 07:14   Ct Head Wo Contrast  09/30/2015  CLINICAL DATA:  Fall this morning. EXAM: CT HEAD WITHOUT CONTRAST CT CERVICAL SPINE WITHOUT CONTRAST TECHNIQUE: Multidetector CT imaging of the head and cervical spine was performed following the standard protocol without intravenous contrast. Multiplanar CT image reconstructions of the cervical spine were also generated. COMPARISON:  None. FINDINGS: CT HEAD FINDINGS The ventricles are normal in configuration. There is ventricular and sulcal enlargement reflecting mild, diffuse atrophy. There are no parenchymal masses or mass effect. Patchy white matter hypoattenuation is noted consistent with moderate chronic microvascular ischemic change. There is no evidence of a recent cortical infarct. There are no extra-axial masses or abnormal fluid collections. There is no intracranial hemorrhage. The visualized sinuses and mastoid air cells are clear. No skull fracture. CT CERVICAL SPINE FINDINGS No fracture. Slight, grade 1, anterolisthesis of C4 on C5. There is straightening of the normal cervical lordosis. Mild loss of disc height at C3-C4. Moderate loss disc height at C4-C5, C5-C6 and C6-C7. There is facet degenerative change most evident on the right at C3-C4 and C4-C5. The bones are diffusely demineralized. Soft tissues show vascular calcifications but are otherwise unremarkable. Lung apices demonstrate emphysema and scarring. IMPRESSION: HEAD CT:  No acute intracranial abnormalities.  No skull fracture. CERVICAL CT:  No fracture or acute finding. Electronically Signed   By: Amie Portland M.D.   On: 09/30/2015 07:11   Ct Cervical Spine Wo Contrast  09/30/2015  CLINICAL DATA:  Fall this morning. EXAM: CT HEAD WITHOUT CONTRAST CT CERVICAL SPINE WITHOUT CONTRAST TECHNIQUE: Multidetector CT imaging of the head and cervical spine was performed following the standard protocol without intravenous contrast. Multiplanar CT image reconstructions of the cervical  spine were also generated. COMPARISON:  None. FINDINGS: CT HEAD FINDINGS The ventricles are normal in configuration. There is ventricular and sulcal enlargement reflecting mild, diffuse atrophy. There are no parenchymal masses or mass effect. Patchy white matter hypoattenuation is noted consistent with moderate chronic microvascular ischemic change. There is no evidence of a recent cortical infarct. There are no extra-axial masses or abnormal fluid collections. There is no intracranial hemorrhage. The visualized sinuses and mastoid air cells are clear. No skull fracture. CT CERVICAL SPINE FINDINGS No fracture. Slight, grade 1, anterolisthesis of C4 on C5. There is straightening of the normal cervical lordosis. Mild loss of disc height at C3-C4. Moderate loss disc height at C4-C5, C5-C6 and C6-C7. There is facet degenerative change most evident on the right at C3-C4 and C4-C5. The bones are diffusely demineralized. Soft tissues show vascular calcifications but are otherwise unremarkable. Lung apices demonstrate emphysema and scarring. IMPRESSION: HEAD CT:  No acute intracranial abnormalities.  No skull fracture. CERVICAL CT:  No fracture or acute finding. Electronically Signed   By: Amie Portland M.D.   On: 09/30/2015 07:11   Pelvis Portable  10/01/2015  CLINICAL DATA:  Postop day 0 ORIF intertrochanteric left femoral neck fracture. EXAM: PORTABLE PELVIS 1-2 VIEWS COMPARISON:  Intraoperative left hip x-rays earlier today 8:49 a.m. and left hip x-rays yesterday. FINDINGS: Portable AP views of the pelvis and left femur were obtained. ORIF of the intertrochanteric left femoral neck fracture with intramedullary nail  and compression screw. Near anatomic alignment. No acute complicating features. IMPRESSION: Near anatomic alignment post ORIF of the intertrochanteric left femoral neck fracture. No acute complicating features. Electronically Signed   By: Hulan Saashomas  Lawrence M.D.   On: 10/01/2015 11:58   Dg Hip Operative  Unilat W Or W/o Pelvis Left  10/01/2015  CLINICAL DATA:  Status post hip repair. FLUOROSCOPY TIME:  45 seconds. Images: 3. EXAM: OPERATIVE left HIP (WITH PELVIS IF PERFORMED) 3 VIEWS TECHNIQUE: Fluoroscopic spot image(s) were submitted for interpretation post-operatively. COMPARISON:  Sep 30, 2015 FINDINGS: A gamma nail and rod have been placed across the left hip fracture. Hardware is in good position. IMPRESSION: Appropriate placement of hardware across the left hip fracture. Electronically Signed   By: Gerome Samavid  Broussard III M.D   On: 10/01/2015 11:12   Dg Hip Unilat W Or W/o Pelvis 2-3 Views Left  09/30/2015  CLINICAL DATA:  Fall, left hip deformity EXAM: DG HIP (WITH OR WITHOUT PELVIS) 2-3V LEFT COMPARISON:  None. FINDINGS: Intertrochanteric left hip fracture. Varus angulation with foreshortening. Bilateral hip joint spaces are preserved. Bony pelvis appears intact. Vascular calcifications. Left iliac stent. Additional vascular stent in the right medial thigh. IMPRESSION: Intertrochanteric left hip fracture, as above. Electronically Signed   By: Charline BillsSriyesh  Krishnan M.D.   On: 09/30/2015 07:16    Assessment/Plan  Physical deconditioning Will have her work with physical therapy and occupational therapy team to help with gait training and muscle strengthening exercises.fall precautions. Skin care. Encourage to be out of bed.   Left hip fracture S/p IM nailing. Has f/u with orthopedics. Continue aspirin for dvt prophylaxis. Continue tylenol 325 mg 2 tab q6h prn for pain. Will have patient work with PT/OT as tolerated to regain strength and restore function.  Fall precautions are in place.  ESRD Continue HD 3 days a week. Continue phoslo  Anemia of chronic disease With her renal disease. Monitor cbc  HLD Continue lipitor  HTN Monitor bp, continue coreg and check bmp  CAD Chest pain free. Continue coreg, aspirin, plavix and statin  Protein calorie malnutrition Continue feeding supplement and  get dietary consult  Constipation Stable, continue senokot s  Cognitive impairment No known diagnosis of dementia. Get MMSE checked. Get tsh and b12 checked. Assistance with her ADLs. Fall precautions  Goals of care: short term rehabilitation   Labs/tests ordered: cbc, cmp 10/10/15  Family/ staff Communication: reviewed care plan with patient and nursing supervisor    Oneal GroutMAHIMA Allegra Cerniglia, MD Internal Medicine [facility] [address] [place], [place] [address] Cell Phone (Monday-Friday 8 am - 5 pm): [phone] On Call: [phone] and follow prompts after 5 pm and on weekends Office Phone: [phone] Office Fax: [phone]

## 2015-10-05 ENCOUNTER — Encounter: Payer: Self-pay | Admitting: Family

## 2015-10-05 ENCOUNTER — Non-Acute Institutional Stay (SKILLED_NURSING_FACILITY): Payer: Medicare Other | Admitting: Family

## 2015-10-05 DIAGNOSIS — W19XXXS Unspecified fall, sequela: Secondary | ICD-10-CM | POA: Diagnosis not present

## 2015-10-05 DIAGNOSIS — Z8781 Personal history of (healed) traumatic fracture: Secondary | ICD-10-CM | POA: Insufficient documentation

## 2015-10-05 DIAGNOSIS — IMO0001 Reserved for inherently not codable concepts without codable children: Secondary | ICD-10-CM

## 2015-10-05 NOTE — Progress Notes (Signed)
Patient ID: Courtney [name], female   DOB: 01-26-1931, 82 y.o.   MRN: [id number]  Location:  [facility] Room Number: 705 Place of Service:  SNF (31) Provider: Robyn Nohr FNP-C   Oneal Grout, MD  Patient Care Team: Oneal Grout, MD as PCP - General (Internal Medicine) Elvis Coil, MD (Nephrology) Marykay Lex, MD as Resident (Cardiology) Terrial Rhodes, MD (Nephrology)  Extended Emergency Contact Information Primary Emergency Contact: [facility] Address: [address]          [place], [place] [address] [place] Home Phone: [phone] Mobile Phone: [phone] Relation: Daughter Secondary Emergency Contact: [facility] Address: [address]          [place], [place] [address] [place] Home Phone: [phone] Relation: Niece  Code Status:  DNR  Goals of care: Advanced Directive information Advanced Directives 10/05/2015  Does patient have an advance directive? Yes  Type of Advance Directive Out of facility DNR (pink MOST or yellow form)  Does patient want to make changes to advanced directive? -  Copy of advanced directive(s) in chart? Yes     Chief Complaint  Patient presents with  . Acute Visit    Evaluation after fall    HPI:  Pt is a 82 y.o. female seen today at [facility] for an acute visit for follow up fall. She is S/p left hip intramedullary Nail 10/01/2015. She has a significant history of HTN,PVD, CAD, ESRD on HD (Mon, Wed,Fri) among others. She is seen in her room today per facility Nurse request. Facility Nurse reports patient was found sitting on the floor no injuries sustained. Patient is pleasantly confused states " I don't know what I'm saying I think I've dementia". She holds left thigh due to pain. Pain medication given by Nurse.    Past Medical History  Diagnosis Date  . Myocardial infarction (HCC) 12-03-1995  . CAD in native artery 1997    CABG 1997, Last  stress test 10/09 no ischemia  . S/P CABG x 5 1997    LIMA-LAD, SVG-OM, Seq SVG-RPDA-RPL  . Ischemic cardiomyopathy     EF by echo 11/2010 EF 35-45%, LA is moderately dilated, mod. MR, mod. to severe TR, RV systolic pressure 40-50 mmHg  . Moderate mitral regurgitation 11/2010  . Stroke, lacunar (HCC) 1994  . Carotid disease, bilateral (HCC)     Most recent Dopplers April 2014: Right carotid 50-69% (lower range);  lleft less than 50%.  . Hypertension, renal disease   . Dyslipidemia   . 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 Peripheral vascular disease (HCC) 03/2008    Stents to rt SFA, Dopplers from April 2014: No significant change, last PV cath 03/29/08  -- fairly widely patent R. SFA stent. Left SFA less than 50%. Proximal but occluded in the mid vessel with reconstitution distally. Right PT and AP occluded as described previously an angiography. Left AP occluded also noted angiographically   . Anemia due to chronic renal failure treated with erythropoietin   . Vulvar lesion   . Osteoarthritis   . Hx of sickle cell trait   . H/O pulmonary fibrosis   . Shortness of  breath     RELATED TO DIALYSIS -TOO MUCH FLUID  . Alzheimer's dementia   . Hypertension    Past Surgical History  Procedure Laterality Date  . Cholecystectomy    . Coronary artery bypass graft  1997    LIMA-LAD; seq VG-PDA&PLA; VG-OM  . Vulvar lesion removal  04/23/2011    Procedure: VULVAR LESION;  Surgeon: Laurette Schimke, MD;  Location: [facility] ORS;  Service: Gynecology;  Laterality: N/A;  Wide Local Excision Of Vulvar Lesion   . Femoral artery stent Right 03/2008    6 mm X 150 mm + 6 mm x 100 mm SMART stent;;   . Moles       from thigh s removed.  Marland Kitchen Av fistula placement Right 2001     AVF  . Abdominal aortagram N/A 07/18/2011    Procedure: ABDOMINAL Ronny Flurry;  Surgeon: Fransisco Hertz, MD;  Location: [facility] CATH LAB;  Service: Cardiovascular;  Laterality: N/A;  . Shuntogram N/A 09/05/2011    Procedure: Betsey Amen;  Surgeon: Fransisco Hertz, MD;  Location: [facility] CATH LAB;  Service: Cardiovascular;  Laterality: N/A;  . Ligation of arteriovenous  fistula Right 09/21/2015    Procedure: LIGATION OF RIGHT UPPER ARM ARTERIOVENOUS  FISTULA;  Surgeon: Pryor Ochoa, MD;  Location: [facility] OR;  Service: Vascular;  Laterality: Right;  . Intramedullary (im) nail intertrochanteric Left 10/01/2015    Procedure: INTRAMEDULLARY (IM) NAIL INTERTROCHANTRIC;  Surgeon: Sheral Apley, MD;  Location: [facility] OR;  Service: Orthopedics;  Laterality: Left;    Allergies  Allergen Reactions  . Aspirin Itching  . Pletal [Cilostazol]     dizziness      Medication List       This list is accurate as of: 10/05/15  4:14 PM.  Always use your most recent med list.               acetaminophen 325 MG tablet  Commonly known as:  TYLENOL  Take 2 tablets (650 mg total) by mouth every 6 (six) hours as needed for mild pain (or Fever >/= 101).     aspirin 325 MG EC tablet  Take 1 tablet (325 mg total) by mouth daily.     atorvastatin 80 MG tablet  Commonly known as:  LIPITOR  Take 1 tablet (80 mg total) by mouth daily.     calcium acetate 667 MG capsule  Commonly known as:  PHOSLO  Take 667 mg by mouth 3 (three) times daily with meals.     carvedilol 12.5 MG tablet  Commonly known as:  COREG  Take 1 tablet (12.5 mg total) by mouth 2 (two) times daily with a meal.     clopidogrel 75 MG tablet  Commonly known as:  PLAVIX  TAKE ONE TABLET BY MOUTH ONCE DAILY     DIALYVITE 800 PO  Take 1 capsule by mouth daily.     DRY EYES OP  Place 1 drop into both eyes daily as needed (for dry eyes).     feeding supplement (NEPRO CARB STEADY) Liqd  Take 237 mLs by mouth 2 (two) times daily between meals.     latanoprost  0.005 % ophthalmic solution  Commonly known as:  XALATAN  Place 1 drop into both eyes at bedtime.     senna-docusate 8.6-50 MG tablet  Commonly known as:  Senokot-S  Take 1 tablet by mouth at bedtime as needed for mild constipation.     Travoprost (BAK Free) 0.004 % Soln ophthalmic solution  Commonly  known as:  TRAVATAN  Place 1 drop into both eyes at bedtime.        Review of Systems  Constitutional: Negative for fever, chills, activity change, appetite change and fatigue.  Respiratory: Negative for cough, chest tightness, shortness of breath and wheezing.   Cardiovascular: Negative for chest pain, palpitations and leg swelling.  Gastrointestinal: Negative for nausea, vomiting, abdominal pain, diarrhea, constipation and abdominal distention.  Genitourinary: Negative for dysuria, urgency, frequency and flank pain.  Musculoskeletal: Positive for gait problem.  Skin: Negative.        Left hip surgical drsg   Neurological: Negative for dizziness, seizures, light-headedness and headaches.  Psychiatric/Behavioral: Negative for hallucinations, confusion, sleep disturbance and agitation.     There is no immunization history on file for this patient. Pertinent  Health Maintenance Due  Topic Date Due  . DEXA SCAN  05/22/1997  . PNA vac Low Risk Adult (1 of 2 - PCV13) 10/10/2016 (Originally 05/22/1997)  . INFLUENZA VACCINE  12/12/2015   No flowsheet data found. Functional Status Survey:    Filed Vitals:   10/05/15 0949  BP: 126/58  Pulse: 61  Temp: 98 F (36.7 C)  TempSrc: Oral  Resp: 19  Height:  (1.575 m)  Weight: 90 lb (40.824 kg)  SpO2: 95%   Body mass index is 16.46 kg/(m^2). Physical Exam  Constitutional:  Thin Frail Elderly.   HENT:  Head: Normocephalic.  Mouth/Throat: Oropharynx is clear and moist.  Eyes: Conjunctivae and EOM are normal. Pupils are equal, round, and reactive to light. Right eye exhibits no discharge. Left eye exhibits no discharge. No  scleral icterus.  Neck: Normal range of motion. No JVD present. No thyromegaly present.  Cardiovascular: Normal rate, regular rhythm, normal heart sounds and intact distal pulses.  Exam reveals no gallop and no friction rub.   No murmur heard. Pulmonary/Chest: Effort normal and breath sounds normal. No respiratory distress. She has no wheezes. She has no rales.  Abdominal: Soft. Bowel sounds are normal. She exhibits no distension. There is no tenderness. There is no rebound and no guarding.  Musculoskeletal: She exhibits no edema or tenderness.  Normal ROM except left hip limited due to pain.   Lymphadenopathy:    She has no cervical adenopathy.  Neurological: She is alert.  Skin: Skin is warm and dry. No rash noted. No erythema. No pallor.  Psychiatric: She has a normal mood and affect.    Labs reviewed:  Recent Labs  09/30/15 0520 10/01/15 0515 10/02/15 10/02/15 0342  NA 139 137 137 137  K 3.2* 4.4  --  4.3  CL 98* 96*  --  99*  CO2 30 28  --  23  GLUCOSE 134* 125*  --  90  BUN 20 37* 48* 48*  CREATININE 5.05* 7.06* 8.5* 8.48*  CALCIUM 9.1 9.3  --  8.5*  MG  --   --   --  2.0  PHOS  --   --   --  7.6*    Recent Labs  10/02/15 0342  ALBUMIN 2.6*    Recent Labs  09/30/15 0520 10/01/15 0515 10/02/15 0342 10/03/15 10/03/15 0704  WBC 7.5 7.3 7.2 6.2 6.2  NEUTROABS 6.3  --  5.7  --  4.7  HGB 11.1* 11.5* 9.2*  --  8.7*  HCT 34.8* 35.6* 28.5*  --  26.1*  MCV 84.7 84.0 83.3  --  81.8  PLT 154 177 143*  --  136*    No results found  for: HGBA1C No results found for: CHOL, HDL, LDLCALC, LDLDIRECT, TRIG, CHOLHDL  Significant Diagnostic Results in last 30 days:  Dg Chest 1 View  09/30/2015  CLINICAL DATA:  Fall, left hip deformity EXAM: CHEST 1 VIEW COMPARISON:  04/18/2011 FINDINGS: Chronic interstitial markings/emphysematous changes with scarring in the right upper lobe. No focal consolidation. No pleural effusion or pneumothorax. The heart is normal in size.  Postsurgical changes related to prior CABG. Median sternotomy. IMPRESSION: No evidence of acute cardiopulmonary disease. Electronically Signed   By: Charline Bills M.D.   On: 09/30/2015 07:14   Ct Head Wo Contrast  09/30/2015  CLINICAL DATA:  Fall this morning. EXAM: CT HEAD WITHOUT CONTRAST CT CERVICAL SPINE WITHOUT CONTRAST TECHNIQUE: Multidetector CT imaging of the head and cervical spine was performed following the standard protocol without intravenous contrast. Multiplanar CT image reconstructions of the cervical spine were also generated. COMPARISON:  None. FINDINGS: CT HEAD FINDINGS The ventricles are normal in configuration. There is ventricular and sulcal enlargement reflecting mild, diffuse atrophy. There are no parenchymal masses or mass effect. Patchy white matter hypoattenuation is noted consistent with moderate chronic microvascular ischemic change. There is no evidence of a recent cortical infarct. There are no extra-axial masses or abnormal fluid collections. There is no intracranial hemorrhage. The visualized sinuses and mastoid air cells are clear. No skull fracture. CT CERVICAL SPINE FINDINGS No fracture. Slight, grade 1, anterolisthesis of C4 on C5. There is straightening of the normal cervical lordosis. Mild loss of disc height at C3-C4. Moderate loss disc height at C4-C5, C5-C6 and C6-C7. There is facet degenerative change most evident on the right at C3-C4 and C4-C5. The bones are diffusely demineralized. Soft tissues show vascular calcifications but are otherwise unremarkable. Lung apices demonstrate emphysema and scarring. IMPRESSION: HEAD CT:  No acute intracranial abnormalities.  No skull fracture. CERVICAL CT:  No fracture or acute finding. Electronically Signed   By: Amie Portland M.D.   On: 09/30/2015 07:11   Ct Cervical Spine Wo Contrast  09/30/2015  CLINICAL DATA:  Fall this morning. EXAM: CT HEAD WITHOUT CONTRAST CT CERVICAL SPINE WITHOUT CONTRAST TECHNIQUE: Multidetector CT  imaging of the head and cervical spine was performed following the standard protocol without intravenous contrast. Multiplanar CT image reconstructions of the cervical spine were also generated. COMPARISON:  None. FINDINGS: CT HEAD FINDINGS The ventricles are normal in configuration. There is ventricular and sulcal enlargement reflecting mild, diffuse atrophy. There are no parenchymal masses or mass effect. Patchy white matter hypoattenuation is noted consistent with moderate chronic microvascular ischemic change. There is no evidence of a recent cortical infarct. There are no extra-axial masses or abnormal fluid collections. There is no intracranial hemorrhage. The visualized sinuses and mastoid air cells are clear. No skull fracture. CT CERVICAL SPINE FINDINGS No fracture. Slight, grade 1, anterolisthesis of C4 on C5. There is straightening of the normal cervical lordosis. Mild loss of disc height at C3-C4. Moderate loss disc height at C4-C5, C5-C6 and C6-C7. There is facet degenerative change most evident on the right at C3-C4 and C4-C5. The bones are diffusely demineralized. Soft tissues show vascular calcifications but are otherwise unremarkable. Lung apices demonstrate emphysema and scarring. IMPRESSION: HEAD CT:  No acute intracranial abnormalities.  No skull fracture. CERVICAL CT:  No fracture or acute finding. Electronically Signed   By: Amie Portland M.D.   On: 09/30/2015 07:11   Pelvis Portable  10/01/2015  CLINICAL DATA:  Postop day 0 ORIF intertrochanteric left femoral neck fracture. EXAM:  PORTABLE PELVIS 1-2 VIEWS COMPARISON:  Intraoperative left hip x-rays earlier today 8:49 a.m. and left hip x-rays yesterday. FINDINGS: Portable AP views of the pelvis and left femur were obtained. ORIF of the intertrochanteric left femoral neck fracture with intramedullary nail and compression screw. Near anatomic alignment. No acute complicating features. IMPRESSION: Near anatomic alignment post ORIF of the  intertrochanteric left femoral neck fracture. No acute complicating features. Electronically Signed   By: Hulan Saashomas  Lawrence M.D.   On: 10/01/2015 11:58   Dg Hip Operative Unilat W Or W/o Pelvis Left  10/01/2015  CLINICAL DATA:  Status post hip repair. FLUOROSCOPY TIME:  45 seconds. Images: 3. EXAM: OPERATIVE left HIP (WITH PELVIS IF PERFORMED) 3 VIEWS TECHNIQUE: Fluoroscopic spot image(s) were submitted for interpretation post-operatively. COMPARISON:  Sep 30, 2015 FINDINGS: A gamma nail and rod have been placed across the left hip fracture. Hardware is in good position. IMPRESSION: Appropriate placement of hardware across the left hip fracture. Electronically Signed   By: Gerome Samavid  Normoyle III M.D   On: 10/01/2015 11:12   Dg Hip Unilat W Or W/o Pelvis 2-3 Views Left  09/30/2015  CLINICAL DATA:  Fall, left hip deformity EXAM: DG HIP (WITH OR WITHOUT PELVIS) 2-3V LEFT COMPARISON:  None. FINDINGS: Intertrochanteric left hip fracture. Varus angulation with foreshortening. Bilateral hip joint spaces are preserved. Bony pelvis appears intact. Vascular calcifications. Left iliac stent. Additional vascular stent in the right medial thigh. IMPRESSION: Intertrochanteric left hip fracture, as above. Electronically Signed   By: Charline BillsSriyesh  Krishnan M.D.   On: 09/30/2015 07:16    Assessment/Plan 1. Falling, sequela Found sitting on the floor by facility staff no injury sustained. Pleasantly confused thinks she has dementia. Will obtain urine specimen for U/A and C/S   2. S/p left hip fracture Complains of left hip pain currently on Tylenol PRN. Initiate Tylenol 1000 mg Tablet one by mouth twice daily. Continue with PT/OT     Family/ staff Communication: Reviewed plan of care with patient and facility Nurse Supervisor   Labs/tests ordered:  urine specimen for U/A and C/S

## 2015-10-19 LAB — BASIC METABOLIC PANEL
BUN: 16 mg/dL (ref 4–21)
Creatinine: 3.9 mg/dL — AB (ref 0.5–1.1)
Glucose: 81 mg/dL
Potassium: 3.4 mmol/L (ref 3.4–5.3)
SODIUM: 142 mmol/L (ref 137–147)

## 2015-10-19 LAB — CBC AND DIFFERENTIAL
HEMATOCRIT: 25 % — AB (ref 36–46)
Hemoglobin: 7.9 g/dL — AB (ref 12.0–16.0)
PLATELETS: 179 10*3/uL (ref 150–399)
WBC: 4.8 10^3/mL

## 2015-10-19 LAB — HEPATIC FUNCTION PANEL
ALK PHOS: 126 U/L — AB (ref 25–125)
ALT: 12 U/L (ref 7–35)
AST: 26 U/L (ref 13–35)
BILIRUBIN, TOTAL: 0.6 mg/dL

## 2015-10-19 LAB — TSH: TSH: 10.12 u[IU]/mL — AB (ref 0.41–5.90)

## 2015-10-20 ENCOUNTER — Encounter: Payer: Self-pay | Admitting: Family

## 2015-10-20 ENCOUNTER — Non-Acute Institutional Stay (SKILLED_NURSING_FACILITY): Payer: Medicare Other | Admitting: Family

## 2015-10-20 DIAGNOSIS — E039 Hypothyroidism, unspecified: Secondary | ICD-10-CM

## 2015-10-20 DIAGNOSIS — E46 Unspecified protein-calorie malnutrition: Secondary | ICD-10-CM | POA: Diagnosis not present

## 2015-10-20 DIAGNOSIS — D509 Iron deficiency anemia, unspecified: Secondary | ICD-10-CM | POA: Diagnosis not present

## 2015-10-20 NOTE — Progress Notes (Signed)
Location:  [facility] Room Number: 705 Place of Service:  SNF [phone]) Provider:  Richarda Blade, NP  Oneal Grout, MD  Patient Care Team: Oneal Grout, MD as PCP - General (Internal Medicine) Elvis Coil, MD (Nephrology) Marykay Lex, MD as Resident (Cardiology) Terrial Rhodes, MD (Nephrology)  Extended Emergency Contact Information Primary Emergency Contact: [facility] Address: [address]          [place], [place] [address] [place] Home Phone: [phone] Mobile Phone: [phone] Relation: Daughter Secondary Emergency Contact: [facility] Address: [address]          [place], [place] [address] [place] Home Phone: [phone] Relation: Niece  Code Status:  DNR Goals of care: Advanced Directive information Advanced Directives 10/20/2015  Does patient have an advance directive? Yes  Type of Advance Directive Out of facility DNR (pink MOST or yellow form)  Does patient want to make changes to advanced directive? No - Patient declined  Copy of advanced directive(s) in chart? Yes     Chief Complaint  Patient presents with  . Acute Visit    Follow up on Abnormal Labs    HPI:  Pt is a 82 y.o. female seen today at [facility]  for an acute visit for abnormal lab results. She has a significant medical history of HTN, CAD,PVD, ESRD on dialysis among others.She is seen in her room today. Her recent lab results showed Hgb 7.9, HCT 24.9, TSH 10.125, TP 5.2, Alb 2.80, CR 3.89 ( 10/19/2015). She denies any acute issues this visit. She continues to exercise with PT/OT post left hip replacement.Facility staff reports unable to obtain previous urine specimen ordered due to decreased urine output patient on dialysis. Will discontinue urine specimen order.    Past Medical History  Diagnosis Date  . Myocardial infarction (HCC) 12-03-1995  . CAD in native artery 1997    CABG 1997, Last stress test  10/09 no ischemia  . S/P CABG x 5 1997    LIMA-LAD, SVG-OM, Seq SVG-RPDA-RPL  . Ischemic cardiomyopathy     EF by echo 11/2010 EF 35-45%, LA is moderately dilated, mod. MR, mod. to severe TR, RV systolic pressure 40-50 mmHg  . Moderate mitral regurgitation 11/2010  . Stroke, lacunar (HCC) 1994  . Carotid disease, bilateral (HCC)     Most recent Dopplers April 2014: Right carotid 50-69% (lower range);  lleft less than 50%.  . Hypertension, renal disease   . Dyslipidemia   . 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 Peripheral vascular disease (HCC) 03/2008    Stents to rt SFA, Dopplers from April 2014: No significant change, last PV cath 03/29/08  -- fairly widely patent R. SFA stent. Left SFA less than 50%. Proximal but occluded in the mid vessel with reconstitution distally. Right PT and AP occluded as described previously an angiography. Left AP occluded also noted angiographically   . Anemia due to chronic renal failure treated with erythropoietin   . Vulvar lesion   . Osteoarthritis   . Hx of sickle cell trait   . H/O pulmonary fibrosis   . Shortness of breath     RELATED TO DIALYSIS -TOO  MUCH FLUID  . Alzheimer's dementia   . Hypertension    Past Surgical History  Procedure Laterality Date  . Cholecystectomy    . Coronary artery bypass graft  1997    LIMA-LAD; seq VG-PDA&PLA; VG-OM  . Vulvar lesion removal  04/23/2011    Procedure: VULVAR LESION;  Surgeon: Laurette SchimkeWendy Brewster, MD;  Location: [facility] ORS;  Service: Gynecology;  Laterality: N/A;  Wide Local Excision Of Vulvar Lesion   . Femoral artery stent Right 03/2008    6 mm X 150 mm + 6 mm x 100 mm SMART stent;;   . Moles       from thigh s removed.  Marland Kitchen. Av fistula placement Right 2001    [facility]  .  Abdominal aortagram N/A 07/18/2011    Procedure: ABDOMINAL Ronny FlurryAORTAGRAM;  Surgeon: Fransisco HertzBrian L Chen, MD;  [facility];  Service: Cardiovascular;  Laterality: N/A;  . Shuntogram N/A 09/05/2011    Procedure: Betsey AmenSHUNTOGRAM;  Surgeon: Fransisco HertzBrian L Chen, MD;  [facility];  Service: Cardiovascular;  Laterality: N/A;  . Ligation of arteriovenous  fistula Right 09/21/2015    Procedure: LIGATION OF RIGHT UPPER ARM ARTERIOVENOUS  FISTULA;  Surgeon: Pryor OchoaJames D Lawson, MD;  [facility];  Service: Vascular;  Laterality: Right;  . Intramedullary (im) nail intertrochanteric Left 10/01/2015    Procedure: INTRAMEDULLARY (IM) NAIL INTERTROCHANTRIC;  Surgeon: Sheral Apleyimothy D Murphy, MD;  Location: [facility] OR;  Service: Orthopedics;  Laterality: Left;    Allergies  Allergen Reactions  . Aspirin Itching  . Pletal [Cilostazol]     dizziness      Medication List       This list is accurate as of: 10/20/15 12:51 PM.  Always use your most recent med list.               acetaminophen 500 MG tablet  Commonly known as:  TYLENOL  Take 1,000 mg by mouth 2 (two) times daily.     acetaminophen 325 MG tablet  Commonly known as:  TYLENOL  Take 650 mg by mouth every 6 (six) hours as needed. For Fever >/= 101     aspirin EC 325 MG tablet  Take 325 mg by mouth daily. Stop Date 11/02/15     atorvastatin 80 MG tablet  Commonly known as:  LIPITOR  Take 1 tablet (80 mg total) by mouth daily.     calcium acetate 667 MG capsule  Commonly known as:  PHOSLO  Take 667 mg by mouth 3 (three) times daily with meals.     carvedilol 12.5 MG tablet  Commonly known as:  COREG  Take 1 tablet (12.5 mg total) by mouth 2 (two) times daily with a meal.     clopidogrel 75 MG tablet  Commonly known as:  PLAVIX  TAKE ONE TABLET BY MOUTH ONCE DAILY     DIALYVITE 800 PO  Take 1 capsule by mouth daily.     DRY EYES OP  Place 1 drop into both eyes daily as needed (for dry eyes).     feeding supplement (NEPRO CARB STEADY) Liqd  Take  237 mLs by mouth 2 (two) times daily between meals.     latanoprost 0.005 % ophthalmic solution  Commonly known as:  XALATAN  Place 1 drop into both eyes at bedtime. Wait 3 - 5 minutes between 2 eye meds     senna-docusate 8.6-50 MG tablet  Commonly known as:  Senokot-S  Take 1 tablet by mouth at bedtime as needed  for mild constipation.        Review of Systems  Constitutional: Negative for fever, chills, activity change, appetite change and fatigue.  Respiratory: Negative for cough, chest tightness, shortness of breath and wheezing.   Cardiovascular: Negative for chest pain, palpitations and leg swelling.  Gastrointestinal: Negative for nausea, vomiting, abdominal pain, diarrhea, constipation and abdominal distention.  Genitourinary: Negative for dysuria, urgency, frequency and flank pain.  Musculoskeletal: Positive for gait problem.  Skin: Negative.        Left hip surgical incision.    Neurological: Negative for dizziness, seizures, light-headedness and headaches.  Psychiatric/Behavioral: Negative for hallucinations, confusion, sleep disturbance and agitation.     There is no immunization history on file for this patient. Pertinent  Health Maintenance Due  Topic Date Due  . DEXA SCAN  05/22/1997  . PNA vac Low Risk Adult (1 of 2 - PCV13) 10/10/2016 (Originally 05/22/1997)  . INFLUENZA VACCINE  12/12/2015   No flowsheet data found. Functional Status Survey:    Filed Vitals:   10/20/15 1225  BP: 118/78  Pulse: 80  Temp: 97.9 F (36.6 C)  Resp: 20  Height:  (1.575 m)  Weight: 84 lb (38.102 kg)  SpO2: 96%   Body mass index is 15.36 kg/(m^2). Physical Exam  Constitutional:  Thin Frail pleasant Elderly in no acute distress.   HENT:  Head: Normocephalic.  Mouth/Throat: Oropharynx is clear and moist.  Eyes: Conjunctivae and EOM are normal. Pupils are equal, round, and reactive to light. Right eye exhibits no discharge. Left eye exhibits no discharge. No scleral  icterus.  Neck: Normal range of motion. No JVD present. No thyromegaly present.  Cardiovascular: Normal rate, regular rhythm, normal heart sounds and intact distal pulses.  Exam reveals no gallop and no friction rub.   No murmur heard. Pulmonary/Chest: Effort normal and breath sounds normal. No respiratory distress. She has no wheezes. She has no rales.  Abdominal: Soft. Bowel sounds are normal. She exhibits no distension. There is no tenderness. There is no rebound and no guarding.  Musculoskeletal: She exhibits no edema or tenderness.  Normal ROM except left hip limited due to pain.   Lymphadenopathy:    She has no cervical adenopathy.  Neurological: She is alert.  Skin: Skin is warm and dry. No rash noted. No erythema. No pallor.  Left hip surgical incision healed and well approximated.   Psychiatric: She has a normal mood and affect.    Labs reviewed:  Recent Labs  09/30/15 0520 10/01/15 0515 10/02/15 10/02/15 0342 10/19/15  NA 139 137 137 137 142  K 3.2* 4.4  --  4.3 3.4  CL 98* 96*  --  99*  --   CO2 30 28  --  23  --   GLUCOSE 134* 125*  --  90  --   BUN 20 37* 48* 48* 16  CREATININE 5.05* 7.06* 8.5* 8.48* 3.9*  CALCIUM 9.1 9.3  --  8.5*  --   MG  --   --   --  2.0  --   PHOS  --   --   --  7.6*  --     Recent Labs  10/02/15 0342 10/19/15  AST  --  26  ALT  --  12  ALKPHOS  --  126*  ALBUMIN 2.6*  --     Recent Labs  09/30/15 0520 10/01/15 0515 10/02/15 0342 10/03/15 10/03/15 0704 10/19/15  WBC 7.5 7.3 7.2 6.2 6.2 4.8  NEUTROABS 6.3  --  5.7  --  4.7  --   HGB 11.1* 11.5* 9.2*  --  8.7* 7.9*  HCT 34.8* 35.6* 28.5*  --  26.1* 25*  MCV 84.7 84.0 83.3  --  81.8  --   PLT 154 177 143*  --  136* 179   Lab Results  Component Value Date   TSH 10.12* 10/19/2015   No results found for: HGBA1C No results found for: CHOL, HDL, LDLCALC, LDLDIRECT, TRIG, CHOLHDL  Significant Diagnostic Results in last 30 days:  Dg Chest 1 View  09/30/2015  CLINICAL DATA:   Fall, left hip deformity EXAM: CHEST 1 VIEW COMPARISON:  04/18/2011 FINDINGS: Chronic interstitial markings/emphysematous changes with scarring in the right upper lobe. No focal consolidation. No pleural effusion or pneumothorax. The heart is normal in size. Postsurgical changes related to prior CABG. Median sternotomy. IMPRESSION: No evidence of acute cardiopulmonary disease. Electronically Signed   By: Charline Bills M.D.   On: 09/30/2015 07:14   Ct Head Wo Contrast  09/30/2015  CLINICAL DATA:  Fall this morning. EXAM: CT HEAD WITHOUT CONTRAST CT CERVICAL SPINE WITHOUT CONTRAST TECHNIQUE: Multidetector CT imaging of the head and cervical spine was performed following the standard protocol without intravenous contrast. Multiplanar CT image reconstructions of the cervical spine were also generated. COMPARISON:  None. FINDINGS: CT HEAD FINDINGS The ventricles are normal in configuration. There is ventricular and sulcal enlargement reflecting mild, diffuse atrophy. There are no parenchymal masses or mass effect. Patchy white matter hypoattenuation is noted consistent with moderate chronic microvascular ischemic change. There is no evidence of a recent cortical infarct. There are no extra-axial masses or abnormal fluid collections. There is no intracranial hemorrhage. The visualized sinuses and mastoid air cells are clear. No skull fracture. CT CERVICAL SPINE FINDINGS No fracture. Slight, grade 1, anterolisthesis of C4 on C5. There is straightening of the normal cervical lordosis. Mild loss of disc height at C3-C4. Moderate loss disc height at C4-C5, C5-C6 and C6-C7. There is facet degenerative change most evident on the right at C3-C4 and C4-C5. The bones are diffusely demineralized. Soft tissues show vascular calcifications but are otherwise unremarkable. Lung apices demonstrate emphysema and scarring. IMPRESSION: HEAD CT:  No acute intracranial abnormalities.  No skull fracture. CERVICAL CT:  No fracture or  acute finding. Electronically Signed   By: Amie Portland M.D.   On: 09/30/2015 07:11   Ct Cervical Spine Wo Contrast  09/30/2015  CLINICAL DATA:  Fall this morning. EXAM: CT HEAD WITHOUT CONTRAST CT CERVICAL SPINE WITHOUT CONTRAST TECHNIQUE: Multidetector CT imaging of the head and cervical spine was performed following the standard protocol without intravenous contrast. Multiplanar CT image reconstructions of the cervical spine were also generated. COMPARISON:  None. FINDINGS: CT HEAD FINDINGS The ventricles are normal in configuration. There is ventricular and sulcal enlargement reflecting mild, diffuse atrophy. There are no parenchymal masses or mass effect. Patchy white matter hypoattenuation is noted consistent with moderate chronic microvascular ischemic change. There is no evidence of a recent cortical infarct. There are no extra-axial masses or abnormal fluid collections. There is no intracranial hemorrhage. The visualized sinuses and mastoid air cells are clear. No skull fracture. CT CERVICAL SPINE FINDINGS No fracture. Slight, grade 1, anterolisthesis of C4 on C5. There is straightening of the normal cervical lordosis. Mild loss of disc height at C3-C4. Moderate loss disc height at C4-C5, C5-C6 and C6-C7. There is facet degenerative change most evident on the right at C3-C4 and C4-C5. The bones are diffusely demineralized. Soft  tissues show vascular calcifications but are otherwise unremarkable. Lung apices demonstrate emphysema and scarring. IMPRESSION: HEAD CT:  No acute intracranial abnormalities.  No skull fracture. CERVICAL CT:  No fracture or acute finding. Electronically Signed   By: Amie Portland M.D.   On: 09/30/2015 07:11   Pelvis Portable  10/01/2015  CLINICAL DATA:  Postop day 0 ORIF intertrochanteric left femoral neck fracture. EXAM: PORTABLE PELVIS 1-2 VIEWS COMPARISON:  Intraoperative left hip x-rays earlier today 8:49 a.m. and left hip x-rays yesterday. FINDINGS: Portable AP views of  the pelvis and left femur were obtained. ORIF of the intertrochanteric left femoral neck fracture with intramedullary nail and compression screw. Near anatomic alignment. No acute complicating features. IMPRESSION: Near anatomic alignment post ORIF of the intertrochanteric left femoral neck fracture. No acute complicating features. Electronically Signed   By: Hulan Saas M.D.   On: 10/01/2015 11:58   Dg Hip Operative Unilat W Or W/o Pelvis Left  10/01/2015  CLINICAL DATA:  Status post hip repair. FLUOROSCOPY TIME:  45 seconds. Images: 3. EXAM: OPERATIVE left HIP (WITH PELVIS IF PERFORMED) 3 VIEWS TECHNIQUE: Fluoroscopic spot image(s) were submitted for interpretation post-operatively. COMPARISON:  Sep 30, 2015 FINDINGS: A gamma nail and rod have been placed across the left hip fracture. Hardware is in good position. IMPRESSION: Appropriate placement of hardware across the left hip fracture. Electronically Signed   By: Gerome Sam III M.D   On: 10/01/2015 11:12   Dg Hip Unilat W Or W/o Pelvis 2-3 Views Left  09/30/2015  CLINICAL DATA:  Fall, left hip deformity EXAM: DG HIP (WITH OR WITHOUT PELVIS) 2-3V LEFT COMPARISON:  None. FINDINGS: Intertrochanteric left hip fracture. Varus angulation with foreshortening. Bilateral hip joint spaces are preserved. Bony pelvis appears intact. Vascular calcifications. Left iliac stent. Additional vascular stent in the right medial thigh. IMPRESSION: Intertrochanteric left hip fracture, as above. Electronically Signed   By: Charline Bills M.D.   On: 09/30/2015 07:16    Assessment/Plan Hypothyroidism  Recent TSH level TSH 10.125 ( 10/19/2015). Start levothyroxine 12.5 mcg Tablet daily on empty stomach. Recheck TSH level in 8 weeks.   Anemia  S/p left hip arthroplasty. Recent lab results Hgb 7.9, HCT 24.9 ( 10/19/2015) previous Hgb 8.7 (10/03/2015). Start Ferrous sulfate 325 mg Tablet one by mouth once daily. Continue Senna-Docusate 8.6-50 mg tablet. Encourage oral  intake. Monitor CBC  Protein Malnutrition   TP 5.2, Alb 2.80 ( 10/19/2015). Appetite has improved post -op. RD consult . Monitor BMP     Family/ staff Communication:Reviewed plan of care with patient and facility Nurse supervisor Labs/tests ordered:  TSH level in 8 weeks.

## 2015-10-24 ENCOUNTER — Non-Acute Institutional Stay (SKILLED_NURSING_FACILITY): Payer: Medicare Other | Admitting: Family

## 2015-10-24 DIAGNOSIS — I2510 Atherosclerotic heart disease of native coronary artery without angina pectoris: Secondary | ICD-10-CM

## 2015-10-24 DIAGNOSIS — Z8781 Personal history of (healed) traumatic fracture: Secondary | ICD-10-CM

## 2015-10-24 DIAGNOSIS — I120 Hypertensive chronic kidney disease with stage 5 chronic kidney disease or end stage renal disease: Secondary | ICD-10-CM

## 2015-10-24 DIAGNOSIS — N186 End stage renal disease: Secondary | ICD-10-CM | POA: Diagnosis not present

## 2015-10-24 DIAGNOSIS — R269 Unspecified abnormalities of gait and mobility: Secondary | ICD-10-CM

## 2015-10-24 DIAGNOSIS — Z992 Dependence on renal dialysis: Secondary | ICD-10-CM | POA: Diagnosis not present

## 2015-10-24 DIAGNOSIS — E785 Hyperlipidemia, unspecified: Secondary | ICD-10-CM

## 2015-10-24 NOTE — Progress Notes (Signed)
Patient ID: Courtney [name], female   DOB: 25-Jun-1930, 82 y.o.   MRN: [id number]  Location:   [facility]   Place of Service:  SNF (31)  Provider: Richarda Blade FNP-C   PCP: Oneal Grout, MD Patient Care Team: Oneal Grout, MD as PCP - General (Internal Medicine) Elvis Coil, MD (Nephrology) Marykay Lex, MD as Resident (Cardiology) Terrial Rhodes, MD (Nephrology)  Extended Emergency Contact Information Primary Emergency Contact: [facility] Address: [address]          [place], [place] [address] [place] Home Phone: [phone] Mobile Phone: [phone] Relation: Daughter Secondary Emergency Contact: [facility] Address: [address]          [place], [place] [address] [place] Home Phone: [phone] Relation: Niece  Code Status:DNR  Goals of care:  Advanced Directive information Advanced Directives 10/20/2015  Does patient have an advance directive? Yes  Type of Advance Directive Out of facility DNR (pink MOST or yellow form)  Does patient want to make changes to advanced directive? No - Patient declined  Copy of advanced directive(s) in chart? Yes     Allergies  Allergen Reactions  . Aspirin Itching  . Pletal [Cilostazol]     dizziness    Chief Complaint  Patient presents with  . Discharge Note    HPI:  82 y.o. female seen today at [facility] for discharge home. She was here for short term rehabilitation post hospital admission from 09/30/15-10/03/15 post fall with left hip fracture. She underwent IM nailing on 10/01/15.  She has a medical history of HTN, CAD,PVD,MI, OA Stroke, ESRD on HD. She is seen in her room today. She denies any acute issues this visit. She has worked well with PT/OT now stable for discharge home.She will be discharged home with Home health PT/OT to continue with ROM, Exercise, Gait stability and muscle strengthening. She will require DME : 16 X 16 standard WC with cushion, Extended  brake arms, anti tippers and elevating leg rests to allow her to maintain current level of independence with ADL's. Home health services will be arranged by facility social worker prior to discharge home. A one month prescription will be write for medication.     Past Medical History  Diagnosis Date  . Myocardial infarction (HCC) 12-03-1995  . CAD in native artery 1997    CABG 1997, Last stress test 10/09 no ischemia  . S/P CABG x 5 1997    LIMA-LAD, SVG-OM, Seq SVG-RPDA-RPL  . Ischemic cardiomyopathy     EF by echo 11/2010 EF 35-45%, LA is moderately dilated, mod. MR, mod. to severe TR, RV systolic pressure 40-50 mmHg  . Moderate mitral regurgitation 11/2010  . Stroke, lacunar (HCC) 1994  . Carotid disease, bilateral (HCC)     Most recent Dopplers April 2014: Right carotid 50-69% (lower range);  lleft less than 50%.  . Hypertension, renal disease   . Dyslipidemia   . 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 Peripheral vascular disease (HCC) 03/2008    Stents to rt SFA, Dopplers from April 2014: No significant change, last PV cath 03/29/08  -- fairly widely patent R. SFA stent. Left SFA less than 50%. Proximal  but occluded in the mid vessel with reconstitution distally. Right PT and AP occluded as described previously an angiography. Left AP occluded also noted angiographically   . Anemia due to chronic renal failure treated with erythropoietin   . Vulvar lesion   . Osteoarthritis   . Hx of sickle cell trait   . H/O pulmonary fibrosis   . Shortness of breath     RELATED TO DIALYSIS -TOO MUCH FLUID  . Alzheimer's dementia   . Hypertension     Past Surgical History  Procedure Laterality Date  . Cholecystectomy    . Coronary artery bypass graft   1997    LIMA-LAD; seq VG-PDA&PLA; VG-OM  . Vulvar lesion removal  04/23/2011    Procedure: VULVAR LESION;  Surgeon: Laurette Schimke, MD;  Location: [facility] ORS;  Service: Gynecology;  Laterality: N/A;  Wide Local Excision Of Vulvar Lesion   . Femoral artery stent Right 03/2008    6 mm X 150 mm + 6 mm x 100 mm SMART stent;;   . Moles       from thigh s removed.  Marland Kitchen Av fistula placement Right 2001    AVF  . Abdominal aortagram N/A 07/18/2011    Procedure: ABDOMINAL Ronny Flurry;  Surgeon: Fransisco Hertz, MD;  Location: [facility] CATH LAB;  Service: Cardiovascular;  Laterality: N/A;  . Shuntogram N/A 09/05/2011    Procedure: Betsey Amen;  Surgeon: Fransisco Hertz, MD;  Location: [facility] CATH LAB;  Service: Cardiovascular;  Laterality: N/A;  . Ligation of arteriovenous  fistula Right 09/21/2015    Procedure: LIGATION OF RIGHT UPPER ARM ARTERIOVENOUS  FISTULA;  Surgeon: Pryor Ochoa, MD;  Location: [facility] OR;  Service: Vascular;  Laterality: Right;  . Intramedullary (im) nail intertrochanteric Left 10/01/2015    Procedure: INTRAMEDULLARY (IM) NAIL INTERTROCHANTRIC;  Surgeon: Sheral Apley, MD;  Location: [facility] OR;  Service: Orthopedics;  Laterality: Left;      reports that she quit smoking about 23 years ago. Her smoking use included Cigarettes. She has never used smokeless tobacco. She reports that she does not drink alcohol or use illicit drugs. Social History   Social History  . Marital Status: Single    Spouse Name: N/A  . Number of Children: 1  . Years of Education: N/A   Occupational History  . Retired-dietary     from [employer]   Social History Main Topics  . Smoking status: Former Smoker    Types: Cigarettes    Quit date: 11/01/1991  . Smokeless tobacco: Never Used  . Alcohol Use: No  . Drug Use: No  . Sexual Activity: No   Other Topics Concern  . Not on file   Social History Narrative   Single mother of one, grandmother of one. She is not exercising as much as he used to. She used to dance and a  lot of walking. But of late she has not been as much. She states that when she turned 69 she became lazy.   She does not smoke and does not drink.   Again we discussed her goals of care and desires as far as resuscitation go. She continues to state that she wants to be DO NOT RESUSCITATE DO NOT INTUBATE, but does not have the form to state that. She also is not interested in invasive evaluation unless directed by symptoms. With that in mind, she is not in favor of continued noninvasive evaluation.   Functional Status Survey:    Allergies  Allergen Reactions  .  Aspirin Itching  . Pletal [Cilostazol]     dizziness    Pertinent  Health Maintenance Due  Topic Date Due  . DEXA SCAN  05/22/1997  . PNA vac Low Risk Adult (1 of 2 - PCV13) 10/10/2016 (Originally 05/22/1997)  . INFLUENZA VACCINE  12/12/2015    Medications:   Medication List       This list is accurate as of: 10/24/15  6:30 PM.  Always use your most recent med list.               acetaminophen 500 MG tablet  Commonly known as:  TYLENOL  Take 1,000 mg by mouth 2 (two) times daily.     acetaminophen 325 MG tablet  Commonly known as:  TYLENOL  Take 650 mg by mouth every 6 (six) hours as needed. Reported on 10/24/2015     aspirin EC 325 MG tablet  Take 325 mg by mouth daily. Stop Date 11/02/15     atorvastatin 80 MG tablet  Commonly known as:  LIPITOR  Take 1 tablet (80 mg total) by mouth daily.     calcium acetate 667 MG capsule  Commonly known as:  PHOSLO  Take 667 mg by mouth 3 (three) times daily with meals.     carvedilol 12.5 MG tablet  Commonly known as:  COREG  Take 1 tablet (12.5 mg total) by mouth 2 (two) times daily with a meal.     clopidogrel 75 MG tablet  Commonly known as:  PLAVIX  TAKE ONE TABLET BY MOUTH ONCE DAILY     DIALYVITE 800 PO  Take 1 capsule by mouth daily.     DRY EYES OP  Place 1 drop into both eyes daily as needed (for dry eyes).     feeding supplement (NEPRO CARB STEADY)  Liqd  Take 237 mLs by mouth 2 (two) times daily between meals.     ferrous sulfate 325 (65 FE) MG tablet  Take 325 mg by mouth daily with breakfast.     latanoprost 0.005 % ophthalmic solution  Commonly known as:  XALATAN  Place 1 drop into both eyes at bedtime. Wait 3 - 5 minutes between 2 eye meds     levothyroxine 25 MCG tablet  Commonly known as:  SYNTHROID, LEVOTHROID  Take 12.5 mcg by mouth daily before breakfast.     senna-docusate 8.6-50 MG tablet  Commonly known as:  Senokot-S  Take 1 tablet by mouth at bedtime as needed for mild constipation.        Review of Systems  Constitutional: Negative for fever, chills, activity change, appetite change and fatigue.  HENT: Negative for congestion, hearing loss, rhinorrhea, sinus pressure and sore throat.   Eyes: Negative.   Respiratory: Negative for cough, chest tightness, shortness of breath and wheezing.   Cardiovascular: Negative for chest pain, palpitations and leg swelling.  Gastrointestinal: Negative for nausea, vomiting, abdominal pain, diarrhea, constipation and abdominal distention.  Endocrine: Negative.   Genitourinary: Negative for dysuria, urgency, frequency and flank pain.  Musculoskeletal: Positive for gait problem.  Skin: Negative.        Left hip surgical incision.    Neurological: Negative for dizziness, seizures, light-headedness and headaches.  Hematological: Does not bruise/bleed easily.  Psychiatric/Behavioral: Negative for hallucinations, confusion, sleep disturbance and agitation.    Filed Vitals:   10/24/15 1200  BP: 119/75  Pulse: 59  Temp: 98.6 F (37 C)  Resp: 20  Height: 5\' 2"  (1.575 m)  Weight: 84 lb 9.6  oz (38.374 kg)  SpO2: 98%   Body mass index is 15.47 kg/(m^2). Physical Exam  Constitutional:  Thin Frail pleasant Elderly in no acute distress.   HENT:  Head: Normocephalic.  Mouth/Throat: Oropharynx is clear and moist.  Eyes: Conjunctivae and EOM are normal. Pupils are equal,  round, and reactive to light. Right eye exhibits no discharge. Left eye exhibits no discharge. No scleral icterus.  Neck: Normal range of motion. No JVD present. No thyromegaly present.  Cardiovascular: Normal rate, regular rhythm, normal heart sounds and intact distal pulses.  Exam reveals no gallop and no friction rub.   No murmur heard. Pulmonary/Chest: Effort normal and breath sounds normal. No respiratory distress. She has no wheezes. She has no rales.  Abdominal: Soft. Bowel sounds are normal. She exhibits no distension. There is no tenderness. There is no rebound and no guarding.  Musculoskeletal: She exhibits no edema or tenderness.  Normal ROM except left hip limited due to pain.   Lymphadenopathy:    She has no cervical adenopathy.  Neurological: She is alert.  Skin: Skin is warm and dry. No rash noted. No erythema. No pallor.  Left hip surgical incision healed and well approximated.   Psychiatric: She has a normal mood and affect.    Labs reviewed: Basic Metabolic Panel:  Recent Labs  16/02/9604/20/17 0520 10/01/15 0515 10/02/15 10/02/15 0342 10/19/15  NA 139 137 137 137 142  K 3.2* 4.4  --  4.3 3.4  CL 98* 96*  --  99*  --   CO2 30 28  --  23  --   GLUCOSE 134* 125*  --  90  --   BUN 20 37* 48* 48* 16  CREATININE 5.05* 7.06* 8.5* 8.48* 3.9*  CALCIUM 9.1 9.3  --  8.5*  --   MG  --   --   --  2.0  --   PHOS  --   --   --  7.6*  --    Liver Function Tests:  Recent Labs  10/02/15 0342 10/19/15  AST  --  26  ALT  --  12  ALKPHOS  --  126*  ALBUMIN 2.6*  --    No results for input(s): LIPASE, AMYLASE in the last 8760 hours. No results for input(s): AMMONIA in the last 8760 hours. CBC:  Recent Labs  09/30/15 0520 10/01/15 0515 10/02/15 0342 10/03/15 10/03/15 0704 10/19/15  WBC 7.5 7.3 7.2 6.2 6.2 4.8  NEUTROABS 6.3  --  5.7  --  4.7  --   HGB 11.1* 11.5* 9.2*  --  8.7* 7.9*  HCT 34.8* 35.6* 28.5*  --  26.1* 25*  MCV 84.7 84.0 83.3  --  81.8  --   PLT 154 177  143*  --  136* 179   Cardiac Enzymes: No results for input(s): CKTOTAL, CKMB, CKMBINDEX, TROPONINI in the last 8760 hours. BNP: Invalid input(s): POCBNP CBG:  Recent Labs  09/21/15 1100 09/21/15 1129 09/21/15 1139  GLUCAP 65 54* 76    Procedures and Imaging Studies During Stay: Dg Chest 1 View  09/30/2015  CLINICAL DATA:  Fall, left hip deformity EXAM: CHEST 1 VIEW COMPARISON:  04/18/2011 FINDINGS: Chronic interstitial markings/emphysematous changes with scarring in the right upper lobe. No focal consolidation. No pleural effusion or pneumothorax. The heart is normal in size. Postsurgical changes related to prior CABG. Median sternotomy. IMPRESSION: No evidence of acute cardiopulmonary disease. Electronically Signed   By: Charline BillsSriyesh  Krishnan M.D.   On: 09/30/2015 07:14  Ct Head Wo Contrast  09/30/2015  CLINICAL DATA:  Fall this morning. EXAM: CT HEAD WITHOUT CONTRAST CT CERVICAL SPINE WITHOUT CONTRAST TECHNIQUE: Multidetector CT imaging of the head and cervical spine was performed following the standard protocol without intravenous contrast. Multiplanar CT image reconstructions of the cervical spine were also generated. COMPARISON:  None. FINDINGS: CT HEAD FINDINGS The ventricles are normal in configuration. There is ventricular and sulcal enlargement reflecting mild, diffuse atrophy. There are no parenchymal masses or mass effect. Patchy white matter hypoattenuation is noted consistent with moderate chronic microvascular ischemic change. There is no evidence of a recent cortical infarct. There are no extra-axial masses or abnormal fluid collections. There is no intracranial hemorrhage. The visualized sinuses and mastoid air cells are clear. No skull fracture. CT CERVICAL SPINE FINDINGS No fracture. Slight, grade 1, anterolisthesis of C4 on C5. There is straightening of the normal cervical lordosis. Mild loss of disc height at C3-C4. Moderate loss disc height at C4-C5, C5-C6 and C6-C7. There is  facet degenerative change most evident on the right at C3-C4 and C4-C5. The bones are diffusely demineralized. Soft tissues show vascular calcifications but are otherwise unremarkable. Lung apices demonstrate emphysema and scarring. IMPRESSION: HEAD CT:  No acute intracranial abnormalities.  No skull fracture. CERVICAL CT:  No fracture or acute finding. Electronically Signed   By: Amie Portland M.D.   On: 09/30/2015 07:11   Ct Cervical Spine Wo Contrast  09/30/2015  CLINICAL DATA:  Fall this morning. EXAM: CT HEAD WITHOUT CONTRAST CT CERVICAL SPINE WITHOUT CONTRAST TECHNIQUE: Multidetector CT imaging of the head and cervical spine was performed following the standard protocol without intravenous contrast. Multiplanar CT image reconstructions of the cervical spine were also generated. COMPARISON:  None. FINDINGS: CT HEAD FINDINGS The ventricles are normal in configuration. There is ventricular and sulcal enlargement reflecting mild, diffuse atrophy. There are no parenchymal masses or mass effect. Patchy white matter hypoattenuation is noted consistent with moderate chronic microvascular ischemic change. There is no evidence of a recent cortical infarct. There are no extra-axial masses or abnormal fluid collections. There is no intracranial hemorrhage. The visualized sinuses and mastoid air cells are clear. No skull fracture. CT CERVICAL SPINE FINDINGS No fracture. Slight, grade 1, anterolisthesis of C4 on C5. There is straightening of the normal cervical lordosis. Mild loss of disc height at C3-C4. Moderate loss disc height at C4-C5, C5-C6 and C6-C7. There is facet degenerative change most evident on the right at C3-C4 and C4-C5. The bones are diffusely demineralized. Soft tissues show vascular calcifications but are otherwise unremarkable. Lung apices demonstrate emphysema and scarring. IMPRESSION: HEAD CT:  No acute intracranial abnormalities.  No skull fracture. CERVICAL CT:  No fracture or acute finding.  Electronically Signed   By: Amie Portland M.D.   On: 09/30/2015 07:11   Pelvis Portable  10/01/2015  CLINICAL DATA:  Postop day 0 ORIF intertrochanteric left femoral neck fracture. EXAM: PORTABLE PELVIS 1-2 VIEWS COMPARISON:  Intraoperative left hip x-rays earlier today 8:49 a.m. and left hip x-rays yesterday. FINDINGS: Portable AP views of the pelvis and left femur were obtained. ORIF of the intertrochanteric left femoral neck fracture with intramedullary nail and compression screw. Near anatomic alignment. No acute complicating features. IMPRESSION: Near anatomic alignment post ORIF of the intertrochanteric left femoral neck fracture. No acute complicating features. Electronically Signed   By: Hulan Saas M.D.   On: 10/01/2015 11:58   Dg Hip Operative Unilat W Or W/o Pelvis Left  10/01/2015  CLINICAL DATA:  Status post hip repair. FLUOROSCOPY TIME:  45 seconds. Images: 3. EXAM: OPERATIVE left HIP (WITH PELVIS IF PERFORMED) 3 VIEWS TECHNIQUE: Fluoroscopic spot image(s) were submitted for interpretation post-operatively. COMPARISON:  Sep 30, 2015 FINDINGS: A gamma nail and rod have been placed across the left hip fracture. Hardware is in good position. IMPRESSION: Appropriate placement of hardware across the left hip fracture. Electronically Signed   By: Gerome Sam III M.D   On: 10/01/2015 11:12   Dg Hip Unilat W Or W/o Pelvis 2-3 Views Left  09/30/2015  CLINICAL DATA:  Fall, left hip deformity EXAM: DG HIP (WITH OR WITHOUT PELVIS) 2-3V LEFT COMPARISON:  None. FINDINGS: Intertrochanteric left hip fracture. Varus angulation with foreshortening. Bilateral hip joint spaces are preserved. Bony pelvis appears intact. Vascular calcifications. Left iliac stent. Additional vascular stent in the right medial thigh. IMPRESSION: Intertrochanteric left hip fracture, as above. Electronically Signed   By: Charline Bills M.D.   On: 09/30/2015 07:16    Assessment/Plan:   1. Coronary artery disease  involving native coronary artery of native heart without angina pectoris Chest pain free. Continue on plavix 75 mg Tablet   2. Hypertension, renal disease, stage 5 chronic kidney disease or end stage renal disease (HCC) B/p stable. Continue Coreg 12.5 mg Tablet . BMP in 1-2 weeks with PCP  3. ESRD on dialysis [facility]) Continue on dialysis   4. Dyslipidemia Continue on Lipitor 80 mg Tablet. Lipid panel with PCP.   5. S/p left hip fracture Status post short term rehabilitation post hospital admission from 09/30/15-10/03/15 post fall with left hip fracture. She underwent IM nailing on 10/01/15. She has worked well with PT/OT now stable for discharge home.She will be discharged home with Home health PT/OT to continue with ROM, Exercise, Gait stability and muscle strengthening. She will require DME : 16 X 16 standard WC with cushion, Extended brake arms, anti tippers and elevating leg rests.Continue current pain regimen.   6. Abnormality of gait Status post fall with left hip fracture. She underwent IM nailing on 10/01/15.Discharge home with Home health PT/OT to continue with ROM, Exercise, Gait stability and muscle strengthening.    Patient is being discharged with the following home health services:   PT/OT to continue with ROM, Exercise, Gait stability and muscle strengthening.  Patient is being discharged with the following durable medical equipment:   16 X 16 standard WC with cushion, Extended brake arms, anti tippers and elevating leg rests.  Rx written X 1 month supply.   Patient has been advised to f/u with their PCP in 1-2 weeks to bring them up to date on their rehab stay.  Social services at facility was responsible for arranging this appointment.  Pt was provided with a 30 day supply of prescriptions for medications and refills must be obtained from their PCP.  For controlled substances, a more limited supply may be provided adequate until PCP appointment only.  Future labs/tests  needed:  CBC, BMP in 1-2 weeks with PCP

## 2015-10-30 DIAGNOSIS — I69354 Hemiplegia and hemiparesis following cerebral infarction affecting left non-dominant side: Secondary | ICD-10-CM | POA: Diagnosis not present

## 2015-10-30 DIAGNOSIS — F0280 Dementia in other diseases classified elsewhere without behavioral disturbance: Secondary | ICD-10-CM

## 2015-10-30 DIAGNOSIS — G309 Alzheimer's disease, unspecified: Secondary | ICD-10-CM | POA: Diagnosis not present

## 2015-10-30 DIAGNOSIS — S72142D Displaced intertrochanteric fracture of left femur, subsequent encounter for closed fracture with routine healing: Secondary | ICD-10-CM

## 2015-11-24 ENCOUNTER — Encounter: Payer: Self-pay | Admitting: Vascular Surgery

## 2015-11-27 NOTE — Progress Notes (Signed)
    Postoperative Access Visit   History of Present Illness  Courtney [name] is a 82 y.o. year old female who presents for postoperative follow-up for: Ligation of RUA AVF by Dr. Hart RochesterLawson (Date: 09/21/15).  The patient's wounds are healed.  The patient notes no steal symptoms.  The patient is able to complete their activities of daily living.  The patient's current symptoms are: recovering from hip fracture.  For VQI Use Only  PRE-ADM LIVING: Home  AMB STATUS: Wheelchair  Physical Examination Filed Vitals:   11/30/15 1349  BP: 110/56  Pulse: 59  Temp: 98.8 F (37.1 C)  Resp: 14    RUE: Incision is healed, skin feels warm, hand grip is 5/5, sensation in digits is intact, palpable thrill, bruit can be auscultated, palpable right radial pulse, thrombosed R BC AVF  Medical Decision Making  Courtney [name] is a 82 y.o. year old female who presents s/p ligation of RUA AVF.   R BC AVF remains thrombosed after ligation.  Pt is successful completing HD via L BC AVF at this point.  Pt can follow up as needed.  Thank you for allowing us to participate in this patient's care.  Leonides SakeBrian Zaray Gatchel, MD, FACS [facility] Office: [phone] Pager: [phone]

## 2015-11-30 ENCOUNTER — Encounter: Payer: Self-pay | Admitting: Vascular Surgery

## 2015-11-30 ENCOUNTER — Ambulatory Visit (INDEPENDENT_AMBULATORY_CARE_PROVIDER_SITE_OTHER): Payer: Medicare Other | Admitting: Vascular Surgery

## 2015-11-30 VITALS — BP 110/56 | HR 59 | Temp 98.8°F | Resp 14 | Ht 62.0 in | Wt 78.0 lb

## 2015-11-30 DIAGNOSIS — Z992 Dependence on renal dialysis: Secondary | ICD-10-CM

## 2015-11-30 DIAGNOSIS — N186 End stage renal disease: Secondary | ICD-10-CM

## 2015-12-07 ENCOUNTER — Other Ambulatory Visit: Payer: Self-pay | Admitting: Cardiology

## 2015-12-27 ENCOUNTER — Ambulatory Visit (HOSPITAL_COMMUNITY)
Admission: RE | Admit: 2015-12-27 | Discharge: 2015-12-27 | Disposition: A | Discharge status: Home / Self Care | Payer: Medicare Other | Source: Ambulatory Visit | Attending: Internal Medicine | Admitting: Internal Medicine

## 2015-12-27 ENCOUNTER — Other Ambulatory Visit (HOSPITAL_COMMUNITY): Payer: Self-pay | Admitting: Internal Medicine

## 2015-12-27 DIAGNOSIS — R600 Localized edema: Secondary | ICD-10-CM | POA: Diagnosis not present

## 2015-12-27 DIAGNOSIS — R609 Edema, unspecified: Secondary | ICD-10-CM | POA: Diagnosis not present

## 2015-12-27 NOTE — Progress Notes (Signed)
*  PRELIMINARY RESULTS* Vascular Ultrasound Left lower extremity venous duplex has been completed.  Preliminary findings: No evidence of DVT. Left baker's cyst noted.    [place] to [place].     Farrel DemarkJill Eunice, RDMS, RVT  12/27/2015, 4:02 PM

## 2016-01-09 ENCOUNTER — Other Ambulatory Visit: Payer: Self-pay

## 2016-02-01 ENCOUNTER — Emergency Department (HOSPITAL_COMMUNITY)
Admission: EM | Admit: 2016-02-01 | Discharge: 2016-02-02 | Disposition: A | Discharge status: Home / Self Care | Payer: Medicare Other | Attending: Emergency Medicine | Admitting: Emergency Medicine

## 2016-02-01 ENCOUNTER — Emergency Department (HOSPITAL_COMMUNITY): Payer: Medicare Other

## 2016-02-01 ENCOUNTER — Encounter (HOSPITAL_COMMUNITY): Payer: Self-pay

## 2016-02-01 DIAGNOSIS — Z7982 Long term (current) use of aspirin: Secondary | ICD-10-CM | POA: Insufficient documentation

## 2016-02-01 DIAGNOSIS — G309 Alzheimer's disease, unspecified: Secondary | ICD-10-CM | POA: Insufficient documentation

## 2016-02-01 DIAGNOSIS — I120 Hypertensive chronic kidney disease with stage 5 chronic kidney disease or end stage renal disease: Secondary | ICD-10-CM | POA: Diagnosis not present

## 2016-02-01 DIAGNOSIS — Z8673 Personal history of transient ischemic attack (TIA), and cerebral infarction without residual deficits: Secondary | ICD-10-CM | POA: Insufficient documentation

## 2016-02-01 DIAGNOSIS — Z87891 Personal history of nicotine dependence: Secondary | ICD-10-CM | POA: Diagnosis not present

## 2016-02-01 DIAGNOSIS — R7989 Other specified abnormal findings of blood chemistry: Secondary | ICD-10-CM

## 2016-02-01 DIAGNOSIS — I2510 Atherosclerotic heart disease of native coronary artery without angina pectoris: Secondary | ICD-10-CM | POA: Diagnosis not present

## 2016-02-01 DIAGNOSIS — I252 Old myocardial infarction: Secondary | ICD-10-CM | POA: Insufficient documentation

## 2016-02-01 DIAGNOSIS — N186 End stage renal disease: Secondary | ICD-10-CM | POA: Diagnosis not present

## 2016-02-01 DIAGNOSIS — R945 Abnormal results of liver function studies: Secondary | ICD-10-CM | POA: Diagnosis not present

## 2016-02-01 DIAGNOSIS — Z951 Presence of aortocoronary bypass graft: Secondary | ICD-10-CM | POA: Insufficient documentation

## 2016-02-01 DIAGNOSIS — R5383 Other fatigue: Secondary | ICD-10-CM | POA: Diagnosis not present

## 2016-02-01 DIAGNOSIS — R627 Adult failure to thrive: Secondary | ICD-10-CM

## 2016-02-01 DIAGNOSIS — R748 Abnormal levels of other serum enzymes: Secondary | ICD-10-CM | POA: Insufficient documentation

## 2016-02-01 DIAGNOSIS — Z992 Dependence on renal dialysis: Secondary | ICD-10-CM | POA: Diagnosis not present

## 2016-02-01 LAB — CBC
HCT: 28.8 % — ABNORMAL LOW (ref 36.0–46.0)
HEMOGLOBIN: 9.6 g/dL — AB (ref 12.0–15.0)
MCH: 25.9 pg — ABNORMAL LOW (ref 26.0–34.0)
MCHC: 33.3 g/dL (ref 30.0–36.0)
MCV: 77.6 fL — ABNORMAL LOW (ref 78.0–100.0)
Platelets: 93 10*3/uL — ABNORMAL LOW (ref 150–400)
RBC: 3.71 MIL/uL — ABNORMAL LOW (ref 3.87–5.11)
RDW: 15.5 % (ref 11.5–15.5)
WBC: 4.5 10*3/uL (ref 4.0–10.5)

## 2016-02-01 LAB — COMPREHENSIVE METABOLIC PANEL
ALK PHOS: 86 U/L (ref 38–126)
ALT: 108 U/L — ABNORMAL HIGH (ref 14–54)
ANION GAP: 11 (ref 5–15)
AST: 233 U/L — ABNORMAL HIGH (ref 15–41)
Albumin: 2.4 g/dL — ABNORMAL LOW (ref 3.5–5.0)
BILIRUBIN TOTAL: 0.9 mg/dL (ref 0.3–1.2)
BUN: 23 mg/dL — ABNORMAL HIGH (ref 6–20)
CALCIUM: 8.3 mg/dL — AB (ref 8.9–10.3)
CO2: 27 mmol/L (ref 22–32)
Chloride: 99 mmol/L — ABNORMAL LOW (ref 101–111)
Creatinine, Ser: 5.79 mg/dL — ABNORMAL HIGH (ref 0.44–1.00)
GFR, EST AFRICAN AMERICAN: 7 mL/min — AB (ref >60)
GFR, EST NON AFRICAN AMERICAN: 6 mL/min — AB (ref >60)
GLUCOSE: 116 mg/dL — AB (ref 65–99)
Potassium: 2.9 mmol/L — ABNORMAL LOW (ref 3.5–5.1)
Sodium: 137 mmol/L (ref 135–145)
TOTAL PROTEIN: 5.8 g/dL — AB (ref 6.5–8.1)

## 2016-02-01 LAB — LIPASE, BLOOD: Lipase: 101 U/L — ABNORMAL HIGH (ref 11–51)

## 2016-02-01 LAB — TYPE AND SCREEN
ABO/RH(D): AB POS
ANTIBODY SCREEN: NEGATIVE

## 2016-02-01 LAB — ABO/RH: ABO/RH(D): AB POS

## 2016-02-01 LAB — POC OCCULT BLOOD, ED: Fecal Occult Bld: NEGATIVE

## 2016-02-01 MED ORDER — IOPAMIDOL (ISOVUE-300) INJECTION 61%
INTRAVENOUS | Status: AC
  Filled 2016-02-01: qty 30

## 2016-02-01 MED ORDER — SODIUM CHLORIDE 0.9 % IV SOLN
Freq: Once | INTRAVENOUS | Status: AC
  Administered 2016-02-01: 1000 mL via INTRAVENOUS

## 2016-02-01 NOTE — ED Provider Notes (Signed)
  Patient sign out from Mohawk IndustriesJeff Hedges, PA-C @ 8 pm   "Female presents today with fatigue, elevated LFTs, hypokalemia, decreased by mouth intake. Patient pending plain films of the chest, further laboratory analysis, CT abdomen and pelvis. Patient care signed off to oncoming provider pending labs and imaging."  Dr. Donnald GarrePfeiffer has seen patient as well, if her CT abd/pelv is normal she recommends outpatient PCP and GI follow-up.  2:46 am - due to problems with radiology the patients images took a very long time to result.  CT abd/pelv shows   IMPRESSION: 1. Severe and extensive atherosclerosis. 2. Numerous renal cysts and small atrophied kidneys with extensive renal artery calcifications. 3. The pancreas appears somewhat prominent and low-attenuation. No obvious peripancreatic inflammation but recommend correlation with lipase and amylase levels to exclude pancreatitis. 4. No findings for bowel obstruction.   Electronically Signed   By: Rudie MeyerP.  Gallerani M.D.   On: 02/01/2016 23:41   The patient does not have any acute findings, while her pancreas is somewhat prominent her lipase is 100 and she has had no pain while in the ER. No masses or significant findings to explain elevated LFTs, per Dr. Donnald GarrePfeiffer the patient can follow-up with PCP and GI as an outpatient. The patients family member voices that they need to leave as the patient needs to get to dialysis at 5 am.        Marlon Peliffany Yovan Leeman, PA-C 02/02/16 [phone]    Arby BarretteMarcy Pfeiffer, MD 02/03/16 [phone]

## 2016-02-01 NOTE — ED Notes (Signed)
Patient transported to CT 

## 2016-02-01 NOTE — ED Notes (Signed)
Patient transported to X-ray 

## 2016-02-01 NOTE — ED Notes (Signed)
PA at bedside.

## 2016-02-01 NOTE — ED Provider Notes (Signed)
[facility]-EMERGENCY DEPT Provider Note   CSN: [id number] Arrival date & time: 02/01/16  1251     History   Chief Complaint Chief Complaint  Patient presents with  . Fatigue    HPI Courtney [name] is a 82 y.o. female.  HPI   82 year old female presents today with increasing fatigue. Patient has a history of dementia, but provides somewhat reasonable answers. Daughter is at bedside who provides the majority of information. She reports over the last week patient has progressive weakness, decreased by mouth intake she notes that patient will be very little if anything. She notes a short distance of ambulation causes extreme fatigue, denies any shortness of breath cough, fever or chills. She notes that she followed up with her primary care yesterday, was called today with lab results reporting that her hemoglobin was low and her liver enzymes were elevated and to come to the emergency room for evaluation of gastrointestinal bleed. She denies any dark stools, blood per rectum; patient is on Plavix. Patient denies any infectious etiology, abdominal pain. No history of abdominal surgeries.   Past Medical History:  Diagnosis Date  . Alzheimer's dementia   . Anemia due to chronic renal failure treated with erythropoietin   . CAD in native artery 1997   CABG 1997, Last stress test 10/09 no ischemia  . Carotid disease, bilateral (HCC)    Most recent Dopplers April 2014: Right carotid 50-69% (lower range);  lleft less than 50%.  . Dyslipidemia   . 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 H/O pulmonary fibrosis   . Hx of sickle cell trait   . Hypertension   . Hypertension, renal disease   . Ischemic cardiomyopathy    EF by echo 11/2010 EF 35-45%, LA is  moderately dilated, mod. MR, mod. to severe TR, RV systolic pressure 40-50 mmHg  . Moderate mitral regurgitation 11/2010  . Myocardial infarction (HCC) 12-03-1995  . Osteoarthritis   . Peripheral vascular disease (HCC) 03/2008   Stents to rt SFA, Dopplers from April 2014: No significant change, last PV cath 03/29/08  -- fairly widely patent R. SFA stent. Left SFA less than 50%. Proximal but occluded in the mid vessel with reconstitution distally. Right PT and AP occluded as described previously an angiography. Left AP occluded also noted angiographically   . S/P CABG x 5 1997   LIMA-LAD, SVG-OM, Seq SVG-RPDA-RPL  . Shortness of breath    RELATED TO DIALYSIS -TOO MUCH FLUID  . Stroke, lacunar (HCC) 1994  . Vulvar lesion     Patient Active Problem List   Diagnosis Date Noted  . S/p left hip fracture 10/05/2015  . Hip fracture (HCC) 09/30/2015  . Hypertension 09/30/2015  . Bradycardia 09/30/2015  . Hypokalemia 09/30/2015  . Coronary artery disease involving native coronary artery without angina pectoris 09/21/2015  . ESRD on dialysis (HCC) 09/12/2015  . CAD (coronary artery disease) of artery bypass graft 07/24/2013  . Dyslipidemia 07/24/2013  . Hypertension, renal disease   . Carotid disease, bilateral (HCC)   . Ischemic cardiomyopathy   . Mechanical complication of other vascular device, implant, and graft 11/24/2012  . Leg pain 07/11/2011  . Other complications due to renal dialysis device, implant, and graft  07/11/2011  . End stage renal disease (HCC) 07/11/2011  . VIN III (vulvar intraepithelial neoplasia III) 05/22/2011  . Carcinoma in situ of breast and genitourinary system 04/11/2011  . Kidney disease 03/12/2011  . Peripheral vascular disease (HCC) 03/13/2008    Past Surgical History:  Procedure Laterality Date  . ABDOMINAL AORTAGRAM N/A 07/18/2011   Procedure: ABDOMINAL Ronny Flurry;  Surgeon: Fransisco Hertz, MD;  Location: [facility] CATH LAB;  Service: Cardiovascular;  Laterality:  N/A;  . AV FISTULA PLACEMENT Right 2001   AVF  . CHOLECYSTECTOMY    . CORONARY ARTERY BYPASS GRAFT  1997   LIMA-LAD; seq VG-PDA&PLA; VG-OM  . FEMORAL ARTERY STENT Right 03/2008   6 mm X 150 mm + 6 mm x 100 mm SMART stent;;   . INTRAMEDULLARY (IM) NAIL INTERTROCHANTERIC Left 10/01/2015   Procedure: INTRAMEDULLARY (IM) NAIL INTERTROCHANTRIC;  Surgeon: Sheral Apley, MD;  Location: [facility] OR;  Service: Orthopedics;  Laterality: Left;  . LIGATION OF ARTERIOVENOUS  FISTULA Right 09/21/2015   Procedure: LIGATION OF RIGHT UPPER ARM ARTERIOVENOUS  FISTULA;  Surgeon: Pryor Ochoa, MD;  Location: [facility] OR;  Service: Vascular;  Laterality: Right;  . Moles      from thigh s removed.  Marland Kitchen SHUNTOGRAM N/A 09/05/2011   Procedure: Betsey Amen;  Surgeon: Fransisco Hertz, MD;  Location: [facility] CATH LAB;  Service: Cardiovascular;  Laterality: N/A;  . VULVAR LESION REMOVAL  04/23/2011   Procedure: VULVAR LESION;  Surgeon: Laurette Schimke, MD;  Location: [facility] ORS;  Service: Gynecology;  Laterality: N/A;  Wide Local Excision Of Vulvar Lesion     OB History    No data available       Home Medications    Prior to Admission medications   Medication Sig Start Date End Date Taking? Authorizing Provider  acetaminophen (TYLENOL) 325 MG tablet Take 650 mg by mouth every 6 (six) hours as needed. Reported on 11/30/2015    Historical Provider, MD  acetaminophen (TYLENOL) 500 MG tablet Take 1,000 mg by mouth 2 (two) times daily.    Historical Provider, MD  Artificial Tear Ointment (DRY EYES OP) Place 1 drop into both eyes daily as needed (for dry eyes).     Historical Provider, MD  aspirin EC 325 MG tablet Take 325 mg by mouth daily. Reported on 11/30/2015    Historical Provider, MD  atorvastatin (LIPITOR) 80 MG tablet Take 1 tablet (80 mg total) by mouth daily. 09/22/15   Marykay Lex, MD  B Complex-C-Folic Acid (DIALYVITE 800 PO) Take 1 capsule by mouth daily.     Historical Provider, MD  calcium acetate (PHOSLO) 667 MG capsule  Take 667 mg by mouth 3 (three) times daily with meals.     Historical Provider, MD  carvedilol (COREG) 12.5 MG tablet Take 1 tablet (12.5 mg total) by mouth 2 (two) times daily with a meal. 09/22/15   Marykay Lex, MD  clopidogrel (PLAVIX) 75 MG tablet TAKE ONE TABLET BY MOUTH ONCE DAILY 12/08/15   Marykay Lex, MD  ferrous sulfate 325 (65 FE) MG tablet Take 325 mg by mouth daily with breakfast.    Historical Provider, MD  latanoprost (XALATAN) 0.005 % ophthalmic solution Place 1 drop into both eyes at bedtime. Wait 3 - 5 minutes between 2 eye meds    Historical Provider, MD  levothyroxine (SYNTHROID, LEVOTHROID) 25 MCG tablet Take 12.5 mcg by mouth daily before breakfast.    Historical Provider, MD  Nutritional Supplements (FEEDING SUPPLEMENT, NEPRO CARB STEADY,)  LIQD Take 237 mLs by mouth 2 (two) times daily between meals. 10/03/15   Alison Murray, MD  senna-docusate (SENOKOT-S) 8.6-50 MG tablet Take 1 tablet by mouth at bedtime as needed for mild constipation. 10/03/15   Alison Murray, MD    Family History Family History  Problem Relation Age of Onset  . Heart disease Mother   . Sudden death Mother 82  . Sudden death Father 23  . Stroke Father   . Sudden death Brother 24  . Heart disease Brother   . Heart disease Sister   . Sudden death Sister 9  . Colon cancer Neg Hx   . Anesthesia problems Neg Hx     Social History Social History  Substance Use Topics  . Smoking status: Former Smoker    Types: Cigarettes    Quit date: 11/01/1991  . Smokeless tobacco: Never Used  . Alcohol use No     Allergies   Aspirin and Pletal [cilostazol]   Review of Systems Review of Systems  All other systems reviewed and are negative.   Physical Exam Updated Vital Signs BP (!) 131/53   Pulse (!) 51   Resp 16   SpO2 98%   Physical Exam  Constitutional: She is oriented to person, place, and time. She appears well-developed and well-nourished.  HENT:  Head: Normocephalic and  atraumatic.  Eyes: Conjunctivae are normal. Pupils are equal, round, and reactive to light. Right eye exhibits no discharge. Left eye exhibits no discharge. No scleral icterus.  Neck: Normal range of motion. No JVD present. No tracheal deviation present.  Pulmonary/Chest: Effort normal. No stridor.  Abdominal: Soft. She exhibits no distension and no mass. There is no tenderness. There is no rebound and no guarding. No hernia.  Genitourinary:  Genitourinary Comments: No blood on rectal exam  Neurological: She is alert and oriented to person, place, and time. Coordination normal.  Psychiatric: She has a normal mood and affect. Her behavior is normal. Judgment and thought content normal.  Nursing note and vitals reviewed.    ED Treatments / Results  Labs (all labs ordered are listed, but only abnormal results are displayed) Labs Reviewed  COMPREHENSIVE METABOLIC PANEL - Abnormal; Notable for the following:       Result Value   Potassium 2.9 (*)    Chloride 99 (*)    Glucose, Bld 116 (*)    BUN 23 (*)    Creatinine, Ser 5.79 (*)    Calcium 8.3 (*)    Total Protein 5.8 (*)    Albumin 2.4 (*)    AST 233 (*)    ALT 108 (*)    GFR calc non Af Amer 6 (*)    GFR calc Af Amer 7 (*)    All other components within normal limits  CBC - Abnormal; Notable for the following:    RBC 3.71 (*)    Hemoglobin 9.6 (*)    HCT 28.8 (*)    MCV 77.6 (*)    MCH 25.9 (*)    Platelets 93 (*)    All other components within normal limits  LIPASE, BLOOD  POC OCCULT BLOOD, ED  TYPE AND SCREEN  ABO/RH    EKG  EKG Interpretation None       Radiology No results found.  Procedures Procedures (including critical care time)  Medications Ordered in ED Medications  0.9 %  sodium chloride infusion (not administered)    Initial Impression / Assessment and Plan / ED Course  I have reviewed the triage vital signs and the nursing notes.  Pertinent labs & imaging results that were available during  my care of the patient were reviewed by me and considered in my medical decision making (see chart for details).  Clinical Course    Final Clinical Impressions(s) / ED Diagnoses   Final diagnoses:  Elevated LFTs   Labs:   Imaging:  Consults:  Therapeutics:  Discharge Meds:   Assessment/Plan:    Female presents today with fatigue, elevated LFTs, hypokalemia, decreased by mouth intake. Patient pending plain films of the chest, further laboratory analysis, CT abdomen and pelvis. Patient care signed off to oncoming provider pending labs and imaging.   New Prescriptions New Prescriptions   No medications on file     Eyvonne MechanicJeffrey Treyten Monestime, PA-C 02/01/16 2023    Arby BarretteMarcy Pfeiffer, MD 02/02/16 [phone]

## 2016-02-01 NOTE — ED Triage Notes (Addendum)
Pt brought in by her daughter with c/o fatigue and hypotension. She was seen at here PCP yesterday for fatigue and was called today and told to bring her to the ER for a hgb of 9 and "elevated enzymes." Her PCP wants her to be evaluated for possible GI bleed. Denies bloody stools or emesis. Pt gets dialyzed MWF, last went Monday. She did not go yesterday because she did not fell well.

## 2016-02-02 DIAGNOSIS — R5383 Other fatigue: Secondary | ICD-10-CM | POA: Diagnosis not present

## 2016-02-13 ENCOUNTER — Encounter (HOSPITAL_COMMUNITY): Payer: Self-pay

## 2016-02-13 ENCOUNTER — Emergency Department (HOSPITAL_COMMUNITY): Payer: Medicare Other

## 2016-02-13 ENCOUNTER — Observation Stay (HOSPITAL_COMMUNITY)
Admission: EM | Admit: 2016-02-13 | Discharge: 2016-02-14 | Disposition: A | Discharge status: Home / Self Care | Payer: Medicare Other | Attending: Internal Medicine | Admitting: Internal Medicine

## 2016-02-13 DIAGNOSIS — R531 Weakness: Secondary | ICD-10-CM | POA: Diagnosis present

## 2016-02-13 DIAGNOSIS — Z951 Presence of aortocoronary bypass graft: Secondary | ICD-10-CM | POA: Diagnosis not present

## 2016-02-13 DIAGNOSIS — Z992 Dependence on renal dialysis: Secondary | ICD-10-CM | POA: Insufficient documentation

## 2016-02-13 DIAGNOSIS — Z7189 Other specified counseling: Secondary | ICD-10-CM

## 2016-02-13 DIAGNOSIS — Z8673 Personal history of transient ischemic attack (TIA), and cerebral infarction without residual deficits: Secondary | ICD-10-CM | POA: Diagnosis not present

## 2016-02-13 DIAGNOSIS — T68XXXA Hypothermia, initial encounter: Secondary | ICD-10-CM | POA: Diagnosis present

## 2016-02-13 DIAGNOSIS — I214 Non-ST elevation (NSTEMI) myocardial infarction: Secondary | ICD-10-CM | POA: Diagnosis not present

## 2016-02-13 DIAGNOSIS — E162 Hypoglycemia, unspecified: Secondary | ICD-10-CM | POA: Diagnosis not present

## 2016-02-13 DIAGNOSIS — I2510 Atherosclerotic heart disease of native coronary artery without angina pectoris: Secondary | ICD-10-CM | POA: Diagnosis not present

## 2016-02-13 DIAGNOSIS — Z87891 Personal history of nicotine dependence: Secondary | ICD-10-CM | POA: Diagnosis not present

## 2016-02-13 DIAGNOSIS — Z86000 Personal history of in-situ neoplasm of breast: Secondary | ICD-10-CM | POA: Diagnosis not present

## 2016-02-13 DIAGNOSIS — G309 Alzheimer's disease, unspecified: Secondary | ICD-10-CM | POA: Diagnosis not present

## 2016-02-13 DIAGNOSIS — R748 Abnormal levels of other serum enzymes: Secondary | ICD-10-CM | POA: Diagnosis not present

## 2016-02-13 DIAGNOSIS — Z515 Encounter for palliative care: Secondary | ICD-10-CM

## 2016-02-13 DIAGNOSIS — F0390 Unspecified dementia without behavioral disturbance: Secondary | ICD-10-CM | POA: Diagnosis present

## 2016-02-13 DIAGNOSIS — I120 Hypertensive chronic kidney disease with stage 5 chronic kidney disease or end stage renal disease: Secondary | ICD-10-CM | POA: Insufficient documentation

## 2016-02-13 DIAGNOSIS — Z7902 Long term (current) use of antithrombotics/antiplatelets: Secondary | ICD-10-CM | POA: Insufficient documentation

## 2016-02-13 DIAGNOSIS — R778 Other specified abnormalities of plasma proteins: Secondary | ICD-10-CM | POA: Diagnosis present

## 2016-02-13 DIAGNOSIS — R718 Other abnormality of red blood cells: Secondary | ICD-10-CM | POA: Diagnosis not present

## 2016-02-13 DIAGNOSIS — N186 End stage renal disease: Secondary | ICD-10-CM | POA: Insufficient documentation

## 2016-02-13 DIAGNOSIS — R7989 Other specified abnormal findings of blood chemistry: Secondary | ICD-10-CM

## 2016-02-13 DIAGNOSIS — F03C0 Unspecified dementia, severe, without behavioral disturbance, psychotic disturbance, mood disturbance, and anxiety: Secondary | ICD-10-CM | POA: Diagnosis present

## 2016-02-13 LAB — CBG MONITORING, ED
GLUCOSE-CAPILLARY: 13 mg/dL — AB (ref 65–99)
GLUCOSE-CAPILLARY: 200 mg/dL — AB (ref 65–99)
GLUCOSE-CAPILLARY: 217 mg/dL — AB (ref 65–99)
Glucose-Capillary: 196 mg/dL — ABNORMAL HIGH (ref 65–99)
Glucose-Capillary: 134 mg/dL — ABNORMAL HIGH (ref 65–99)

## 2016-02-13 LAB — I-STAT TROPONIN, ED: Troponin i, poc: 14.21 ng/mL (ref 0.00–0.08)

## 2016-02-13 LAB — I-STAT CHEM 8, ED
BUN: 40 mg/dL — AB (ref 6–20)
CHLORIDE: 101 mmol/L (ref 101–111)
CREATININE: 4.9 mg/dL — AB (ref 0.44–1.00)
Calcium, Ion: 1.03 mmol/L — ABNORMAL LOW (ref 1.15–1.40)
GLUCOSE: 90 mg/dL (ref 65–99)
HCT: 26 % — ABNORMAL LOW (ref 36.0–46.0)
HEMOGLOBIN: 8.8 g/dL — AB (ref 12.0–15.0)
POTASSIUM: 3.5 mmol/L (ref 3.5–5.1)
Sodium: 141 mmol/L (ref 135–145)
TCO2: 31 mmol/L (ref 0–100)

## 2016-02-13 LAB — CBC WITH DIFFERENTIAL/PLATELET
BASOS ABS: 0 10*3/uL (ref 0.0–0.1)
Basophils Relative: 0 %
Eosinophils Absolute: 0 10*3/uL (ref 0.0–0.7)
Eosinophils Relative: 0 %
HEMATOCRIT: 23.7 % — AB (ref 36.0–46.0)
HEMOGLOBIN: 7.7 g/dL — AB (ref 12.0–15.0)
LYMPHS PCT: 4 %
Lymphs Abs: 0.5 10*3/uL — ABNORMAL LOW (ref 0.7–4.0)
MCH: 25.7 pg — ABNORMAL LOW (ref 26.0–34.0)
MCHC: 32.5 g/dL (ref 30.0–36.0)
MCV: 79 fL (ref 78.0–100.0)
MONO ABS: 0.5 10*3/uL (ref 0.1–1.0)
Monocytes Relative: 4 %
NEUTROS ABS: 9.7 10*3/uL — AB (ref 1.7–7.7)
NEUTROS PCT: 91 %
Platelets: 83 10*3/uL — ABNORMAL LOW (ref 150–400)
RBC: 3 MIL/uL — ABNORMAL LOW (ref 3.87–5.11)
RDW: 17.6 % — AB (ref 11.5–15.5)
WBC: 10.6 10*3/uL — ABNORMAL HIGH (ref 4.0–10.5)

## 2016-02-13 LAB — PROTIME-INR
INR: 1.05
Prothrombin Time: 13.8 seconds (ref 11.4–15.2)

## 2016-02-13 LAB — BASIC METABOLIC PANEL
ANION GAP: 13 (ref 5–15)
BUN: 37 mg/dL — ABNORMAL HIGH (ref 6–20)
CO2: 26 mmol/L (ref 22–32)
Calcium: 7.8 mg/dL — ABNORMAL LOW (ref 8.9–10.3)
Chloride: 103 mmol/L (ref 101–111)
Creatinine, Ser: 4.97 mg/dL — ABNORMAL HIGH (ref 0.44–1.00)
GFR calc Af Amer: 8 mL/min — ABNORMAL LOW (ref >60)
GFR, EST NON AFRICAN AMERICAN: 7 mL/min — AB (ref >60)
GLUCOSE: 266 mg/dL — AB (ref 65–99)
POTASSIUM: 3.2 mmol/L — AB (ref 3.5–5.1)
Sodium: 142 mmol/L (ref 135–145)

## 2016-02-13 LAB — I-STAT CG4 LACTIC ACID, ED: LACTIC ACID, VENOUS: 2.66 mmol/L — AB (ref 0.5–1.9)

## 2016-02-13 LAB — MRSA PCR SCREENING: MRSA BY PCR: NEGATIVE

## 2016-02-13 LAB — TSH: TSH: 56.155 u[IU]/mL — ABNORMAL HIGH (ref 0.350–4.500)

## 2016-02-13 MED ORDER — PIPERACILLIN-TAZOBACTAM 3.375 G IVPB
3.3750 g | Freq: Two times a day (BID) | INTRAVENOUS | Status: DC
  Administered 2016-02-13 – 2016-02-14 (×2): 3.375 g via INTRAVENOUS
  Filled 2016-02-13 (×3): qty 50

## 2016-02-13 MED ORDER — CLOPIDOGREL BISULFATE 75 MG PO TABS
75.0000 mg | ORAL_TABLET | Freq: Every day | ORAL | Status: DC
  Administered 2016-02-14: 75 mg via ORAL
  Filled 2016-02-13: qty 1

## 2016-02-13 MED ORDER — HYDROCODONE-ACETAMINOPHEN 5-325 MG PO TABS: 1.0000 | ORAL_TABLET | ORAL | Status: DC | PRN

## 2016-02-13 MED ORDER — SODIUM CHLORIDE 0.9 % IV BOLUS (SEPSIS)
500.0000 mL | Freq: Once | INTRAVENOUS | Status: AC
  Administered 2016-02-13: 500 mL via INTRAVENOUS

## 2016-02-13 MED ORDER — ACETAMINOPHEN 325 MG PO TABS: 325.0000 mg | ORAL_TABLET | Freq: Four times a day (QID) | ORAL | Status: DC | PRN

## 2016-02-13 MED ORDER — ATORVASTATIN CALCIUM 80 MG PO TABS
80.0000 mg | ORAL_TABLET | Freq: Every day | ORAL | Status: DC
  Administered 2016-02-14: 80 mg via ORAL
  Filled 2016-02-13: qty 1

## 2016-02-13 MED ORDER — NEPRO/CARBSTEADY PO LIQD
237.0000 mL | Freq: Two times a day (BID) | ORAL | Status: DC
  Filled 2016-02-13 (×4): qty 237

## 2016-02-13 MED ORDER — SENNOSIDES-DOCUSATE SODIUM 8.6-50 MG PO TABS: 1.0000 | ORAL_TABLET | Freq: Every evening | ORAL | Status: DC | PRN

## 2016-02-13 MED ORDER — ONDANSETRON HCL 4 MG PO TABS: 4.0000 mg | ORAL_TABLET | Freq: Four times a day (QID) | ORAL | Status: DC | PRN

## 2016-02-13 MED ORDER — ONDANSETRON HCL 4 MG/2ML IJ SOLN: 4.0000 mg | Freq: Four times a day (QID) | INTRAMUSCULAR | Status: DC | PRN

## 2016-02-13 MED ORDER — ACETAMINOPHEN 650 MG RE SUPP: 650.0000 mg | Freq: Four times a day (QID) | RECTAL | Status: DC | PRN

## 2016-02-13 MED ORDER — ACETAMINOPHEN 325 MG PO TABS
650.0000 mg | ORAL_TABLET | Freq: Four times a day (QID) | ORAL | Status: DC | PRN
  Administered 2016-02-14: 650 mg via ORAL
  Filled 2016-02-13: qty 2

## 2016-02-13 MED ORDER — SODIUM CHLORIDE 0.9 % IV SOLN: Freq: Once | INTRAVENOUS | Status: DC

## 2016-02-13 MED ORDER — DEXTROSE 50 % IV SOLN
1.0000 | Freq: Once | INTRAVENOUS | Status: AC
  Administered 2016-02-13: 50 mL via INTRAVENOUS

## 2016-02-13 MED ORDER — HEPARIN SODIUM (PORCINE) 5000 UNIT/ML IJ SOLN
5000.0000 [IU] | Freq: Three times a day (TID) | INTRAMUSCULAR | Status: DC
  Administered 2016-02-13 – 2016-02-14 (×2): 5000 [IU] via SUBCUTANEOUS
  Filled 2016-02-13 (×2): qty 1

## 2016-02-13 MED ORDER — LATANOPROST 0.005 % OP SOLN
1.0000 [drp] | Freq: Every day | OPHTHALMIC | Status: DC
  Administered 2016-02-13: 1 [drp] via OPHTHALMIC
  Filled 2016-02-13: qty 2.5

## 2016-02-13 MED ORDER — SODIUM CHLORIDE 0.9% FLUSH
3.0000 mL | Freq: Two times a day (BID) | INTRAVENOUS | Status: DC
  Administered 2016-02-13: 3 mL via INTRAVENOUS

## 2016-02-13 MED ORDER — SODIUM CHLORIDE 0.9 % IV SOLN
INTRAVENOUS | Status: DC
  Administered 2016-02-13: 22:00:00 via INTRAVENOUS

## 2016-02-13 MED ORDER — DEXTROSE 50 % IV SOLN
INTRAVENOUS | Status: AC
  Filled 2016-02-13: qty 100

## 2016-02-13 MED ORDER — VANCOMYCIN HCL IN DEXTROSE 750-5 MG/150ML-% IV SOLN
750.0000 mg | Freq: Once | INTRAVENOUS | Status: AC
  Administered 2016-02-13: 750 mg via INTRAVENOUS
  Filled 2016-02-13: qty 150

## 2016-02-13 MED ORDER — CARVEDILOL 12.5 MG PO TABS
12.5000 mg | ORAL_TABLET | Freq: Two times a day (BID) | ORAL | Status: DC
  Administered 2016-02-13 – 2016-02-14 (×2): 12.5 mg via ORAL
  Filled 2016-02-13 (×2): qty 1

## 2016-02-13 NOTE — ED Notes (Signed)
Asher MuirJamie PA aware of the pts troponin.

## 2016-02-13 NOTE — H&P (Signed)
History and Physical    Courtney [name] ZOX:[id number] DOB: 14-Apr-1931 DOA: 02/13/2016  PCP: Oneal Grout, MD  Patient coming from: Home, lives with her daughter Courtney [name]  Chief Complaint: Generalized weakness/failure to thrive  HPI: Courtney [name] is a 82 y.o. female with medical history significant of ESRD, Alzheimer dementia and CAD. Patient came to the hospital because of generalized weakness and failure to thrive. Per daughter patient was not doing very well for the past 2 weeks, not able to eat or drink much, she had drop in her hemoglobin less than she had dialysis on Friday 9/29, skipped dialysis last Monday because of weakness and unable to get up. She brought to the hospital because of generalized weakness, she was found to have temperature of 92.4, was hypoxic, blood pressure 91/49, CBG was 13, and troponin of 14.2.  ED Course:  Vitals: Temp 92.4, pulse is 49, blood pressure 91/49 and was 75% on room air place on rebreather mask. Labs: Hemoglobin is 7.7, WBCs 10.6, platelets 83, ESRD potassium 3.2 Imaging: CXR without acute findings Interventions: Given D50 ampule, cardiology consulted  Review of Systems:  Constitutional: negative for anorexia, fevers and sweats Eyes: negative for irritation, redness and visual disturbance Ears, nose, mouth, throat, and face: negative for earaches, epistaxis, nasal congestion and sore throat Respiratory: negative for cough, dyspnea on exertion, sputum and wheezing Cardiovascular: negative for chest pain, dyspnea, lower extremity edema, orthopnea, palpitations and syncope Gastrointestinal: negative for abdominal pain, constipation, diarrhea, melena, nausea and vomiting Genitourinary:negative for dysuria, frequency and hematuria Hematologic/lymphatic: negative for bleeding, easy bruising and lymphadenopathy Musculoskeletal:negative for arthralgias, muscle weakness and stiff joints Neurological: negative for coordination problems, gait  problems, headaches and weakness Endocrine: negative for diabetic symptoms including polydipsia, polyuria and weight loss Allergic/Immunologic: negative for anaphylaxis, hay fever and urticaria  Past Medical History:  Diagnosis Date  . Alzheimer's dementia   . Anemia due to chronic renal failure treated with erythropoietin   . CAD in native artery 1997   CABG 1997, Last stress test 10/09 no ischemia  . Carotid disease, bilateral (HCC)    Most recent Dopplers April 2014: Right carotid 50-69% (lower range);  lleft less than 50%.  . Dyslipidemia   . 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 H/O pulmonary fibrosis   . Hx of sickle cell trait   . Hypertension   . Hypertension, renal disease   . Ischemic cardiomyopathy    EF by echo 11/2010 EF 35-45%, LA is moderately dilated, mod. MR, mod. to severe TR, RV systolic pressure 40-50 mmHg  . Moderate mitral regurgitation 11/2010  . Myocardial infarction 12-03-1995  . Osteoarthritis   . Peripheral vascular disease (HCC) 03/2008   Stents to rt SFA, Dopplers from April 2014: No significant change, last PV cath 03/29/08  -- fairly widely patent R. SFA stent. Left SFA less than 50%. Proximal but occluded in the mid vessel with reconstitution distally. Right PT and AP occluded as described previously an angiography. Left AP occluded also noted angiographically   . S/P CABG x 5 1997   LIMA-LAD, SVG-OM, Seq SVG-RPDA-RPL  . Shortness of breath    RELATED TO DIALYSIS -TOO MUCH FLUID  . Stroke, lacunar (HCC) 1994  .  Vulvar lesion     Past Surgical History:  Procedure Laterality Date  . ABDOMINAL AORTAGRAM N/A 07/18/2011   Procedure: ABDOMINAL Ronny Flurry;  Surgeon: Fransisco Hertz, MD;  Location: [facility] CATH LAB;  Service:  Cardiovascular;  Laterality: N/A;  . AV FISTULA PLACEMENT Right 2001   AVF  . CHOLECYSTECTOMY    . CORONARY ARTERY BYPASS GRAFT  1997   LIMA-LAD; seq VG-PDA&PLA; VG-OM  . FEMORAL ARTERY STENT Right 03/2008   6 mm X 150 mm + 6 mm x 100 mm SMART stent;;   . INTRAMEDULLARY (IM) NAIL INTERTROCHANTERIC Left 10/01/2015   Procedure: INTRAMEDULLARY (IM) NAIL INTERTROCHANTRIC;  Surgeon: Sheral Apley, MD;  Location: [facility] OR;  Service: Orthopedics;  Laterality: Left;  . LIGATION OF ARTERIOVENOUS  FISTULA Right 09/21/2015   Procedure: LIGATION OF RIGHT UPPER ARM ARTERIOVENOUS  FISTULA;  Surgeon: Pryor Ochoa, MD;  Location: [facility] OR;  Service: Vascular;  Laterality: Right;  . Moles      from thigh s removed.  Marland Kitchen SHUNTOGRAM N/A 09/05/2011   Procedure: Betsey Amen;  Surgeon: Fransisco Hertz, MD;  Location: [facility] CATH LAB;  Service: Cardiovascular;  Laterality: N/A;  . VULVAR LESION REMOVAL  04/23/2011   Procedure: VULVAR LESION;  Surgeon: Laurette Schimke, MD;  Location: [facility] ORS;  Service: Gynecology;  Laterality: N/A;  Wide Local Excision Of Vulvar Lesion      reports that she quit smoking about 24 years ago. Her smoking use included Cigarettes. She has never used smokeless tobacco. She reports that she does not drink alcohol or use drugs.  Allergies  Allergen Reactions  . Aspirin Itching  . Pletal [Cilostazol] Other (See Comments)    DIZZINESS    Family History  Problem Relation Age of Onset  . Heart disease Mother   . Sudden death Mother 48  . Sudden death Father 69  . Stroke Father   . Sudden death Brother 22  . Heart disease Brother   . Heart disease Sister   . Sudden death Sister 61  . Colon cancer Neg Hx   . Anesthesia problems Neg Hx     Prior to Admission medications   Medication Sig Start Date End Date Taking? Authorizing Provider  acetaminophen (TYLENOL) 325 MG tablet Take 325-650 mg by mouth every 6 (six) hours as needed (for pain). Reported on 11/30/2015   Yes Historical Provider, MD    atorvastatin (LIPITOR) 80 MG tablet Take 1 tablet (80 mg total) by mouth daily. 09/22/15  Yes Marykay Lex, MD  carvedilol (COREG) 12.5 MG tablet Take 1 tablet (12.5 mg total) by mouth 2 (two) times daily with a meal. 09/22/15  Yes Marykay Lex, MD  clopidogrel (PLAVIX) 75 MG tablet TAKE ONE TABLET BY MOUTH ONCE DAILY Patient taking differently: Take 75 mg by mouth once a day 12/08/15  Yes Marykay Lex, MD  latanoprost (XALATAN) 0.005 % ophthalmic solution Place 1 drop into both eyes at bedtime.    Yes Historical Provider, MD  Nutritional Supplements (FEEDING SUPPLEMENT, NEPRO CARB STEADY,) LIQD Take 237 mLs by mouth 2 (two) times daily between meals. 10/03/15  Yes Alison Murray, MD  senna-docusate (SENOKOT-S) 8.6-50 MG tablet Take 1 tablet by mouth at bedtime as needed for mild constipation. 10/03/15  Yes Alison Murray, MD    Physical Exam:  Vitals:   02/13/16 1640 02/13/16 1645 02/13/16 1652 02/13/16 1720  BP: 102/61 161/98  99/55  Pulse:    (!) 52  Resp: (!) 27 26  23  Temp:   (!) 93.4 F (34.1 C)   TempSrc:   Rectal   SpO2:    100%  Weight:      Height:        Constitutional: NAD, calm, comfortable Eyes: PERRL, lids and conjunctivae normal ENMT: Mucous membranes are moist. Posterior pharynx clear of any exudate or lesions.Normal dentition.  Neck: normal, supple, no masses, no thyromegaly Respiratory: clear to auscultation bilaterally, no wheezing, no crackles. Normal respiratory effort. No accessory muscle use.  Cardiovascular: Regular rate and rhythm, no murmurs / rubs / gallops. No extremity edema. 2+ pedal pulses. No carotid bruits.  Abdomen: no tenderness, no masses palpated. No hepatosplenomegaly. Bowel sounds positive.  Musculoskeletal: no clubbing / cyanosis. No joint deformity upper and lower extremities. Good ROM, no contractures. Normal muscle tone.  Skin: no rashes, lesions, ulcers. No induration Neurologic: CN 2-12 grossly intact. Sensation intact, DTR normal.  Strength 5/5 in all 4.  Psychiatric: Normal judgment and insight. Alert and oriented x 3. Normal mood.   Labs on Admission: I have personally reviewed following labs and imaging studies  CBC:  Recent Labs Lab 02/13/16 1523 02/13/16 1602  WBC 10.6*  --   NEUTROABS 9.7*  --   HGB 7.7* 8.8*  HCT 23.7* 26.0*  MCV 79.0  --   PLT 83*  --    Basic Metabolic Panel:  Recent Labs Lab 02/13/16 1523 02/13/16 1602  NA 142 141  K 3.2* 3.5  CL 103 101  CO2 26  --   GLUCOSE 266* 90  BUN 37* 40*  CREATININE 4.97* 4.90*  CALCIUM 7.8*  --    GFR: Estimated Creatinine Clearance: 4.4 mL/min (by C-G formula based on SCr of 4.9 mg/dL (H)). Liver Function Tests: No results for input(s): AST, ALT, ALKPHOS, BILITOT, PROT, ALBUMIN in the last 168 hours. No results for input(s): LIPASE, AMYLASE in the last 168 hours. No results for input(s): AMMONIA in the last 168 hours. Coagulation Profile: No results for input(s): INR, PROTIME in the last 168 hours. Cardiac Enzymes: No results for input(s): CKTOTAL, CKMB, CKMBINDEX, TROPONINI in the last 168 hours. BNP (last 3 results) No results for input(s): PROBNP in the last 8760 hours. HbA1C: No results for input(s): HGBA1C in the last 72 hours. CBG:  Recent Labs Lab 02/13/16 1510 02/13/16 1526 02/13/16 1541 02/13/16 1650  GLUCAP 13* 200* 217* 196*   Lipid Profile: No results for input(s): CHOL, HDL, LDLCALC, TRIG, CHOLHDL, LDLDIRECT in the last 72 hours. Thyroid Function Tests: No results for input(s): TSH, T4TOTAL, FREET4, T3FREE, THYROIDAB in the last 72 hours. Anemia Panel: No results for input(s): VITAMINB12, FOLATE, FERRITIN, TIBC, IRON, RETICCTPCT in the last 72 hours. Urine analysis: No results found for: COLORURINE, APPEARANCEUR, LABSPEC, PHURINE, GLUCOSEU, HGBUR, BILIRUBINUR, KETONESUR, PROTEINUR, UROBILINOGEN, NITRITE, LEUKOCYTESUR Sepsis Labs: !!!!!!!!!!!!!!!!!!!!!!!!!!!!!!!!!!!!!!!!!!!! Invalid input(s): PROCALCITONIN,  LACTICIDVEN No results found for this or any previous visit (from the past 240 hour(s)).   Radiological Exams on Admission: Dg Chest Portable 1 View  Result Date: 02/13/2016 CLINICAL DATA:  Weakness, coronary artery disease, smoking history EXAM: PORTABLE CHEST 1 VIEW COMPARISON:  Chest x-ray of 02/01/2016 FINDINGS: The lungs appear clear but hyperaerated. No pneumonia or effusion is seen. Mediastinal and hilar contours are unremarkable. The heart is mildly enlarged and stable. Median sternotomy sutures are present from prior CABG. The bones are osteopenic and there are degenerative changes in the shoulders left-greater-than-right. IMPRESSION: Hyperaeration.  No active lung disease.  Mild cardiomegaly. Electronically Signed   By: Renae Fickle  Gery PrayBarry M.D.   On: 02/13/2016 14:57    EKG: Independently reviewed.   Assessment/Plan Principal Problem:   Elevated troponin Active Problems:   End stage renal disease (HCC)   Hypoglycemia   Hypothermia   Elevated troponin -Troponins 14 upon admission, cardiology consulted. -Cardiology recommending comfort care. Patient is DNR/DNI. -Currently denies any chest pain, cycle cardiac enzymes. She is on her blockers continue.  ESRD -As a matter fact appears dehydrated, she is been not eating or drinking well for about 2 weeks. -Did not drink or eat anything since Saturday apart from sips of protein shake (Boost). -Consult nephrology in a.m.  Hypoglycemia -Daughter at bedside denies any hypoglycemic agents intake, this is likely secondary to failure to thrive/poor oral intake. -This is improved after 1 amp of 50. Present with CBG at 13, CBG currently over 200.  Hypothermia -Present with temperature of 92.4, currently on a Bair hugger, both PMN admitted stepdown. -This is presumed to be secondary to infection and started on vancomycin and Zosyn. -This is could be secondary to starvation and dehydration.  Goals of care -This is discussed with the daughter,  patient is DNR/DNI, started on antibiotics. -Will not start heparin drip for the high troponin, offered to monitor overnight. -I consulted palliative medicine team for goals of care as patient appears to be declining steadily and rapidly.   DVT prophylaxis: SQ Heparin Code Status: DNR Family Communication: Plan D/W patient Disposition Plan: Home Consults called:  Admission status: Lenox AhrStepdown   Anara Cowman A MD [facility] Pager [phone]  If 7PM-7AM, please contact night-coverage [email or web address] Password [email or web address]  02/13/2016, 5:40 PM

## 2016-02-13 NOTE — ED Provider Notes (Signed)
[facility]-EMERGENCY DEPT Provider Note   CSN: [id number] Arrival date & time: 02/13/16  1400     History   Chief Complaint Chief Complaint  Patient presents with  . Abnormal Lab    HPI Courtney [name] is a 82 y.o. female.   Abnormal Lab   Courtney [name] is a 82 y.o. female  with a PMH of ESRD on dialysis MWF, HTN, alzheimer's who presents to the Emergency Department with niece for generalized weakness and decreased PO intake x 1.5 weeks. She has missed her last two dialysis appointments 2/2 this weakness. She has been evaluated in ED and by GI for the same. Niece at bedside states that she saw GI last week where they checked for blood in the stool and it was negative. Hemoccult also done at ED visit on 9/21 which was negative. Family has not noticed any blood in stool. Also denies fever, cough, congestion. Makes no urine 2/2 ESRD. Patient was called by dialysis center today and told her hgb was low which prompted them to come to ED today.   Level V caveat applies 2/2 dementia.    Past Medical History:  Diagnosis Date  . Alzheimer's dementia   . Anemia due to chronic renal failure treated with erythropoietin   . CAD in native artery 1997   CABG 1997, Last stress test 10/09 no ischemia  . Carotid disease, bilateral (HCC)    Most recent Dopplers April 2014: Right carotid 50-69% (lower range);  lleft less than 50%.  . Dyslipidemia   . 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 H/O pulmonary fibrosis   . Hx of sickle cell trait   . Hypertension   . Hypertension, renal disease   . Ischemic cardiomyopathy    EF by echo 11/2010 EF 35-45%, LA is moderately dilated, mod. MR, mod. to severe TR, RV systolic pressure 40-50 mmHg  . Moderate mitral  regurgitation 11/2010  . Myocardial infarction 12-03-1995  . Osteoarthritis   . Peripheral vascular disease (HCC) 03/2008   Stents to rt SFA, Dopplers from April 2014: No significant change, last PV cath 03/29/08  -- fairly widely patent R. SFA stent. Left SFA less than 50%. Proximal but occluded in the mid vessel with reconstitution distally. Right PT and AP occluded as described previously an angiography. Left AP occluded also noted angiographically   . S/P CABG x 5 1997   LIMA-LAD, SVG-OM, Seq SVG-RPDA-RPL  . Shortness of breath    RELATED TO DIALYSIS -TOO MUCH FLUID  . Stroke, lacunar (HCC) 1994  . Vulvar lesion     Patient Active Problem List   Diagnosis Date Noted  . Elevated troponin 02/13/2016  . S/p left hip fracture 10/05/2015  . Hip fracture (HCC) 09/30/2015  . Hypertension 09/30/2015  . Bradycardia 09/30/2015  . Hypokalemia 09/30/2015  . Coronary artery disease involving native coronary artery without angina pectoris 09/21/2015  . ESRD on dialysis (HCC) 09/12/2015  . CAD (coronary artery disease) of artery bypass graft 07/24/2013  . Dyslipidemia 07/24/2013  . Hypertension, renal disease   . Carotid disease, bilateral (HCC)   . Ischemic cardiomyopathy   . Mechanical complication of other vascular device, implant, and graft 11/24/2012  . Leg pain 07/11/2011  .  Other complications due to renal dialysis device, implant, and graft 07/11/2011  . End stage renal disease (HCC) 07/11/2011  . VIN III (vulvar intraepithelial neoplasia III) 05/22/2011  . Carcinoma in situ of breast and genitourinary system 04/11/2011  . Kidney disease 03/12/2011  . Peripheral vascular disease (HCC) 03/13/2008    Past Surgical History:  Procedure Laterality Date  . ABDOMINAL AORTAGRAM N/A 07/18/2011   Procedure: ABDOMINAL Ronny FlurryAORTAGRAM;  Surgeon: Fransisco HertzBrian L Chen, MD;  [facility];  Service: Cardiovascular;  Laterality: N/A;  . AV FISTULA PLACEMENT Right 2001   AVF  . CHOLECYSTECTOMY    .  CORONARY ARTERY BYPASS GRAFT  1997   LIMA-LAD; seq VG-PDA&PLA; VG-OM  . FEMORAL ARTERY STENT Right 03/2008   6 mm X 150 mm + 6 mm x 100 mm SMART stent;;   . INTRAMEDULLARY (IM) NAIL INTERTROCHANTERIC Left 10/01/2015   Procedure: INTRAMEDULLARY (IM) NAIL INTERTROCHANTRIC;  Surgeon: Sheral Apleyimothy D Murphy, MD;  Location: [facility] OR;  Service: Orthopedics;  Laterality: Left;  . LIGATION OF ARTERIOVENOUS  FISTULA Right 09/21/2015   Procedure: LIGATION OF RIGHT UPPER ARM ARTERIOVENOUS  FISTULA;  Surgeon: Pryor OchoaJames D Lawson, MD;  [facility];  Service: Vascular;  Laterality: Right;  . Moles      from thigh s removed.  Marland Kitchen. SHUNTOGRAM N/A 09/05/2011   Procedure: Betsey AmenSHUNTOGRAM;  Surgeon: Fransisco HertzBrian L Chen, MD;  [facility];  Service: Cardiovascular;  Laterality: N/A;  . VULVAR LESION REMOVAL  04/23/2011   Procedure: VULVAR LESION;  Surgeon: Laurette SchimkeWendy Brewster, MD;  Location: [facility] ORS;  Service: Gynecology;  Laterality: N/A;  Wide Local Excision Of Vulvar Lesion     OB History    No data available       Home Medications    Prior to Admission medications   Medication Sig Start Date End Date Taking? Authorizing Provider  acetaminophen (TYLENOL) 325 MG tablet Take 325-650 mg by mouth every 6 (six) hours as needed (for pain). Reported on 11/30/2015   Yes Historical Provider, MD  atorvastatin (LIPITOR) 80 MG tablet Take 1 tablet (80 mg total) by mouth daily. 09/22/15  Yes Marykay Lexavid W Harding, MD  carvedilol (COREG) 12.5 MG tablet Take 1 tablet (12.5 mg total) by mouth 2 (two) times daily with a meal. 09/22/15  Yes Marykay Lexavid W Harding, MD  clopidogrel (PLAVIX) 75 MG tablet TAKE ONE TABLET BY MOUTH ONCE DAILY Patient taking differently: Take 75 mg by mouth once a day 12/08/15  Yes Marykay Lexavid W Harding, MD  latanoprost (XALATAN) 0.005 % ophthalmic solution Place 1 drop into both eyes at bedtime.    Yes Historical Provider, MD  Nutritional Supplements (FEEDING SUPPLEMENT, NEPRO CARB STEADY,) LIQD Take 237 mLs by mouth 2 (two) times daily  between meals. 10/03/15  Yes Alison MurrayAlma M Devine, MD  senna-docusate (SENOKOT-S) 8.6-50 MG tablet Take 1 tablet by mouth at bedtime as needed for mild constipation. 10/03/15  Yes Alison MurrayAlma M Devine, MD    Family History Family History  Problem Relation Age of Onset  . Heart disease Mother   . Sudden death Mother 5157  . Sudden death Father 5648  . Stroke Father   . Sudden death Brother 7149  . Heart disease Brother   . Heart disease Sister   . Sudden death Sister 6942  . Colon cancer Neg Hx   . Anesthesia problems Neg Hx     Social History Social History  Substance Use Topics  . Smoking status: Former Smoker    Types: Cigarettes  Quit date: 11/01/1991  . Smokeless tobacco: Never Used  . Alcohol use No     Allergies   Aspirin and Pletal [cilostazol]   Review of Systems Review of Systems  Unable to perform ROS: Dementia  Constitutional: Negative for fever.  Respiratory: Negative for cough.   Gastrointestinal: Negative for blood in stool.     Physical Exam Updated Vital Signs BP 98/61   Pulse (!) 49   Temp (!) 92.4 F (33.6 C) (Rectal)   Resp 17   Ht 5\' 3"  (1.6 m)   Wt 31.8 kg   SpO2 100%   BMI 12.40 kg/m   Physical Exam  Constitutional: She appears well-developed and well-nourished. No distress.  Very thin elderly female.   HENT:  Head: Normocephalic and atraumatic.  Cardiovascular: Regular rhythm and normal heart sounds.   No murmur heard. Bradycardic.  Pulmonary/Chest: Effort normal and breath sounds normal. No respiratory distress. She has no wheezes. She has no rales.  Abdominal: Soft. She exhibits no distension. There is no tenderness.  Musculoskeletal: She exhibits no edema.  Neurological:  Alert to self only. Able to follow simple one step commands.   Skin: Skin is warm and dry.  Nursing note and vitals reviewed.    ED Treatments / Results  Labs (all labs ordered are listed, but only abnormal results are displayed) Labs Reviewed  BASIC METABOLIC PANEL -  Abnormal; Notable for the following:       Result Value   Potassium 3.2 (*)    Glucose, Bld 266 (*)    BUN 37 (*)    Creatinine, Ser 4.97 (*)    Calcium 7.8 (*)    GFR calc non Af Amer 7 (*)    GFR calc Af Amer 8 (*)    All other components within normal limits  CBC WITH DIFFERENTIAL/PLATELET - Abnormal; Notable for the following:    WBC 10.6 (*)    RBC 3.00 (*)    Hemoglobin 7.7 (*)    HCT 23.7 (*)    MCH 25.7 (*)    RDW 17.6 (*)    Platelets 83 (*)    Neutro Abs 9.7 (*)    Lymphs Abs 0.5 (*)    All other components within normal limits  I-STAT CHEM 8, ED - Abnormal; Notable for the following:    BUN 40 (*)    Creatinine, Ser 4.90 (*)    Calcium, Ion 1.03 (*)    Hemoglobin 8.8 (*)    HCT 26.0 (*)    All other components within normal limits  I-STAT TROPOININ, ED - Abnormal; Notable for the following:    Troponin i, poc 14.21 (*)    All other components within normal limits  I-STAT CG4 LACTIC ACID, ED - Abnormal; Notable for the following:    Lactic Acid, Venous 2.66 (*)    All other components within normal limits  CBG MONITORING, ED - Abnormal; Notable for the following:    Glucose-Capillary 13 (*)    All other components within normal limits  CBG MONITORING, ED - Abnormal; Notable for the following:    Glucose-Capillary 200 (*)    All other components within normal limits  CBG MONITORING, ED - Abnormal; Notable for the following:    Glucose-Capillary 217 (*)    All other components within normal limits  CULTURE, BLOOD (ROUTINE X 2)  CULTURE, BLOOD (ROUTINE X 2)  TYPE AND SCREEN    EKG  EKG Interpretation  Date/Time:  Tuesday February 13 2016 15:20:57  EDT Ventricular Rate:  50 PR Interval:    QRS Duration: 127 QT Interval:  545 QTC Calculation: 498 R Axis:   94 Text Interpretation:  Sinus rhythm Consider left ventricular hypertrophy Borderline prolonged QT interval No significant change since last tracing Confirmed by Anitra Lauth  MD, Alphonzo Lemmings ([id number]) on  02/13/2016 3:48:32 PM       Radiology Dg Chest Portable 1 View  Result Date: 02/13/2016 CLINICAL DATA:  Weakness, coronary artery disease, smoking history EXAM: PORTABLE CHEST 1 VIEW COMPARISON:  Chest x-ray of 02/01/2016 FINDINGS: The lungs appear clear but hyperaerated. No pneumonia or effusion is seen. Mediastinal and hilar contours are unremarkable. The heart is mildly enlarged and stable. Median sternotomy sutures are present from prior CABG. The bones are osteopenic and there are degenerative changes in the shoulders left-greater-than-right. IMPRESSION: Hyperaeration.  No active lung disease.  Mild cardiomegaly. Electronically Signed   By: Dwyane Dee M.D.   On: 02/13/2016 14:57    Procedures Procedures (including critical care time)  Medications Ordered in ED Medications  dextrose 50 % solution 50 mL (50 mLs Intravenous Given 02/13/16 1516)  sodium chloride 0.9 % bolus 500 mL (500 mLs Intravenous New Bag/Given 02/13/16 1553)     Initial Impression / Assessment and Plan / ED Course  I have reviewed the triage vital signs and the nursing notes.  Pertinent labs & imaging results that were available during my care of the patient were reviewed by me and considered in my medical decision making (see chart for details).  Clinical Course   Courtney [name] is a 82 y.o. female with hx of ESRD on dialysis MWF (missed last two dialysis appointments) who presents to ED today for generalized weakness and decreased PO intake. Temp of 92.4- bear hugger placed. CBG upon arrival was 13. D50 given and CBG improved. Patient much more alert and talkative with sugar improved. Troponin of 14.21, EKG with no significant change from previous: cardiology consulted who recommends medical admission and will see patient in consult. Lactic elevated at 2.66. 500 IV fluid bolus given. BMP with K+ of 3.2, BUN/Cr elevated but baseline. Consulted hospitalist who will admit.    Patient seen by and discussed with  Dr. Anitra Lauth who agrees with treatment plan.   Final Clinical Impressions(s) / ED Diagnoses   Final diagnoses:  None    New Prescriptions New Prescriptions   No medications on file     [facility], PA-C 02/13/16 1646    Gwyneth Sprout, MD 02/13/16 2210

## 2016-02-13 NOTE — ED Notes (Signed)
CBG 196; RN notified regarding CBG and temperature

## 2016-02-13 NOTE — Progress Notes (Signed)
Pharmacy Antibiotic Note  Courtney [name] is a 82 y.o. female admitted on 02/13/2016 with sepsis.  Pharmacy has been consulted for vancomycin and zosyn dosing.  Plan: F/U GOC Vancomycin 750 mg x 1 Zosyn 3.375 gm iv q12h Monitor nephro consult, cx, vr prn  Height: 5\' 3"  (160 cm) Weight: 70 lb (31.8 kg) IBW/kg (Calculated) : 52.4  Temp (24hrs), Avg:94.2 F (34.6 C), Min:92.4 F (33.6 C), Max:96.9 F (36.1 C)   Recent Labs Lab 02/13/16 1523 02/13/16 1536 02/13/16 1602  WBC 10.6*  --   --   CREATININE 4.97*  --  4.90*  LATICACIDVEN  --  2.66*  --     Estimated Creatinine Clearance: 4.4 mL/min (by C-G formula based on SCr of 4.9 mg/dL (H)).    Allergies  Allergen Reactions  . Aspirin Itching  . Pletal [Cilostazol] Other (See Comments)    DIZZINESS    Antimicrobials this admission:  Zosyn 10/3>> Vancomycin 10/3>>  Dose adjustments this admission:  N/A  Microbiology results:  10/3 Blood x 2 MRSA PCR  Isaac BlissMichael Coralynn Gaona, PharmD, BCPS, [facility]CC[facility] Clinical Pharmacist Pager [phone] 02/13/2016 6:53 PM

## 2016-02-13 NOTE — ED Notes (Signed)
Dr. Anitra LauthPlunkett removed pt from NRB.

## 2016-02-13 NOTE — ED Notes (Signed)
CBG 217  

## 2016-02-13 NOTE — ED Notes (Signed)
CBG 200 

## 2016-02-13 NOTE — ED Notes (Signed)
I notified Plunkett, MD of POCT CBG resulting 18 (performed by Hannie, EMT); Vance PeperLynnze, RN present in room

## 2016-02-13 NOTE — ED Notes (Signed)
Dr. Plunkett at bedside.  

## 2016-02-13 NOTE — ED Notes (Signed)
Provider at bedside

## 2016-02-13 NOTE — Consult Note (Signed)
Cardiology Consult    Patient ID: Courtney [name] MRN: [id number], DOB/AGE: [birth year]   Admit date: 02/13/2016 Date of Consult: 02/13/2016  Primary Physician: Oneal Grout, MD Reason for Consult: Elevated troponin Primary Cardiologist: [name]  Requesting Provider: Dr. Madlyn Frankel  Patient Profile    Courtney [name] is a 82 year old female with a past medical history of CAD s/p CABG in 1997, dementia, dyslipidemia, ESRD on HD,and ischemic cardiomyopathy. She presented to the ED on 02/13/16 via EMS for generalized weakness and family reported that the dialysis center told her that patient had a drop in her hemoglobin. Her blood sugar at the time of arrival was 13, and her troponin was elevated at 14. Cardiology was consulted for elevated troponin.  History of Present Illness    Courtney [name] is a frail 82 year old female with advanced dementia. She lives at home with her daughter Elnita Maxwell who takes care of her. The history is provided by the patient's daughter.  Courtney [name] did not have a dialysis treatment on Friday because she was weak and not feeling well. She has not had anything to eat or drink except for a few sips of soda since Thursday 02/08/16. Her daughter reports that the patient has gradually declined over the past 2 weeks. Patient was able to walk throughout the house with help up until a few weeks ago.   Patient is confused at baseline, she is confused at the time of my encounter. She had a non sustained run of VT in the ED. Per Dr. Elissa Hefty note from her last office visit in May of this year she wishes to be a DNR. Per her family(daughter Elnita Maxwell, and her niece) the patient has expressed many times to them that she would not like CPR, defibrillation or ACLS drugs.   Her temperature was 92.4 on arrival, lactate was 2.66, K 3.2.   Past Medical History   Past Medical History:  Diagnosis Date  . Alzheimer's dementia   . Anemia due to chronic renal failure treated with  erythropoietin   . CAD in native artery 1997   CABG 1997, Last stress test 10/09 no ischemia  . Carotid disease, bilateral (HCC)    Most recent Dopplers April 2014: Right carotid 50-69% (lower range);  lleft less than 50%.  . Dyslipidemia   . 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 H/O pulmonary fibrosis   . Hx of sickle cell trait   . Hypertension   . Hypertension, renal disease   . Ischemic cardiomyopathy    EF by echo 11/2010 EF 35-45%, LA is moderately dilated, mod. MR, mod. to severe TR, RV systolic pressure 40-50 mmHg  . Moderate mitral regurgitation 11/2010  . Myocardial infarction 12-03-1995  . Osteoarthritis   . Peripheral vascular disease (HCC) 03/2008   Stents to rt SFA, Dopplers from April 2014: No significant change, last PV cath 03/29/08  -- fairly widely patent R. SFA stent. Left SFA less than 50%. Proximal but occluded in the mid vessel with reconstitution distally. Right PT and AP occluded as described previously an angiography. Left AP occluded also noted angiographically   . S/P CABG x 5 1997   LIMA-LAD, SVG-OM, Seq SVG-RPDA-RPL  .  Shortness of breath    RELATED TO DIALYSIS -TOO MUCH FLUID  . Stroke, lacunar (HCC) 1994  . Vulvar lesion     Past Surgical History:  Procedure Laterality Date  . ABDOMINAL AORTAGRAM N/A 07/18/2011   Procedure: ABDOMINAL Ronny Flurry;  Surgeon: Fransisco Hertz, MD;  Location: [facility] CATH LAB;  Service: Cardiovascular;  Laterality: N/A;  . AV FISTULA PLACEMENT Right 2001   AVF  . CHOLECYSTECTOMY    . CORONARY ARTERY BYPASS GRAFT  1997   LIMA-LAD; seq VG-PDA&PLA; VG-OM  . FEMORAL ARTERY STENT Right 03/2008   6 mm X 150 mm + 6 mm x 100 mm SMART stent;;   . INTRAMEDULLARY (IM) NAIL INTERTROCHANTERIC Left 10/01/2015     Procedure: INTRAMEDULLARY (IM) NAIL INTERTROCHANTRIC;  Surgeon: Sheral Apley, MD;  Location: [facility] OR;  Service: Orthopedics;  Laterality: Left;  . LIGATION OF ARTERIOVENOUS  FISTULA Right 09/21/2015   Procedure: LIGATION OF RIGHT UPPER ARM ARTERIOVENOUS  FISTULA;  Surgeon: Pryor Ochoa, MD;  Location: [facility] OR;  Service: Vascular;  Laterality: Right;  . Moles      from thigh s removed.  Marland Kitchen SHUNTOGRAM N/A 09/05/2011   Procedure: Betsey Amen;  Surgeon: Fransisco Hertz, MD;  Location: [facility] CATH LAB;  Service: Cardiovascular;  Laterality: N/A;  . VULVAR LESION REMOVAL  04/23/2011   Procedure: VULVAR LESION;  Surgeon: Laurette Schimke, MD;  Location: [facility] ORS;  Service: Gynecology;  Laterality: N/A;  Wide Local Excision Of Vulvar Lesion      Allergies  Allergies  Allergen Reactions  . Aspirin Itching  . Pletal [Cilostazol] Other (See Comments)    DIZZINESS    Inpatient Medications      Family History    Family History  Problem Relation Age of Onset  . Heart disease Mother   . Sudden death Mother 66  . Sudden death Father 15  . Stroke Father   . Sudden death Brother 5  . Heart disease Brother   . Heart disease Sister   . Sudden death Sister 36  . Colon cancer Neg Hx   . Anesthesia problems Neg Hx     Social History    Social History   Social History  . Marital status: Single    Spouse name: N/A  . Number of children: 1  . Years of education: N/A   Occupational History  . Retired-dietary     from [employer]   Social History Main Topics  . Smoking status: Former Smoker    Types: Cigarettes    Quit date: 11/01/1991  . Smokeless tobacco: Never Used  . Alcohol use No  . Drug use: No  . Sexual activity: No   Other Topics Concern  . Not on file   Social History Narrative   Single mother of one, grandmother of one. She is not exercising as much as he used to. She used to dance and a lot of walking. But of late she has not been as much. She states that when she turned 47  she became lazy.   She does not smoke and does not drink.   Again we discussed her goals of care and desires as far as resuscitation go. She continues to state that she wants to be DO NOT RESUSCITATE DO NOT INTUBATE, but does not have the form to state that. She also is not interested in invasive evaluation unless directed by symptoms. With that in mind, she is not in favor of continued noninvasive evaluation.  Review of Systems    General:  No chills, fever, night sweats or weight changes.  Cardiovascular:  No chest pain, dyspnea on exertion, edema, orthopnea, palpitations, paroxysmal nocturnal dyspnea. Dermatological: No rash, lesions/masses Respiratory: No cough, dyspnea Urologic: No hematuria, dysuria Abdominal:   No nausea, vomiting, diarrhea, bright red blood per rectum, melena, or hematemesis Neurologic:  No visual changes, wkns, changes in mental status. All other systems reviewed and are otherwise negative except as noted above.  Physical Exam    Blood pressure 98/61, pulse (!) 49, temperature (!) 93.4 F (34.1 C), temperature source Rectal, resp. rate 17, height 5\' 3"  (1.6 m), weight 70 lb (31.8 kg), SpO2 100 %.  General: lethargic, elderly african [place] female  Psych: Confused, disoriented x 4. Neuro:  Moves all extremities spontaneously. HEENT: Poor dentition.   Neck: Supple without bruits or JVD. Lungs:  Resp regular and unlabored, CTA. Heart: RRR no s3, s4, or murmurs. Abdomen: Soft, non-tender, non-distended, BS + x 4.  Extremities: No clubbing, cyanosis or edema. DP/PT/Radials 2+ and equal bilaterally.  Labs    Troponin [facility] Care Test)  Recent Labs  02/13/16 1533  TROPIPOC 14.21*    Lab Results  Component Value Date   WBC 10.6 (H) 02/13/2016   HGB 8.8 (L) 02/13/2016   HCT 26.0 (L) 02/13/2016   MCV 79.0 02/13/2016   PLT 83 (L) 02/13/2016    Recent Labs Lab 02/13/16 1523 02/13/16 1602  NA 142 141  K 3.2* 3.5  CL 103 101  CO2 26  --     BUN 37* 40*  CREATININE 4.97* 4.90*  CALCIUM 7.8*  --   GLUCOSE 266* 90    Radiology Studies    Ct Abdomen Pelvis Wo Contrast  Result Date: 02/01/2016 CLINICAL DATA:  Failure to thrive. EXAM: CT ABDOMEN AND PELVIS WITHOUT CONTRAST TECHNIQUE: Multidetector CT imaging of the abdomen and pelvis was performed following the standard protocol without IV contrast. COMPARISON:  None. FINDINGS: Lower chest: Normal heart size. No pericardial effusion. Dense coronary artery calcifications and aortic calcifications. Hepatobiliary: Small hepatic cysts. No worrisome hepatic lesions. The gallbladder is surgically absent. Mild associated common bile duct dilatation. Pancreas: The pancreas appears low-attenuation and somewhat prominent for the patient's age. I do not see any obvious surrounding inflammation to suggest acute pancreatitis but recommend correlation with lipase and amylase levels. No ductal dilatation. Spleen: Normal size.  No focal lesions. Adrenals/Urinary Tract: Difficult to identified the adrenal glands. No obvious mass. There are numerous large bilateral renal cysts. Both kidneys are small and there are extensive renal artery calcifications. No hydronephrosis. Stomach/Bowel: The stomach, duodenum, small bowel and colon are grossly normal. No obvious inflammatory changes, mass lesions or obstructive findings. Vascular/Lymphatic: Severe and extensive arterial calcifications involving the aorta and branch vessels. No mesenteric or retroperitoneal mass or adenopathy. Reproductive: The uterus and ovaries are grossly normal. Calcified fibroids are noted. Other: Mild mesenteric edema and small amount of free pelvic fluid. No pelvic mass or adenopathy. No inguinal mass or adenopathy. Musculoskeletal: No significant bony findings. Surgical hardware noted in the left hip. IMPRESSION: 1. Severe and extensive atherosclerosis. 2. Numerous renal cysts and small atrophied kidneys with extensive renal artery  calcifications. 3. The pancreas appears somewhat prominent and low-attenuation. No obvious peripancreatic inflammation but recommend correlation with lipase and amylase levels to exclude pancreatitis. 4. No findings for bowel obstruction. Electronically Signed   By: Rudie Meyer M.D.   On: 02/01/2016 23:41   Dg Chest 2 View  Result  Date: 02/01/2016 CLINICAL DATA:  Fatigue and hypotension. EXAM: CHEST  2 VIEW COMPARISON:  09/30/2015 FINDINGS: The cardiac silhouette, mediastinal and hilar contours are within normal limits and stable. Stable surgical changes from bypass surgery. Stable tortuosity and calcification of the thoracic aorta. Advanced emphysematous changes are again demonstrated with areas of pulmonary scarring. No acute overlying pulmonary process. Artifact noted from overlying EKG leads. The bony thorax is intact. Remote mid thoracic compression fracture. IMPRESSION: Emphysematous changes and pulmonary scarring but no acute overlying pulmonary process. Electronically Signed   By: Rudie MeyerP.  Gallerani M.D.   On: 02/01/2016 21:02   Dg Chest Portable 1 View  Result Date: 02/13/2016 CLINICAL DATA:  Weakness, coronary artery disease, smoking history EXAM: PORTABLE CHEST 1 VIEW COMPARISON:  Chest x-ray of 02/01/2016 FINDINGS: The lungs appear clear but hyperaerated. No pneumonia or effusion is seen. Mediastinal and hilar contours are unremarkable. The heart is mildly enlarged and stable. Median sternotomy sutures are present from prior CABG. The bones are osteopenic and there are degenerative changes in the shoulders left-greater-than-right. IMPRESSION: Hyperaeration.  No active lung disease.  Mild cardiomegaly. Electronically Signed   By: Dwyane DeePaul  Barry M.D.   On: 02/13/2016 14:57    EKG & Cardiac Imaging    EKG: Sinus bradycardia, LVH  Echocardiogram: lat 10/02/15 Study Conclusions  - Left ventricle: The cavity size was normal. Wall thickness was   normal. Systolic function was normal. The estimated  ejection   fraction was in the range of 55% to 60%. Doppler parameters are   consistent with abnormal left ventricular relaxation (grade 1   diastolic dysfunction). Doppler parameters are consistent with   high ventricular filling pressure. - Aortic valve: There was trivial regurgitation. - Mitral valve: There was mild regurgitation. - Left atrium: The atrium was mildly dilated. - Tricuspid valve: There was moderate regurgitation. - Pulmonary arteries: PA peak pressure: 57 mm Hg (S). - Pericardium, extracardiac: A trivial pericardial effusion was   identified.  Assessment & Plan  1. Elevated troponin: patient without acute EKG changes, also with ESRD and has missed dialysis. She appears to be actively dying, has had gradual decline over the past 2 weeks and is no longer eating or drinking. No plans for invasive cardiac work up as elevated troponin is not representative of ACS and patient's family does not wish for any invasive procedures to be done.   Would recommend comfort care.   2. History of CAD s/p CABG  3. Non sustained VT: Had a short run of VT. Patient's family was clear that the patient wished to be a DNR. This was consistent with Dr. Elissa HeftyHarding's office note as well. Put in order for DNR.   Signed, Little IshikawaErin E Smith, NP 02/13/2016, 4:53 PM Pager: [phone]  Patient seen, examined. Available data reviewed. Agree with findings, assessment, and plan as outlined by Suzzette RighterErin Smith, NP. The patient is independently interviewed and examined. She is elderly, cachectic, in NAD. Very confused. Keeps telling me to 'behave myself.'JVP normal, lungs clear, heart distant and regular, abdomen soft and NT, extremities thin, cachectic.   Data reviewed - troponin 14.2 mg/dL. She is hypotensive with SBP 76 mmHg, but awake and responsive. EKG without acute STEMI. Pt with advanced dementia, severe protein calorie malnutrition, and ESRD. She clearly should be treated in a palliative fashion and her daughter  and granddaughter at the bedside both agree this is best. Would not treat her with aggressive medical therapy and clearly not a candidate for invasive cardiac evaluation. Rather would focus  on her comfort and involve palliative care as needed. No further cardiac recommendations at this time.   Tonny Bollman, M.D. 02/13/2016 5:46 PM

## 2016-02-13 NOTE — ED Triage Notes (Signed)
Per [facility]: Pt called out for "generalized weakness since Friday". Pt had dialysis Friday. Pt missed her appointment on Monday since she did not feel well. The pts family spoke with the dialysis center today whom stated that the pt "has low hemoglobin and needs to go to the hospital". The dialysis center did not say what the hemoglobin was.

## 2016-02-13 NOTE — ED Notes (Addendum)
CBG 13; RN, MD notifed

## 2016-02-13 NOTE — ED Notes (Signed)
Jamie PA at bedside.   

## 2016-02-13 NOTE — ED Notes (Signed)
Bair Hugger applied to patient; visitor at bedside

## 2016-02-14 DIAGNOSIS — R748 Abnormal levels of other serum enzymes: Secondary | ICD-10-CM | POA: Diagnosis not present

## 2016-02-14 DIAGNOSIS — I214 Non-ST elevation (NSTEMI) myocardial infarction: Secondary | ICD-10-CM | POA: Diagnosis not present

## 2016-02-14 DIAGNOSIS — Z7189 Other specified counseling: Secondary | ICD-10-CM

## 2016-02-14 DIAGNOSIS — Z515 Encounter for palliative care: Secondary | ICD-10-CM | POA: Diagnosis not present

## 2016-02-14 DIAGNOSIS — F0390 Unspecified dementia without behavioral disturbance: Secondary | ICD-10-CM | POA: Diagnosis not present

## 2016-02-14 DIAGNOSIS — N186 End stage renal disease: Secondary | ICD-10-CM | POA: Diagnosis not present

## 2016-02-14 DIAGNOSIS — T68XXXA Hypothermia, initial encounter: Secondary | ICD-10-CM | POA: Diagnosis not present

## 2016-02-14 LAB — BASIC METABOLIC PANEL
ANION GAP: 13 (ref 5–15)
BUN: 40 mg/dL — ABNORMAL HIGH (ref 6–20)
CALCIUM: 8.1 mg/dL — AB (ref 8.9–10.3)
CHLORIDE: 100 mmol/L — AB (ref 101–111)
CO2: 26 mmol/L (ref 22–32)
CREATININE: 5.13 mg/dL — AB (ref 0.44–1.00)
GFR calc non Af Amer: 7 mL/min — ABNORMAL LOW (ref >60)
GFR, EST AFRICAN AMERICAN: 8 mL/min — AB (ref >60)
Glucose, Bld: 116 mg/dL — ABNORMAL HIGH (ref 65–99)
Potassium: 2.6 mmol/L — CL (ref 3.5–5.1)
SODIUM: 139 mmol/L (ref 135–145)

## 2016-02-14 LAB — CBC
HCT: 22 % — ABNORMAL LOW (ref 36.0–46.0)
Hemoglobin: 7.3 g/dL — ABNORMAL LOW (ref 12.0–15.0)
MCH: 26.3 pg (ref 26.0–34.0)
MCHC: 33.2 g/dL (ref 30.0–36.0)
MCV: 79.1 fL (ref 78.0–100.0)
PLATELETS: 78 10*3/uL — AB (ref 150–400)
RBC: 2.78 MIL/uL — AB (ref 3.87–5.11)
RDW: 17.4 % — ABNORMAL HIGH (ref 11.5–15.5)
WBC: 11.4 10*3/uL — AB (ref 4.0–10.5)

## 2016-02-14 LAB — PREPARE RBC (CROSSMATCH)

## 2016-02-14 LAB — GLUCOSE, CAPILLARY
GLUCOSE-CAPILLARY: 133 mg/dL — AB (ref 65–99)
GLUCOSE-CAPILLARY: 107 mg/dL — AB (ref 65–99)
GLUCOSE-CAPILLARY: 131 mg/dL — AB (ref 65–99)
Glucose-Capillary: 117 mg/dL — ABNORMAL HIGH (ref 65–99)

## 2016-02-14 LAB — LACTIC ACID, PLASMA: LACTIC ACID, VENOUS: 2 mmol/L — AB (ref 0.5–1.9)

## 2016-02-14 LAB — TROPONIN I: TROPONIN I: 12.83 ng/mL — AB (ref <0.03)

## 2016-02-14 MED ORDER — POLYVINYL ALCOHOL 1.4 % OP SOLN
1.0000 [drp] | Freq: Four times a day (QID) | OPHTHALMIC | Status: DC | PRN
  Filled 2016-02-14: qty 15

## 2016-02-14 MED ORDER — POTASSIUM CHLORIDE 10 MEQ/100ML IV SOLN
10.0000 meq | INTRAVENOUS | Status: DC
Start: 2016-02-14 — End: 2016-02-14

## 2016-02-14 MED ORDER — ONDANSETRON HCL 4 MG/2ML IJ SOLN: 4.0000 mg | Freq: Four times a day (QID) | INTRAMUSCULAR | Status: DC | PRN

## 2016-02-14 MED ORDER — OXYCODONE HCL 20 MG/ML PO CONC: 2.5000 mg | ORAL | Status: DC | PRN

## 2016-02-14 MED ORDER — HALOPERIDOL 0.5 MG PO TABS
0.5000 mg | ORAL_TABLET | ORAL | Status: DC | PRN
  Filled 2016-02-14: qty 1

## 2016-02-14 MED ORDER — HALOPERIDOL LACTATE 5 MG/ML IJ SOLN: 0.5000 mg | INTRAMUSCULAR | Status: DC | PRN

## 2016-02-14 MED ORDER — GLYCOPYRROLATE 1 MG PO TABS
1.0000 mg | ORAL_TABLET | ORAL | Status: DC | PRN
  Filled 2016-02-14: qty 1

## 2016-02-14 MED ORDER — GLYCOPYRROLATE 0.2 MG/ML IJ SOLN
0.2000 mg | INTRAMUSCULAR | Status: DC | PRN
  Filled 2016-02-14: qty 1

## 2016-02-14 MED ORDER — HALOPERIDOL LACTATE 2 MG/ML PO CONC
0.5000 mg | ORAL | Status: DC | PRN
  Filled 2016-02-14: qty 0.3

## 2016-02-14 MED ORDER — POTASSIUM CHLORIDE 10 MEQ/100ML IV SOLN
10.0000 meq | INTRAVENOUS | Status: AC
  Administered 2016-02-14 (×4): 10 meq via INTRAVENOUS
  Filled 2016-02-14 (×4): qty 100

## 2016-02-14 MED ORDER — OXYCODONE HCL 20 MG/ML PO CONC: 2.5000 mg | ORAL | 0 refills | Status: AC | PRN

## 2016-02-14 MED ORDER — SODIUM CHLORIDE 0.9 % IV SOLN: Freq: Once | INTRAVENOUS | Status: DC

## 2016-02-14 MED ORDER — ONDANSETRON 4 MG PO TBDP
4.0000 mg | ORAL_TABLET | Freq: Four times a day (QID) | ORAL | Status: DC | PRN
  Filled 2016-02-14: qty 1

## 2016-02-14 MED ORDER — HALOPERIDOL 0.5 MG PO TABS: 0.5000 mg | ORAL_TABLET | ORAL | 0 refills | Status: AC | PRN

## 2016-02-14 MED ORDER — BIOTENE DRY MOUTH MT LIQD: 15.0000 mL | OROMUCOSAL | Status: DC | PRN

## 2016-02-14 MED ORDER — GLYCOPYRROLATE 1 MG PO TABS: 1.0000 mg | ORAL_TABLET | ORAL | 0 refills | Status: AC | PRN

## 2016-02-14 NOTE — Consult Note (Addendum)
[name] Renal Consultation Note    Indication for Consultation:  Management of ESRD/hemodialysis; anemia, hypertension/volume and secondary hyperparathyroidism PCP:  HPI: Courtney [name] is a 82 y.o. female with ESRD on hemodialysis since 2009. She has hemodialysis MWF at [facility]. PMH significant for acquired cystic kidney disease, HTN, ischemic cardiomyopathyCAD, CABG 1997, alzheimer's disease, bilateral carotid disease, CVA, anemia of chronic disease, SHPT. S/P fall, fx L hip- ORIF 5/21 Dr. Percell Miller.  Patient is confused, unable to assist with history. Information gathered from niece and EMR. Niece reports 2 week decline in function, poor appetite, weakness, inability to walk. She missed HD 02/12/16 D/T weakness and inability to go to HD clinic. She was brought to ED for evaluation 02/13/16.  On arrival to ED, temperature was 92.4 BP 91/49 HR 49 O2 sats 75% on RA.  BS 13 Na 142 K+ 3.2 Hgb 7.7 WBC 10.6 Lactic acid 2.66 Troponin 14.2. EKG showed SB borderline LVH. CXR showed mild cardiomegaly, no acute processes.  She was given D50w in ED. She has short run of nonsustained VT in ED.  Cardiology was consulted D/T elevated troponin. Cardiology recommended comfort care/no further work up. She has been made a DNR. Marland Kitchen She has been started on Vanc/Zosyn per primary for possible sepsis. BC pending. We have been asked to manage hemodialysis.   Currently, she is pleasant confused, has been awake all night per report from niece. Oriented to self only. She has no C/Os, denies chest pain, SOB, abdominal pain, nausea, vomiting diarrhea. Niece reports that she is still not eating. She appears comfortable at present.    Past Medical History:  Diagnosis Date  . Alzheimer's dementia   . Anemia due to chronic renal failure treated with erythropoietin   . CAD in native artery 1997   CABG 1997, Last stress test 10/09 no ischemia  . Carotid disease, bilateral ([place])    Most  recent Dopplers April 2014: Right carotid 50-69% (lower range);  lleft less than 50%.  . Dyslipidemia   . ESRD (end stage renal disease) ([place]) On dialysis for ~5 years   [facility] m w f    using ffistula in left upper arm--also has functioning  fisutla in rt upper arm--which has aneurysm and not being used--pt states blood draws from rt hand and b/p's in legs  . H/O pulmonary fibrosis   . Hx of sickle cell trait   . Hypertension   . Hypertension, renal disease   . Ischemic cardiomyopathy    EF by echo 11/2010 EF 35-45%, LA is moderately dilated, mod. MR, mod. to severe TR, RV systolic pressure 94-85 mmHg  . Moderate mitral regurgitation 11/2010  . Myocardial infarction 12-03-1995  . Osteoarthritis   . Peripheral vascular disease ([place]) 03/2008   Stents to rt SFA, Dopplers from April 2014: No significant change, last PV cath 03/29/08  -- fairly widely patent R. SFA stent. Left SFA less than 50%. Proximal but occluded in the mid vessel with reconstitution distally. Right PT and AP occluded as described previously an angiography. Left AP occluded also noted angiographically   . S/P CABG x 5 1997   LIMA-LAD, SVG-OM, Seq SVG-RPDA-RPL  . Shortness of breath    RELATED TO DIALYSIS -TOO MUCH FLUID  . Stroke, lacunar ([place]) 1994  . Vulvar lesion    Past Surgical History:  Procedure Laterality Date  . ABDOMINAL AORTAGRAM N/A 07/18/2011   Procedure: ABDOMINAL Maxcine Ham;  Surgeon: Conrad Wales, MD;  Location:  [place] CATH LAB;  Service: Cardiovascular;  Laterality: N/A;  . AV FISTULA PLACEMENT Right 2001   AVF  . CHOLECYSTECTOMY    . CORONARY ARTERY BYPASS GRAFT  1997   LIMA-LAD; seq VG-PDA&PLA; VG-OM  . FEMORAL ARTERY STENT Right 03/2008   6 mm X 150 mm + 6 mm x 100 mm SMART stent;;   . INTRAMEDULLARY (IM) NAIL INTERTROCHANTERIC Left 10/01/2015   Procedure: INTRAMEDULLARY (IM) NAIL INTERTROCHANTRIC;  Surgeon: Renette Butters, MD;  Location: [place];  Service: Orthopedics;  Laterality:  Left;  . LIGATION OF ARTERIOVENOUS  FISTULA Right 09/21/2015   Procedure: LIGATION OF RIGHT UPPER ARM ARTERIOVENOUS  FISTULA;  Surgeon: Mal Misty, MD;  Location: [place];  Service: Vascular;  Laterality: Right;  . Moles      from thigh s removed.  Marland Kitchen SHUNTOGRAM N/A 09/05/2011   Procedure: Earney Mallet;  Surgeon: Conrad [place], MD;  Location: [facility] CATH LAB;  Service: Cardiovascular;  Laterality: N/A;  . VULVAR LESION REMOVAL  04/23/2011   Procedure: VULVAR LESION;  Surgeon: Janie Morning, MD;  Location: [facility] ORS;  Service: Gynecology;  Laterality: N/A;  Wide Local Excision Of Vulvar Lesion    Family History  Problem Relation Age of Onset  . Heart disease Mother   . Sudden death Mother 45  . Sudden death Father 27  . Stroke Father   . Sudden death Brother 71  . Heart disease Brother   . Heart disease Sister   . Sudden death Sister 41  . Colon cancer Neg Hx   . Anesthesia problems Neg Hx    Social History:  reports that she quit smoking about 24 years ago. Her smoking use included Cigarettes. She has never used smokeless tobacco. She reports that she does not drink alcohol or use drugs. Allergies  Allergen Reactions  . Aspirin Itching  . Pletal [Cilostazol] Other (See Comments)    DIZZINESS   Prior to Admission medications   Medication Sig Start Date End Date Taking? Authorizing Provider  acetaminophen (TYLENOL) 325 MG tablet Take 325-650 mg by mouth every 6 (six) hours as needed (for pain). Reported on 11/30/2015   Yes Historical Provider, MD  atorvastatin (LIPITOR) 80 MG tablet Take 1 tablet (80 mg total) by mouth daily. 09/22/15  Yes Leonie Man, MD  carvedilol (COREG) 12.5 MG tablet Take 1 tablet (12.5 mg total) by mouth 2 (two) times daily with a meal. 09/22/15  Yes Leonie Man, MD  clopidogrel (PLAVIX) 75 MG tablet TAKE ONE TABLET BY MOUTH ONCE DAILY Patient taking differently: Take 75 mg by mouth once a day 12/08/15  Yes Leonie Man, MD  latanoprost (XALATAN) 0.005 %  ophthalmic solution Place 1 drop into both eyes at bedtime.    Yes Historical Provider, MD  Nutritional Supplements (FEEDING SUPPLEMENT, NEPRO CARB STEADY,) LIQD Take 237 mLs by mouth 2 (two) times daily between meals. 10/03/15  Yes Robbie Lis, MD  senna-docusate (SENOKOT-S) 8.6-50 MG tablet Take 1 tablet by mouth at bedtime as needed for mild constipation. 10/03/15  Yes Robbie Lis, MD   Current Facility-Administered Medications  Medication Dose Route Frequency Provider Last Rate Last Dose  . 0.9 %  sodium chloride infusion   Intravenous Continuous Verlee Monte, MD 10 mL/hr at 02/13/16 2205    . acetaminophen (TYLENOL) tablet 650 mg  650 mg Oral Q6H PRN Verlee Monte, MD   650 mg at 02/14/16 0014   Or  . acetaminophen (TYLENOL) suppository 650 mg  650 mg Rectal Q6H PRN Verlee Monte, MD      . atorvastatin (LIPITOR) tablet 80 mg  80 mg Oral Daily Verlee Monte, MD   80 mg at 02/14/16 0836  . carvedilol (COREG) tablet 12.5 mg  12.5 mg Oral BID WC Verlee Monte, MD   12.5 mg at 02/14/16 0836  . clopidogrel (PLAVIX) tablet 75 mg  75 mg Oral Daily Verlee Monte, MD   75 mg at 02/14/16 0836  . feeding supplement (NEPRO CARB STEADY) liquid 237 mL  237 mL Oral BID BM Verlee Monte, MD      . heparin injection 5,000 Units  5,000 Units Subcutaneous Q8H Verlee Monte, MD   5,000 Units at 02/14/16 0629  . HYDROcodone-acetaminophen (NORCO/VICODIN) 5-325 MG per tablet 1-2 tablet  1-2 tablet Oral Q4H PRN Verlee Monte, MD      . latanoprost (XALATAN) 0.005 % ophthalmic solution 1 drop  1 drop Both Eyes QHS Verlee Monte, MD   1 drop at 02/13/16 2235  . ondansetron (ZOFRAN) tablet 4 mg  4 mg Oral Q6H PRN Verlee Monte, MD       Or  . ondansetron (ZOFRAN) injection 4 mg  4 mg Intravenous Q6H PRN Verlee Monte, MD      . piperacillin-tazobactam (ZOSYN) IVPB 3.375 g  3.375 g Intravenous Q12H Wynell Balloon, RPH   3.375 g at 02/14/16 0845  . potassium chloride 10 mEq in 100 mL IVPB  10 mEq Intravenous Q1 Hr x 4  Rhetta Mura Schorr, NP   10 mEq at 02/14/16 0836  . senna-docusate (Senokot-S) tablet 1 tablet  1 tablet Oral QHS PRN Verlee Monte, MD      . sodium chloride flush (NS) 0.9 % injection 3 mL  3 mL Intravenous Q12H Verlee Monte, MD   3 mL at 02/13/16 2130   Labs: Basic Metabolic Panel:  Recent Labs Lab 02/13/16 1523 02/13/16 1602 02/14/16 0401  NA 142 141 139  K 3.2* 3.5 2.6*  CL 103 101 100*  CO2 26  --  26  GLUCOSE 266* 90 116*  BUN 37* 40* 40*  CREATININE 4.97* 4.90* 5.13*  CALCIUM 7.8*  --  8.1*   CBC:  Recent Labs Lab 02/13/16 1523 02/13/16 1602 02/14/16 0401  WBC 10.6*  --  11.4*  NEUTROABS 9.7*  --   --   HGB 7.7* 8.8* 7.3*  HCT 23.7* 26.0* 22.0*  MCV 79.0  --  79.1  PLT 83*  --  78*   CBG:  Recent Labs Lab 02/13/16 1650 02/13/16 1802 02/14/16 0113 02/14/16 0433 02/14/16 0755  GLUCAP 196* 134* 133* 117* 107*   Studies/Results: Dg Chest Portable 1 View  Result Date: 02/13/2016 CLINICAL DATA:  Weakness, coronary artery disease, smoking history EXAM: PORTABLE CHEST 1 VIEW COMPARISON:  Chest x-ray of 02/01/2016 FINDINGS: The lungs appear clear but hyperaerated. No pneumonia or effusion is seen. Mediastinal and hilar contours are unremarkable. The heart is mildly enlarged and stable. Median sternotomy sutures are present from prior CABG. The bones are osteopenic and there are degenerative changes in the shoulders left-greater-than-right. IMPRESSION: Hyperaeration.  No active lung disease.  Mild cardiomegaly. Electronically Signed   By: Ivar Drape M.D.   On: 02/13/2016 14:57    ROS: As per HPI otherwise negative.   Physical Exam: Vitals:   02/14/16 0400 02/14/16 0430 02/14/16 0632 02/14/16 0757  BP: (!) 149/77 (!) 149/77  140/71  Pulse: (!) 53     Resp: 17 15  (!) 21  Temp:  (!) 95.1 F (35.1 C) (!) 95.7 F (35.4 C) 97 F (36.1 C)  TempSrc:  Rectal Rectal Axillary  SpO2: 100%   96%  Weight:      Height:         General: Frail, elderly, cachectic  female in NAD Head: Normocephalic, atraumatic, sclera non-icteric, mucus membranes are dry.  Neck: Supple. JVD not elevated. Lungs: Clear bilaterally to auscultation without wheezes, rales, or rhonchi. Breathing is unlabored. Heart: RRR with S1 S2. No murmurs, rubs, or gallops appreciated. SB on monitor.  Abdomen: Soft, non-tender, non-distended with normoactive bowel sounds. No rebound/guarding. No obvious abdominal masses. M-S:  Very weak, grips equal.  Lower extremities:without edema or ischemic changes, no open wounds  Neuro: Alert and oriented X 3. Moves all extremities spontaneously. Psych:  Responds to questions appropriately with a normal affect. Dialysis Access: LUA AVF + bruit  Dialysis Orders: [facility] MWF 3.5 hours 350/Auto 1.5  31.5 Kg UF profile 4 Linear Na Heparin 1400 units IV q treatment  Mircera 50 mcg IV q 4 week (Last HGB 7.9 last ESA dose 02/07/16)  Venofer 50 mg IV weekly (Last Fe 98 02/07/16 Tsat 39 01/24/16)   Assessment/Plan: 1.  Failure to thrive/hypoglycemia: family has met with palliatve care at present. Decision has been made for comfort care and to stop HD. Will go home with hospice. 2. Elevated Troponin: Initial troponin 14.2 now 12.8. No acute EKG changes, denies chest pain. Cardiology recommends comfort care. No interventions.  3. Possible Sepsis/hypothermia: per primary. BC pending. Now normothermic. WBC 11.4, lactic acid 2.0. Vanc/Zosyn per primary. Will start gentle hydration per family request-NS @ 40cc/hr.  4. Hypokalemia: K+ 2.6. Is receiving IV KCL runs per primary 5.  ESRD - MWF [place].  6.  Hypotension/volume  -Patient appears dry. Will have HD today run even, give a liter of NS over the course of HD treatent.  7.  Anemia  - HGB 7.3. Per family request, will transfuse 1 unit of PRBCs. Primary notified.  8.  Metabolic bone disease -  Last incenter Phos 1.9. DC binders. No VDRA. 9.  Nutrition - Albumin 2.4. Severe PCM. Liberalize diet, renal vit/prostat.    Rita H. Owens Shark, NP-C 02/14/2016, 9:29 AM  [employer] [phone]  Pt seen, examined and agree w A/P as above.  Kelly Splinter MD [employer] pager [phone]   02/14/2016, 12:42 PM

## 2016-02-14 NOTE — Consult Note (Signed)
Consultation Note Date: 02/14/2016   Patient Name: Courtney [name]  DOB: 1931/03/19  MRN: [id number]  Age / Sex: 82 y.o., female  PCP: Blanchie Serve, MD Referring Physician: Geradine Girt, DO  Reason for Consultation: Establishing goals of care  HPI/Patient Profile: 82 y.o. female  with past medical history of ESRD (on dialysis), alzheimer dementia, CAD admitted on 02/13/2016 with generalized weakness and failure to thrive. Workup revealed hypotension, hypothermia, elevated troponin, and CBG of 13. Cardiology was consulted and did not recommend any further workup, and encouraged comfort care. Palliative care consulted for goals of care.   Clinical Assessment and Goals of Care: Met with patient's daughter, Malachy Mood, and niece Candace. 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 Family notes she was spunky and very much the matriarch of the family, raising her younger siblings when their mother died at a young age. Family notes patient has been declining over several weeks. Her weight is now 68lbs. She has not been eating. She is not able to complete ADL's. She has advanced dementia that has also been worsening. Family has discussed advance care planning and note DNR/DNI status and no artificial feeding.  Nephrology consulted and recommended one unit PRBC's and gentle hydration for comfort, but at this point, ongoing hemodialysis would not be beneficial. Family is in agreement with transition to comfort care. We discussed likely trajectory of dying process. Family desires to take patient home with Hospice to die in the home. They do not want a hospital death or residential hospice death. They have support and means to care for patient in the home.   Primary Decision Maker NEXT OF KIN- Daughter, Malachy Mood    SUMMARY OF RECOMMENDATIONS -Proceed with 1 unit PRBC and gentle hydration for comfort, then d/c home with  hospice -No hemodialysis -No feeding tube -Oxycodone 2.70m intensol SL q 2hrs prn shortness of breath or pain (avoid morphine due to renal failure likely to cause increase in undesirable side effects) - Robinul .[id number] prn secretions -haldol .[id number] prn agitation -Comfort feeding with regular diet  Code Status/Advance Care Planning:  DNR    Symptom Management:   See above   Palliative Prophylaxis:   Delirium Protocol  Additional Recommendations (Limitations, Scope, Preferences):  Avoid Hospitalization, Full Comfort Care, Minimize Medications, Initiate Comfort Feeding, No Artificial Feeding, No Chemotherapy, No Diagnostics, No Glucose Monitoring, No Hemodialysis, No IV Antibiotics, No IV Fluids, No Lab Draws, No Surgical Procedures and No Tracheostomy  Psycho-social/Spiritual:   Desire for further Chaplaincy support:No   Additional Recommendations: Education on Hospice  Prognosis:    < 2 weeks d/t rapidly declining  Discharge Planning: Home with Hospice  Primary Diagnoses: Present on Admission: . Elevated troponin . [place] stage renal disease ([place] Hypoglycemia . Hypothermia . Advanced dementia   I have reviewed the medical record, interviewed the patient and family, and examined the patient. The following aspects are pertinent.  Past Medical History:  Diagnosis Date  . Alzheimer's dementia   . Anemia due to chronic  renal failure treated with erythropoietin   . CAD in native artery 1997   CABG 1997, Last stress test 10/09 no ischemia  . Carotid disease, bilateral ([place])    Most recent Dopplers April 2014: Right carotid 50-69% (lower range);  lleft less than 50%.  . Dyslipidemia   . ESRD (end stage renal disease) ([place]) On dialysis for ~5 years   [facility] m w f    using ffistula in left upper arm--also has functioning  fisutla in rt upper arm--which has aneurysm and not being used--pt states blood draws from rt hand and b/p's in legs  .  H/O pulmonary fibrosis   . Hx of sickle cell trait   . Hypertension   . Hypertension, renal disease   . Ischemic cardiomyopathy    EF by echo 11/2010 EF 35-45%, LA is moderately dilated, mod. MR, mod. to severe TR, RV systolic pressure 71-06 mmHg  . Moderate mitral regurgitation 11/2010  . Myocardial infarction 12-03-1995  . Osteoarthritis   . Peripheral vascular disease ([place]) 03/2008   Stents to rt SFA, Dopplers from April 2014: No significant change, last PV cath 03/29/08  -- fairly widely patent R. SFA stent. Left SFA less than 50%. Proximal but occluded in the mid vessel with reconstitution distally. Right PT and AP occluded as described previously an angiography. Left AP occluded also noted angiographically   . S/P CABG x 5 1997   LIMA-LAD, SVG-OM, Seq SVG-RPDA-RPL  . Shortness of breath    RELATED TO DIALYSIS -TOO MUCH FLUID  . Stroke, lacunar ([place]) 1994  . Vulvar lesion    Social History   Social History  . Marital status: Single    Spouse name: N/A  . Number of children: 1  . Years of education: N/A   Occupational History  . 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 Smoking status: Former Smoker    Types: Cigarettes    Quit date: 11/01/1991  . Smokeless tobacco: Never Used  . Alcohol use No  . Drug use: No  . Sexual activity: No   Other Topics Concern  . None   Social History Narrative   Single mother of one, grandmother of one. She is not exercising as much as he used to. She used to dance and a lot of walking. But of late she has not been as much. She states that when she turned 36 she became lazy.   She does not smoke and does not drink.   Again we discussed her goals of care and desires as far as resuscitation go. She continues to state that she wants to be DO NOT RESUSCITATE DO NOT INTUBATE, but does not have the form to state that. She also is not interested in invasive evaluation unless directed by symptoms. With that in mind, she is  not in favor of continued noninvasive evaluation.   Family History  Problem Relation Age of Onset  . Heart disease Mother   . Sudden death Mother 47  . Sudden death Father 61  . Stroke Father   . Sudden death Brother 6  . Heart disease Brother   . Heart disease Sister   . Sudden death Sister 48  . Colon cancer Neg Hx   . Anesthesia problems Neg Hx    Scheduled Meds: . carvedilol  12.5 mg Oral BID WC  . feeding supplement (NEPRO CARB STEADY)  237 mL Oral BID BM  . latanoprost  1 drop Both Eyes QHS  . potassium chloride  10 mEq Intravenous Q1 Hr x 4  . sodium chloride flush  3 mL Intravenous Q12H   Continuous Infusions: . sodium chloride 10 mL/hr at 02/13/16 2205   PRN Meds:.acetaminophen **OR** acetaminophen, antiseptic oral rinse, glycopyrrolate **OR** glycopyrrolate **OR** glycopyrrolate, haloperidol **OR** haloperidol **OR** haloperidol lactate, HYDROcodone-acetaminophen, ondansetron **OR** ondansetron (ZOFRAN) IV, oxyCODONE **OR** oxyCODONE, polyvinyl alcohol, senna-docusate Medications Prior to Admission:  Prior to Admission medications   Medication Sig Start Date End Date Taking? Authorizing Provider  acetaminophen (TYLENOL) 325 MG tablet Take 325-650 mg by mouth every 6 (six) hours as needed (for pain). Reported on 11/30/2015   Yes Historical Provider, MD  atorvastatin (LIPITOR) 80 MG tablet Take 1 tablet (80 mg total) by mouth daily. 09/22/15  Yes Leonie Man, MD  carvedilol (COREG) 12.5 MG tablet Take 1 tablet (12.5 mg total) by mouth 2 (two) times daily with a meal. 09/22/15  Yes Leonie Man, MD  clopidogrel (PLAVIX) 75 MG tablet TAKE ONE TABLET BY MOUTH ONCE DAILY Patient taking differently: Take 75 mg by mouth once a day 12/08/15  Yes Leonie Man, MD  latanoprost (XALATAN) 0.005 % ophthalmic solution Place 1 drop into both eyes at bedtime.    Yes Historical Provider, MD  Nutritional Supplements (FEEDING SUPPLEMENT, NEPRO CARB STEADY,) LIQD Take 237 mLs by mouth  2 (two) times daily between meals. 10/03/15  Yes Robbie Lis, MD  senna-docusate (SENOKOT-S) 8.6-50 MG tablet Take 1 tablet by mouth at bedtime as needed for mild constipation. 10/03/15  Yes Robbie Lis, MD   Allergies  Allergen Reactions  . Aspirin Itching  . Pletal [Cilostazol] Other (See Comments)    DIZZINESS   Review of Systems  Unable to perform ROS: Dementia    Physical Exam  Constitutional: No distress.  cachexic   Cardiovascular: Regular rhythm, normal heart sounds and intact distal pulses.   tachycardic  Pulmonary/Chest: Effort normal and breath sounds normal.  Abdominal: Soft. Bowel sounds are normal.  Musculoskeletal:  Generalized weakness  Neurological: She is alert.  Skin: Skin is warm and dry.  Psychiatric:  Pleasantly confused, mumbling incoherently at times    Vital Signs: BP 140/71 (BP Location: Right Leg)   Pulse (!) 53   Temp 97 F (36.1 C) (Axillary)   Resp (!) 21   Ht '5\' 3"'  (1.6 m)   Wt 31.3 kg (69 lb)   SpO2 96%   BMI 12.22 kg/m  Pain Assessment: Faces   Pain Score: 0-No pain   SpO2: SpO2: 96 % O2 Device:SpO2: 96 % O2 Flow Rate: .O2 Flow Rate (L/min): 4 L/min  IO: Intake/output summary:  Intake/Output Summary (Last 24 hours) at 02/14/16 1055 Last data filed at 02/14/16 0400  Gross per 24 hour  Intake           719.17 ml  Output                0 ml  Net           719.17 ml    LBM: Last BM Date: 02/12/16 Baseline Weight: Weight: 31.8 kg (70 lb) Most recent weight: Weight: 31.3 kg (69 lb)     Palliative Assessment/Data:     Thank you for this consult. Palliative medicine will continue to follow and assist as needed.   Time In: 1000  Time Out: 1115  Time Total: 75 minutes Greater than 50%  of this time was spent counseling and coordinating  care related to the above assessment and plan.  Signed by: Mariana Kaufman, AGNP-C Palliative Medicine    Please contact Palliative Medicine Team phone at [phone] for questions and  concerns.  For individual provider: See Shea Evans

## 2016-02-14 NOTE — Discharge Summary (Signed)
Physician Discharge Summary  Courtney [name] ZOX:[id number] DOB: Oct 17, 1930 DOA: 02/13/2016  PCP: Oneal GroutPANDEY, MAHIMA, MD  Admit date: 02/13/2016 Discharge date: 02/14/2016   Recommendations for Outpatient Follow-Up:   1. Home with hospice   Discharge Diagnosis:   Principal Problem:   Elevated troponin Active Problems:   End stage renal disease (HCC)   Hypoglycemia   Hypothermia   Advanced dementia   Discharge disposition:  Home with hospice  Discharge Condition: Improved.  Diet recommendation:   Regular.  Wound care: None.   History of Present Illness:   Courtney [name] is a 82 y.o. female with medical history significant of ESRD, Alzheimer dementia and CAD. Patient came to the hospital because of generalized weakness and failure to thrive. Per daughter patient was not doing very well for the past 2 weeks, not able to eat or drink much, she had drop in her hemoglobin less than she had dialysis on Friday 9/29, skipped dialysis last Monday because of weakness and unable to get up. She brought to the hospital because of generalized weakness, she was found to have temperature of 92.4, was hypoxic, blood pressure 91/49, CBG was 13, and troponin of 14.2.  Hospital Course by Problem:   Family notes patient has been declining over several weeks. Her weight is now 68lbs. She has not been eating. She is not able to complete ADL's. She has advanced dementia that has also been worsening. Family has discussed advance care planning and note DNR/DNI status and no artificial feeding.  Nephrology consulted and recommended one unit PRBC's and gentle hydration for comfort, but at this point, ongoing hemodialysis would not be beneficial. Family is in agreement with transition to comfort care.    Medical Consultants:    Renal  Palliative care   Discharge Exam:   Vitals:   02/14/16 0757 02/14/16 1153  BP: 140/71 (!) 127/114  Pulse:  (!) 52  Resp: (!) 21 (!) 22  Temp: 97 F (36.1  C) 97.4 F (36.3 C)   Vitals:   02/14/16 0430 02/14/16 0632 02/14/16 0757 02/14/16 1153  BP: (!) 149/77  140/71 (!) 127/114  Pulse:    (!) 52  Resp: 15  (!) 21 (!) 22  Temp: (!) 95.1 F (35.1 C) (!) 95.7 F (35.4 C) 97 F (36.1 C) 97.4 F (36.3 C)  TempSrc: Rectal Rectal Axillary Oral  SpO2:   96% 100%  Weight:      Height:        Gen:  NAD-- pleasantly confused   The results of significant diagnostics from this hospitalization (including imaging, microbiology, ancillary and laboratory) are listed below for reference.     Procedures and Diagnostic Studies:   Dg Chest Portable 1 View  Result Date: 02/13/2016 CLINICAL DATA:  Weakness, coronary artery disease, smoking history EXAM: PORTABLE CHEST 1 VIEW COMPARISON:  Chest x-ray of 02/01/2016 FINDINGS: The lungs appear clear but hyperaerated. No pneumonia or effusion is seen. Mediastinal and hilar contours are unremarkable. The heart is mildly enlarged and stable. Median sternotomy sutures are present from prior CABG. The bones are osteopenic and there are degenerative changes in the shoulders left-greater-than-right. IMPRESSION: Hyperaeration.  No active lung disease.  Mild cardiomegaly. Electronically Signed   By: Dwyane DeePaul  Barry M.D.   On: 02/13/2016 14:57     Labs:   Basic Metabolic Panel:  Recent Labs Lab 02/13/16 1523 02/13/16 1602 02/14/16 0401  NA 142 141 139  K 3.2* 3.5 2.6*  CL 103 101 100*  CO2  26  --  26  GLUCOSE 266* 90 116*  BUN 37* 40* 40*  CREATININE 4.97* 4.90* 5.13*  CALCIUM 7.8*  --  8.1*   GFR Estimated Creatinine Clearance: 4.1 mL/min (by C-G formula based on SCr of 5.13 mg/dL (H)). Liver Function Tests: No results for input(s): AST, ALT, ALKPHOS, BILITOT, PROT, ALBUMIN in the last 168 hours. No results for input(s): LIPASE, AMYLASE in the last 168 hours. No results for input(s): AMMONIA in the last 168 hours. Coagulation profile  Recent Labs Lab 02/13/16 2019  INR 1.05    CBC:  Recent  Labs Lab 02/13/16 1523 02/13/16 1602 02/14/16 0401  WBC 10.6*  --  11.4*  NEUTROABS 9.7*  --   --   HGB 7.7* 8.8* 7.3*  HCT 23.7* 26.0* 22.0*  MCV 79.0  --  79.1  PLT 83*  --  78*   Cardiac Enzymes:  Recent Labs Lab 02/14/16 0819  TROPONINI 12.83*   BNP: Invalid input(s): POCBNP CBG:  Recent Labs Lab 02/13/16 1802 02/14/16 0113 02/14/16 0433 02/14/16 0755 02/14/16 1156  GLUCAP 134* 133* 117* 107* 131*   D-Dimer No results for input(s): DDIMER in the last 72 hours. Hgb A1c No results for input(s): HGBA1C in the last 72 hours. Lipid Profile No results for input(s): CHOL, HDL, LDLCALC, TRIG, CHOLHDL, LDLDIRECT in the last 72 hours. Thyroid function studies  Recent Labs  02/13/16 2019  TSH 56.155*   Anemia work up No results for input(s): VITAMINB12, FOLATE, FERRITIN, TIBC, IRON, RETICCTPCT in the last 72 hours. Microbiology Recent Results (from the past 240 hour(s))  MRSA PCR Screening     Status: None   Collection Time: 02/13/16  6:50 PM  Result Value Ref Range Status   MRSA by PCR NEGATIVE NEGATIVE Final    Comment:        The GeneXpert MRSA Assay (FDA approved for NASAL specimens only), is one component of a comprehensive MRSA colonization surveillance program. It is not intended to diagnose MRSA infection nor to guide or monitor treatment for MRSA infections.      Discharge Instructions:   Discharge Instructions    Diet general    Complete by:  As directed    Discharge instructions    Complete by:  As directed    Hospice at home   Increase activity slowly    Complete by:  As directed        Medication List    STOP taking these medications   atorvastatin 80 MG tablet Commonly known as:  LIPITOR   senna-docusate 8.6-50 MG tablet Commonly known as:  Senokot-S     TAKE these medications   acetaminophen 325 MG tablet Commonly known as:  TYLENOL Take 325-650 mg by mouth every 6 (six) hours as needed (for pain). Reported on  11/30/2015   carvedilol 12.5 MG tablet Commonly known as:  COREG Take 1 tablet (12.5 mg total) by mouth 2 (two) times daily with a meal.   clopidogrel 75 MG tablet Commonly known as:  PLAVIX TAKE ONE TABLET BY MOUTH ONCE DAILY What changed:  See the new instructions.   feeding supplement (NEPRO CARB STEADY) Liqd Take 237 mLs by mouth 2 (two) times daily between meals.   glycopyrrolate 1 MG tablet Commonly known as:  ROBINUL Take 1 tablet (1 mg total) by mouth every 4 (four) hours as needed (excessive secretions).   haloperidol 0.5 MG tablet Commonly known as:  HALDOL Take 1 tablet (0.5 mg total) by mouth every 4 (  four) hours as needed for agitation (or delirium).   latanoprost 0.005 % ophthalmic solution Commonly known as:  XALATAN Place 1 drop into both eyes at bedtime.   oxyCODONE 20 MG/ML concentrated solution Commonly known as:  ROXICODONE INTENSOL Take 0.1 mLs (2 mg total) by mouth every 2 (two) hours as needed for moderate pain (or dyspnea).         Time coordinating discharge: 35 min  Signed:  Parth Mccormac U Charina Fons   [facility] 02/14/2016, 12:08 PM

## 2016-02-14 NOTE — Progress Notes (Signed)
The client had a morning rectal temperature of 95.1 fahrenheit. Notified Schorr and I added extra blankets and turned the room temperature all the way up. I will retake the temperature and monitor the client closely.   Sheppard Evensina Sharrie Self RN

## 2016-02-14 NOTE — Progress Notes (Signed)
Notified by Steward DroneBrenda, [facility] of family request [facility] after discharge. Chart and patient information currently under review to confirm hospice eligibility.   Spoke with Elnita MaxwellCheryl (daughter) and Drue FlirtCandy (niece), at bedside to initiate education related to hospice philosophy, services and team approach to care. Family verbalized understanding of the information provided. Per discussion plan is for discharge to home by ambulance today (family is to call staff RN when oxygen is delivered to the home).     Please send signed completed DNR form home with patient. Patient will need prescriptions for discharge comfort medications.   DME needs discussed and family requested oxygen only at this time.  [facility] Research scientist (physical sciences)equipment manager, Loistine SimasJewel Hughes, notified and will contact [facility] to arrange delivery to the home. The home address has been verified and is correct in the chart; Elnita MaxwellCheryl (daughter) to be contacted to arrange time of delivery.   [facility] Referral Center aware of the above. Completed discharge summary will need to be faxed to [facility] at [phone] when final. Please notify [facility] when patient is ready to leave unit at discharge-call [phone].     [facility] information and contact numbers have been given to [place] during visit. Above information shared with Steward DroneBrenda, [facility]. Please call with any questions.  Kristine GarbeWendy Slingerland, RN [phone]

## 2016-02-14 NOTE — Care Management Note (Addendum)
Case Management Note  Patient Details  Name: Courtney [name] MRN: [id number] Date of Birth: [birth year]  Subjective/Objective: Pt presented for Generalized weakness/failure to thrive. Palliative Care Consulted and the plan will be for home with Hospice Services. Pt is from home with support of family.             Action/Plan: CM did speak with daughter in regards to hospice services. Agency list provided and family chose [facility]. CM did make referral and SOC to begin within 24 hours of d/c. [facility] will assist with Durable Medical Equipment. CM did speak with family in regards to transportation home. If pt will not need 02, per family they can transport via car. Pt may need ambulance transport. CM will speak with family after Hospice talks to them. No further needs at this time.   Expected Discharge Date:                  Expected Discharge Plan:  Home w Hospice Care  In-House Referral:  Hospice / Palliative Care  Discharge planning Services  CM Consult  Post Acute Care Choice:  Hospice Choice offered to:  Adult Children  DME Arranged:  N/A ([facility] to set up) DME Agency:  NA ([facility] to set up)  HH Arranged:  RN HH Agency:  [facility]  Status of Service:  Completed, signed off  If discussed at Long Length of Stay Meetings, dates discussed:    Additional Comments: Family wants [facility] for transportation home. Pt will need 02 in transport. [facility] will send a RN out tonight for admission. CM did fax Discharge Summary to office. Staff RN to call [facility] at time of d/c then call the oncall # for Hospice. No further needs from CM at this time. 1509 02-14-16.  Gala LewandowskyGraves-Bigelow, Cameo Schmiesing Kaye, RN 02/14/2016, 12:27 PM

## 2016-02-14 NOTE — Progress Notes (Signed)
The [name] daughters home. I [facility] and their nurse will be meeting them at the resident. [facility] with discharge paperwork.   Sheppard Evensina Claudett Bayly RN

## 2016-02-14 NOTE — Care Management Obs Status (Signed)
MEDICARE OBSERVATION STATUS NOTIFICATION   Patient Details  Name: Courtney [name] MRN: [id number] Date of Birth: [birth year]   Medicare Observation Status Notification Given:  Yes    Gala LewandowskyGraves-Bigelow, Nael Petrosyan Kaye, RN 02/14/2016, 3:04 PM

## 2016-02-14 NOTE — Progress Notes (Signed)
CRITICAL VALUE ALERT  Critical value received:  Troponin 12.83  Date of notification:  02/14/2016  Time of notification:  0932  Critical value read back:Yes.    Nurse who received alert:  Raymon MuttonGwen Roosevelt RN  MD notified (1st page):  Dr Benjamine MolaVann  Time of first page:  0935  MD notified (2nd page):  Time of second page:  Responding MD:  Dr Benjamine MolaVann  Time MD responded:  [phone]

## 2016-02-14 NOTE — Progress Notes (Signed)
CRITICAL VALUE ALERT  Critical value received:  K 2.6  Also notified for Hgb 7.3  Date of notification:  02/14/2016  Time of notification:  0537  Critical value read back:Yes.    Nurse who received alert:  Sheppard Evensina Julieth Tugman  MD notified (1st page):  Schorr  Time of first page:  [phone]  MD notified (2nd page):  Time of second page:  Responding MD:  Schorr  Time MD responded: [phone]  Ordered runs of IV potassium

## 2016-02-15 LAB — TYPE AND SCREEN
ABO/RH(D): AB POS
ANTIBODY SCREEN: NEGATIVE
UNIT DIVISION: 0

## 2016-02-16 LAB — HEMOGLOBIN A1C
Hgb A1c MFr Bld: <4.2 % — ABNORMAL LOW (ref 4.8–5.6)
Mean Plasma Glucose: <74 mg/dL

## 2016-02-16 MED FILL — OXYCODONE HCL 100 MG/5 ML S: 100 | 10 days supply | Qty: 30 | Fill #0

## 2016-02-18 LAB — CULTURE, BLOOD (ROUTINE X 2)
CULTURE: NO GROWTH
Culture: NO GROWTH

## 2016-02-28 DIAGNOSIS — N186 End stage renal disease: Secondary | ICD-10-CM

## 2016-02-28 DIAGNOSIS — Z7189 Other specified counseling: Secondary | ICD-10-CM

## 2016-02-28 DIAGNOSIS — Z515 Encounter for palliative care: Secondary | ICD-10-CM

## 2016-03-13 DEATH — deceased

## 2017-09-17 IMAGING — CR DG HIP (WITH OR WITHOUT PELVIS) 2-3V*L*
3 series · 3 of 3 positions shown · non-contrast
Comparison: None.

CLINICAL DATA: Fall, left hip deformity

EXAM:
DG HIP (WITH OR WITHOUT PELVIS) 2-3V LEFT

[pelvis ap]
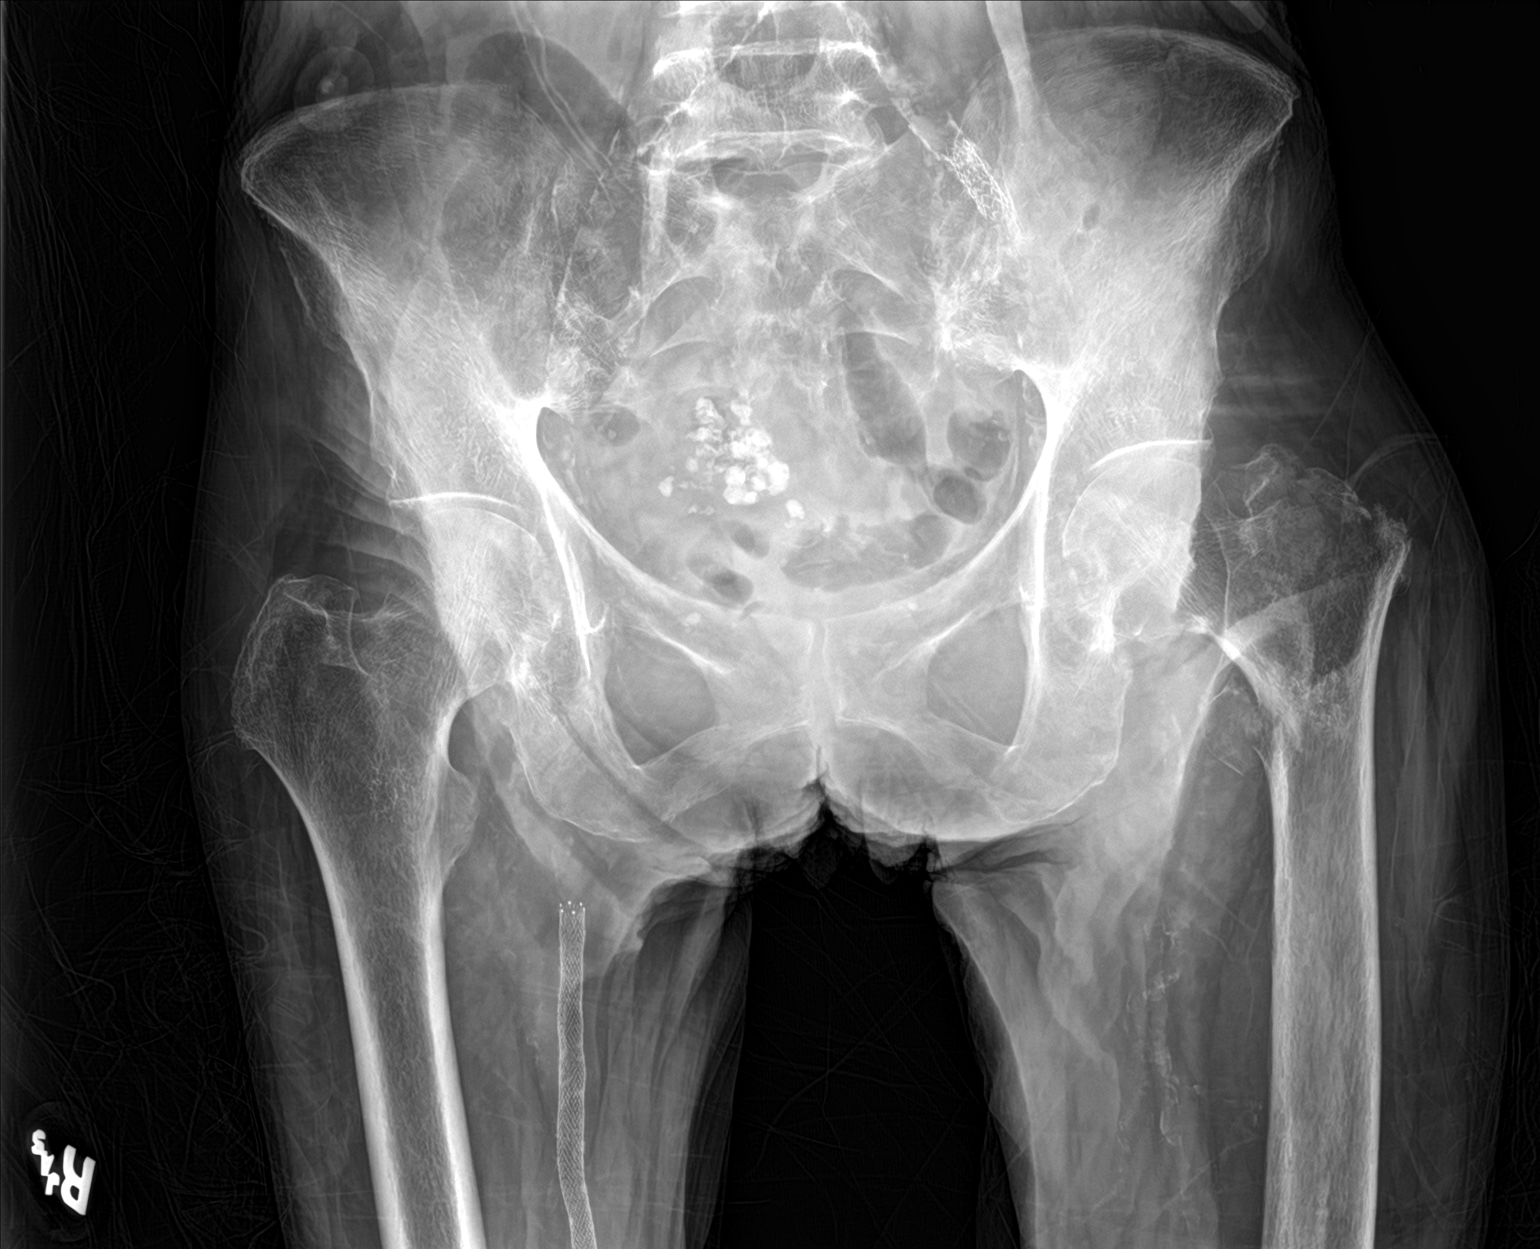

[hip ap]
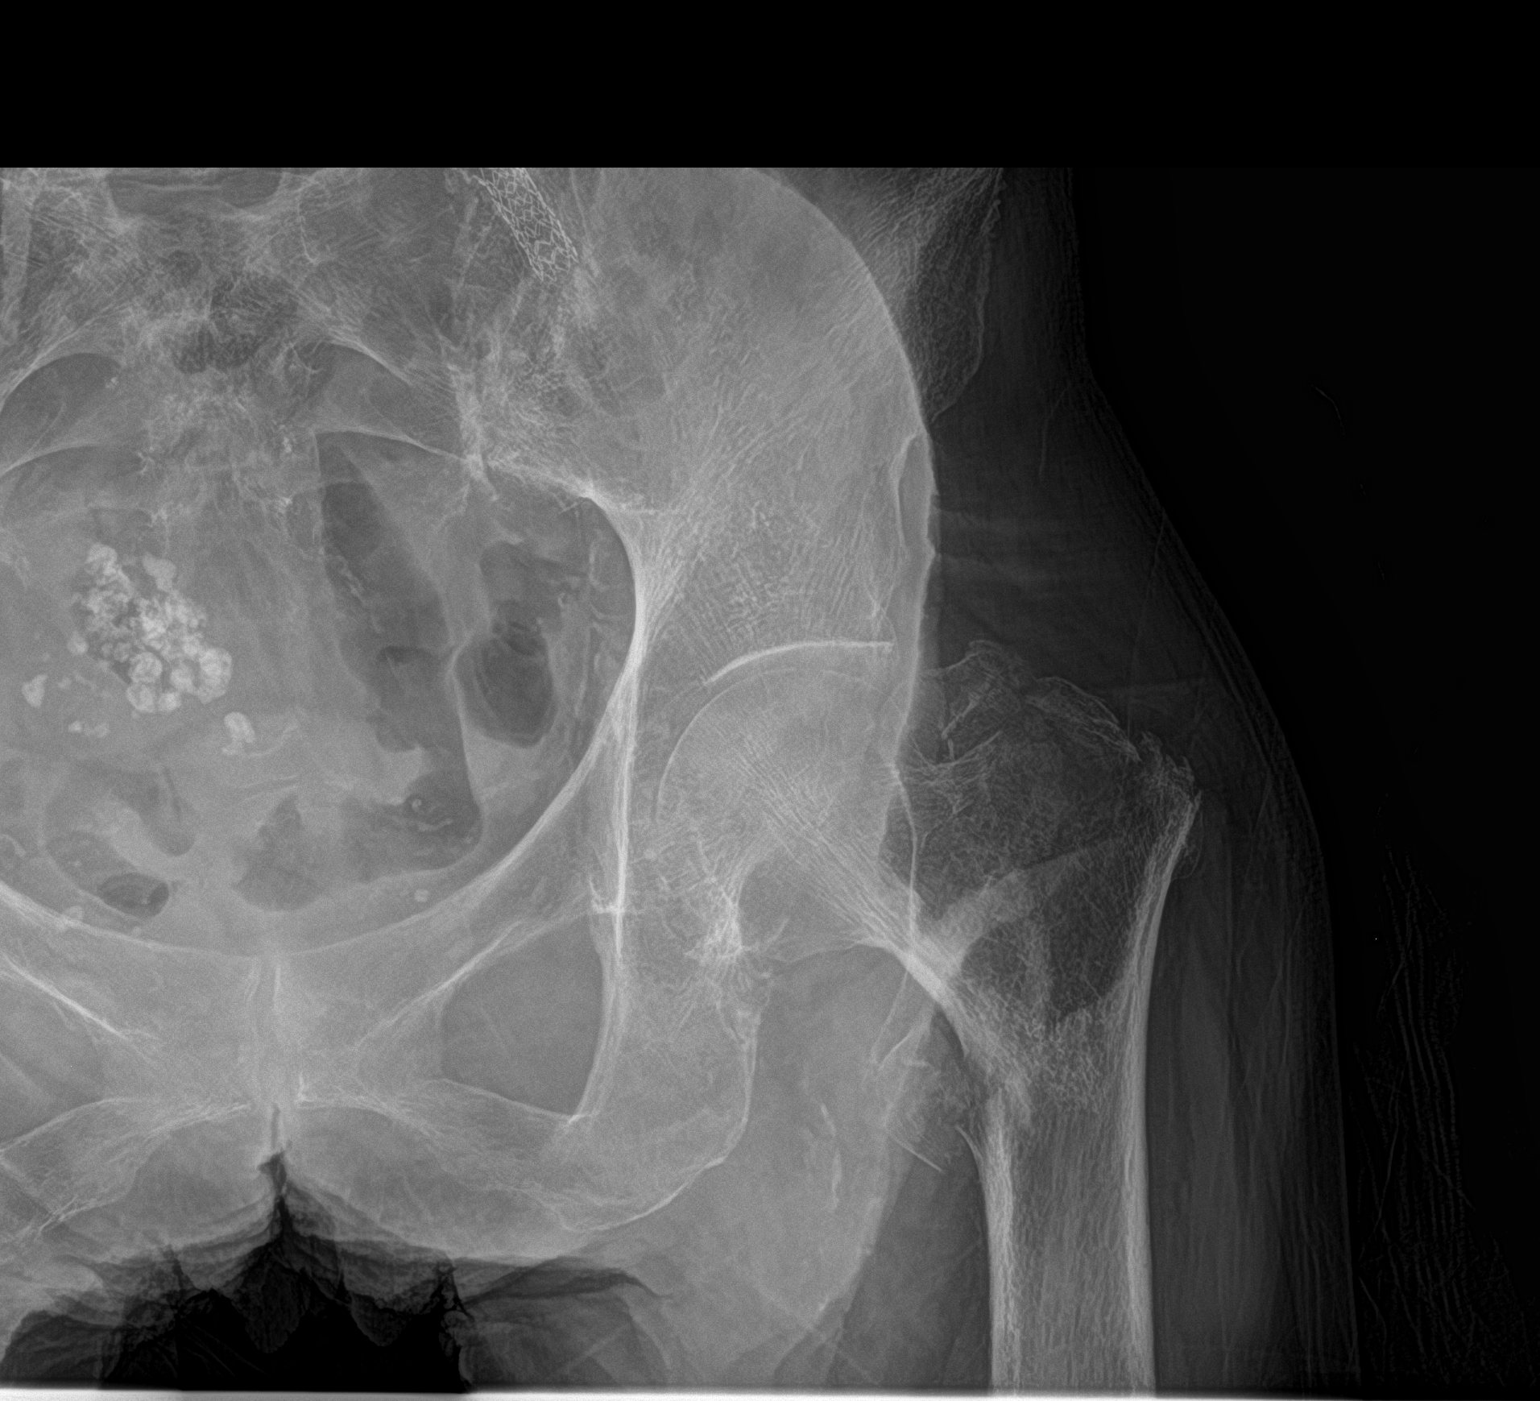

[hip x-table]
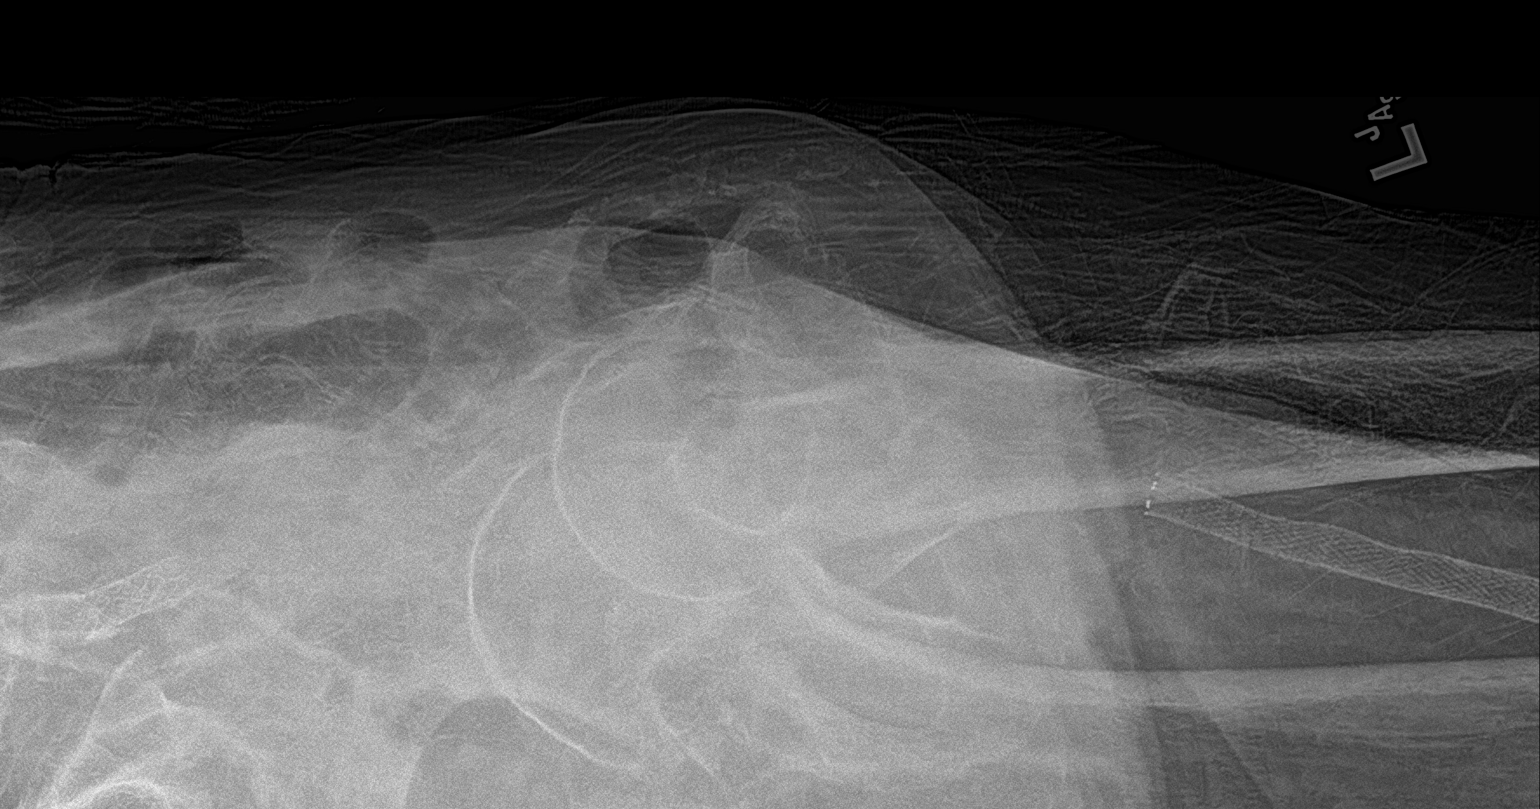

[3 of 3 positions shown; findings below may reference images not displayed]

FINDINGS: Intertrochanteric left hip fracture. Varus angulation with
foreshortening.

Bilateral hip joint spaces are preserved.

Bony pelvis appears intact.

Vascular calcifications. Left iliac stent. Additional vascular stent
in the right medial thigh.
IMPRESSION: Intertrochanteric left hip fracture, as above.

## 2018-01-19 IMAGING — DX DG CHEST 2V
2 series · 2 of 2 positions shown · non-contrast
Comparison: 09/30/2015

CLINICAL DATA: Fatigue and hypotension.

EXAM:
CHEST  2 VIEW

[chest lat]
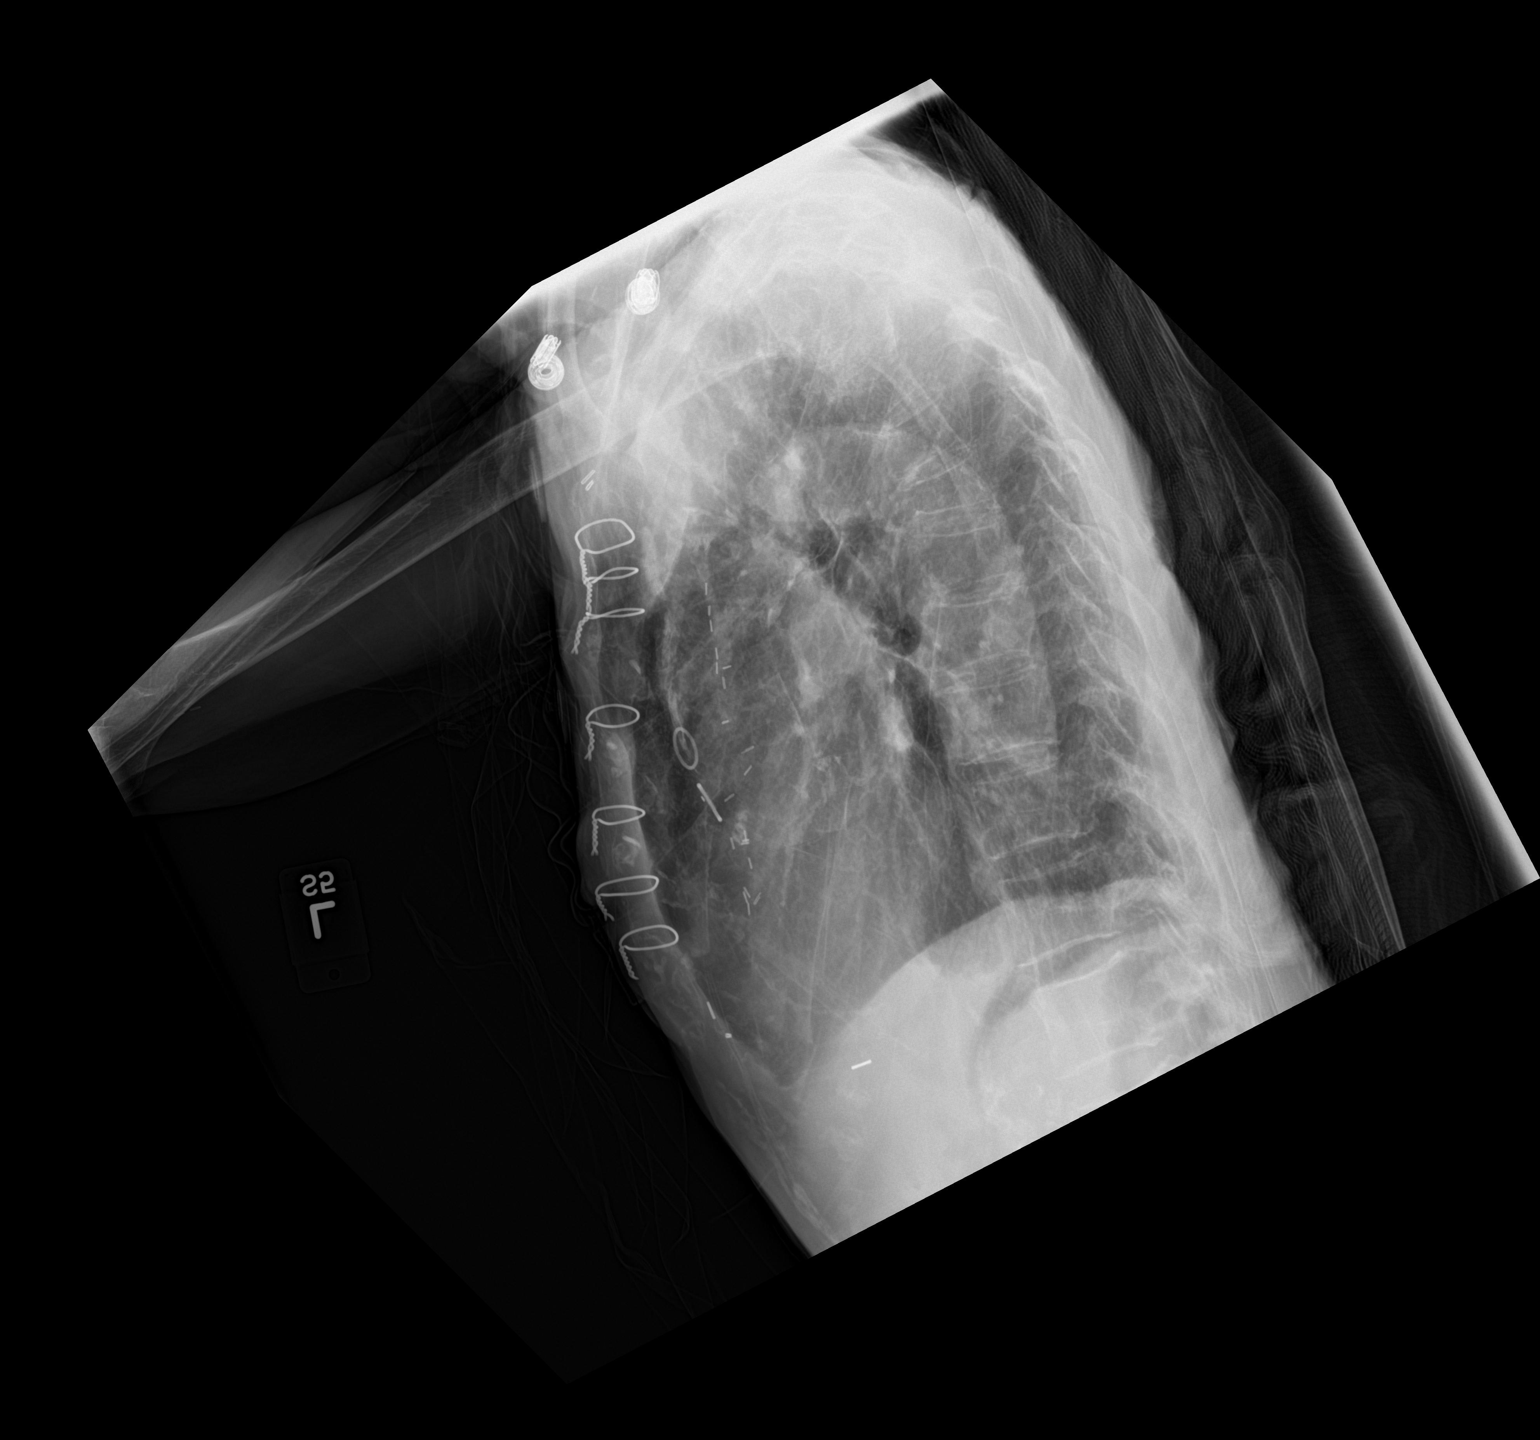

[chest ap]
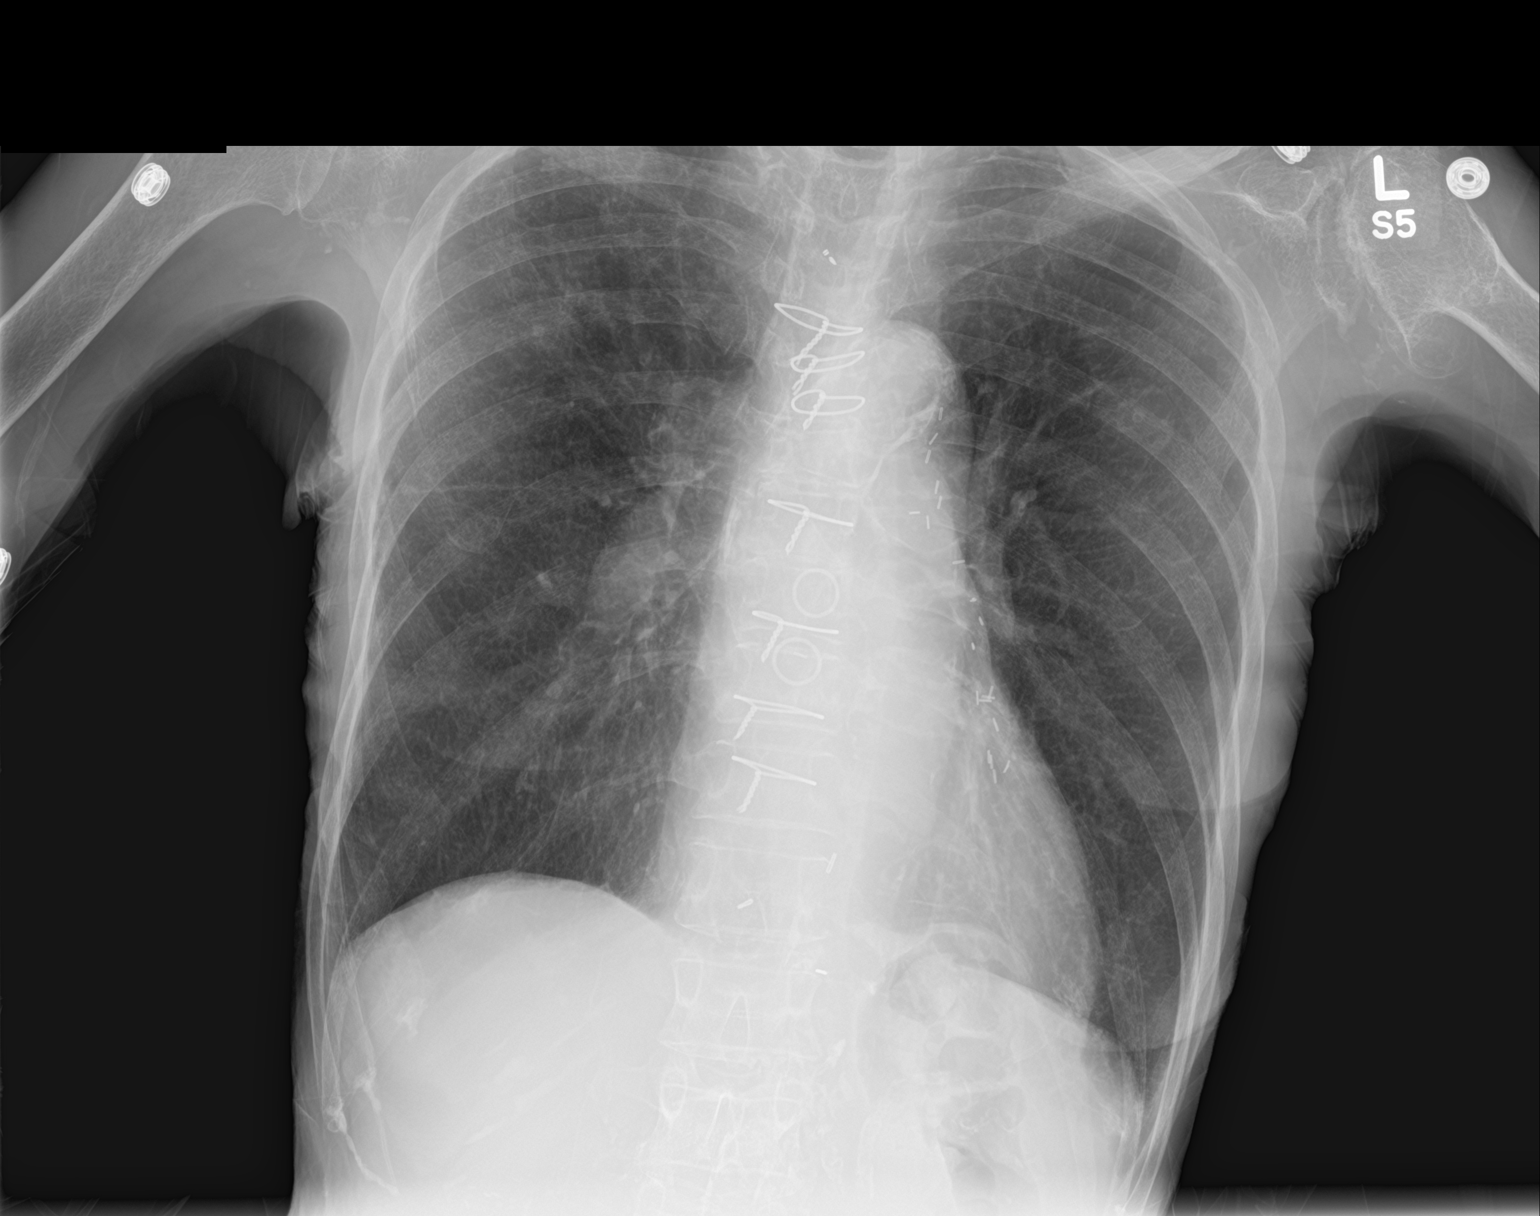

[2 of 2 positions shown; findings below may reference images not displayed]

FINDINGS: The cardiac silhouette, mediastinal and hilar contours are within
normal limits and stable. Stable surgical changes from bypass
surgery. Stable tortuosity and calcification of the thoracic aorta.
Advanced emphysematous changes are again demonstrated with areas of
pulmonary scarring. No acute overlying pulmonary process. Artifact
noted from overlying EKG leads. The bony thorax is intact. Remote
mid thoracic compression fracture.
IMPRESSION: Emphysematous changes and pulmonary scarring but no acute overlying
pulmonary process.
